# Patient Record
Sex: Female | Born: 1970 | Race: White | Hispanic: No | Smoking: Former smoker
Health system: Southern US, Community
[De-identification: ages and names within clinical notes are randomized; demographics above are authoritative.]

## PROBLEM LIST (undated history)

## (undated) DIAGNOSIS — M109 Gout, unspecified: Secondary | ICD-10-CM

## (undated) DIAGNOSIS — I639 Cerebral infarction, unspecified: Secondary | ICD-10-CM

## (undated) DIAGNOSIS — M1990 Unspecified osteoarthritis, unspecified site: Secondary | ICD-10-CM

## (undated) DIAGNOSIS — K2970 Gastritis, unspecified, without bleeding: Secondary | ICD-10-CM

## (undated) DIAGNOSIS — K589 Irritable bowel syndrome without diarrhea: Secondary | ICD-10-CM

## (undated) DIAGNOSIS — F419 Anxiety disorder, unspecified: Secondary | ICD-10-CM

## (undated) DIAGNOSIS — F32A Depression, unspecified: Secondary | ICD-10-CM

## (undated) DIAGNOSIS — K219 Gastro-esophageal reflux disease without esophagitis: Secondary | ICD-10-CM

## (undated) HISTORY — PX: AUGMENTATION MAMMAPLASTY: SUR837

## (undated) HISTORY — PX: COSMETIC SURGERY: SHX468

## (undated) HISTORY — PX: EXTERNAL FIXATION LEG: SHX1549

---

## 2011-01-26 ENCOUNTER — Emergency Department (HOSPITAL_COMMUNITY): Payer: BC Managed Care – PPO

## 2011-01-26 ENCOUNTER — Inpatient Hospital Stay (HOSPITAL_COMMUNITY)
Admission: EM | Admit: 2011-01-26 | Discharge: 2011-01-30 | DRG: 175 | Discharge status: Against Medical Advice | Payer: BC Managed Care – PPO | Attending: Internal Medicine | Admitting: Internal Medicine

## 2011-01-26 DIAGNOSIS — F411 Generalized anxiety disorder: Secondary | ICD-10-CM | POA: Diagnosis present

## 2011-01-26 DIAGNOSIS — K5289 Other specified noninfective gastroenteritis and colitis: Secondary | ICD-10-CM | POA: Diagnosis present

## 2011-01-26 DIAGNOSIS — F17200 Nicotine dependence, unspecified, uncomplicated: Secondary | ICD-10-CM | POA: Diagnosis present

## 2011-01-26 DIAGNOSIS — K922 Gastrointestinal hemorrhage, unspecified: Principal | ICD-10-CM | POA: Diagnosis present

## 2011-01-26 DIAGNOSIS — D649 Anemia, unspecified: Secondary | ICD-10-CM | POA: Diagnosis present

## 2011-01-26 LAB — BASIC METABOLIC PANEL
Calcium: 9 mg/dL (ref 8.4–10.5)
Chloride: 105 mEq/L (ref 96–112)
Creatinine, Ser: 0.67 mg/dL (ref 0.4–1.2)
GFR calc Af Amer: >60 mL/min (ref >60)
Sodium: 138 mEq/L (ref 135–145)

## 2011-01-26 LAB — HEMOGLOBIN AND HEMATOCRIT, BLOOD: Hemoglobin: 11.1 g/dL — ABNORMAL LOW (ref 12.0–15.0)

## 2011-01-26 LAB — DIFFERENTIAL
Eosinophils Absolute: 0.4 10*3/uL (ref 0.0–0.7)
Lymphocytes Relative: 14 % (ref 12–46)
Lymphs Abs: 1.4 10*3/uL (ref 0.7–4.0)
Monocytes Relative: 4 % (ref 3–12)
Neutro Abs: 7.5 10*3/uL (ref 1.7–7.7)
Neutrophils Relative %: 78 % — ABNORMAL HIGH (ref 43–77)

## 2011-01-26 LAB — CBC
Hemoglobin: 13.5 g/dL (ref 12.0–15.0)
MCV: 89 fL (ref 78.0–100.0)
Platelets: 263 10*3/uL (ref 150–400)
RBC: 4.1 MIL/uL (ref 3.87–5.11)
WBC: 9.7 10*3/uL (ref 4.0–10.5)

## 2011-01-26 LAB — ABO/RH: ABO/RH(D): O POS

## 2011-01-26 LAB — OCCULT BLOOD, POC DEVICE: Fecal Occult Bld: POSITIVE

## 2011-01-26 LAB — POCT PREGNANCY, URINE: Preg Test, Ur: NEGATIVE

## 2011-01-26 LAB — TYPE AND SCREEN

## 2011-01-26 MED ORDER — IOHEXOL 300 MG/ML  SOLN
100.0000 mL | Freq: Once | INTRAMUSCULAR | Status: AC | PRN
  Administered 2011-01-26: 100 mL via INTRAVENOUS

## 2011-01-27 LAB — TSH: TSH: 1.629 u[IU]/mL (ref 0.350–4.500)

## 2011-01-27 LAB — RENAL FUNCTION PANEL
Albumin: 3 g/dL — ABNORMAL LOW (ref 3.5–5.2)
BUN: 7 mg/dL (ref 6–23)
CO2: 25 mEq/L (ref 19–32)
Calcium: 8 mg/dL — ABNORMAL LOW (ref 8.4–10.5)
Chloride: 109 mEq/L (ref 96–112)
Creatinine, Ser: 0.76 mg/dL (ref 0.4–1.2)
GFR calc Af Amer: >60 mL/min (ref >60)
GFR calc non Af Amer: >60 mL/min (ref >60)
Glucose, Bld: 103 mg/dL — ABNORMAL HIGH (ref 70–99)
Phosphorus: 3 mg/dL (ref 2.3–4.6)
Potassium: 3.8 mEq/L (ref 3.5–5.1)
Sodium: 138 mEq/L (ref 135–145)

## 2011-01-27 LAB — HEMOGLOBIN AND HEMATOCRIT, BLOOD: HCT: 28.3 % — ABNORMAL LOW (ref 36.0–46.0)

## 2011-01-28 DIAGNOSIS — K625 Hemorrhage of anus and rectum: Secondary | ICD-10-CM

## 2011-01-28 DIAGNOSIS — R1011 Right upper quadrant pain: Secondary | ICD-10-CM

## 2011-01-28 DIAGNOSIS — R197 Diarrhea, unspecified: Secondary | ICD-10-CM

## 2011-01-28 DIAGNOSIS — R933 Abnormal findings on diagnostic imaging of other parts of digestive tract: Secondary | ICD-10-CM

## 2011-01-28 LAB — CBC
Hemoglobin: 10.1 g/dL — ABNORMAL LOW (ref 12.0–15.0)
RBC: 3.15 MIL/uL — ABNORMAL LOW (ref 3.87–5.11)

## 2011-01-30 ENCOUNTER — Other Ambulatory Visit: Payer: Self-pay | Admitting: Gastroenterology

## 2011-01-30 LAB — FECAL LACTOFERRIN, QUANT: Fecal Lactoferrin: POSITIVE

## 2011-01-30 LAB — CLOSTRIDIUM DIFFICILE BY PCR: Toxigenic C. Difficile by PCR: NEGATIVE

## 2011-02-23 NOTE — H&P (Signed)
Gloria Aguilar, Gloria Aguilar                 ACCOUNT NO.:  [id number]  MEDICAL RECORD NO.:  [id number]           PATIENT TYPE:  E  LOCATION:  [facility]                         FACILITY:  [facility]  PHYSICIAN:  Altha Harm, MDDATE OF BIRTH:  1971/06/15  DATE OF ADMISSION:  01/26/2011 DATE OF DISCHARGE:                             HISTORY & PHYSICAL   CHIEF COMPLAINT:  Hematochezia.  HISTORY OF PRESENT ILLNESS:  Gloria Aguilar is a 40 year old white female with a 2-day history of bright red blood per rectum.  The patient reports that she has had approximately 30 episodes of blood from the rectum whenever drinking any liquids or taken in any food.  The patient denies any fever or chills.  No nausea or vomiting.  She does report associated abdominal cramps.  She states that it was most intense when she had a piece of bread.  The patient states that since she has continued to have the bleeding per rectum, she has essentially stopped any oral intake.  The patient has not had any bloody stools and she has been here in the emergency room this morning.  The patient is unable to quantify the amount of blood.  However, her hemoglobin is relatively stable here at 13.5.  She has had no dizziness.  No loss of consciousness.  No seizure.  No syncope.  No chest pain.  No dysuria. She did state that after having several episodes of the blood per rectum, she felt generally weak.  PAST MEDICAL HISTORY:  Not significant for any chronic illnesses.  FAMILY HISTORY:  Significant for diverticulitis in her brother.  SOCIAL HISTORY:  She lives with her husband.  There is tobacco use of one and a half packs per day x23 years.  She occasionally uses alcohol socially and there is no illicit drug use.  CURRENT MEDICATIONS:  The patient has no current prescribed medications, but did take one Percocet at home from a previous prescription that she had to alleviate her pain and she also takes Tylenol.  ALLERGIES:  No known  drug allergies.  PRIMARY CARE PHYSICIAN:  The patient does not have a primary care physician and is unassigned.  REVIEW OF SYSTEMS:  All other systems negative.  STUDIES:  In the emergency room, a hemogram shows a white blood cell count of 9.7, hemoglobin of 13.5, hematocrit of 36.5, platelet count of 263.  Sodium 138, potassium 3.7, chloride 105, bicarb 24, BUN 11, creatinine 0.67.  Urine HCG is negative and the fecal occult blood test was positive.  CT of the abdomen and pelvis shows wall thickening in the descending colon and sigmoid colon, suspicious for nonspecific colitis. Dependent linear subsegmental atelectasis in the lung bases.  PHYSICAL EXAMINATION:  GENERAL:  The patient is a well-nourished, well- developed in no acute distress. VITAL SIGNS:  Temperature is 98.2, heart rate 105, blood pressure 112/76, respiratory rate 20, O2 sats are 98% on room air. HEENT:  She is normocephalic, atraumatic.  Pupils are equally round and reactive to light and accommodation.  Extraocular movements are intact. Oropharynx is moist.  No exudate, erythema, or lesions are noted. NECK:  Trachea is midline.  No masses.  No thyromegaly.  No JVD.  No carotid bruit. LUNGS:  Clear to auscultation.  No wheezing or rhonchi.  No accessory muscle use.  She has equal excursions bilaterally. CARDIOVASCULAR:  She has got a normal S1 and S2.  No murmurs, rubs, or gallops are noted.  PMI is nondisplaced.  No heaves or thrills on palpation. ABDOMEN:  Obese, soft, tenderness in the right and left lower quadrants. No masses.  No hepatosplenomegaly. NEUROLOGICAL:  She has got no focal neurological deficits.  Cranial nerves II through XII are grossly intact.  DTRs are 2+ bilaterally in the upper and lower extremities.  Strength is 4+/5 in bilateral upper and lower extremities. MUSCULOSKELETAL:  She has got no warmth, swelling or erythema around the joints. SKIN:  The patient has no rashes evident on her  skin. LYMPH NODE SURVEY:  She has got no cervical, axillary, or inguinal lymphadenopathy noted. PSYCHIATRIC:  She is alert and oriented x3, good insight and cognition, good recent to remote recall.  ASSESSMENT AND PLAN:  This a patient who presents with what appears to be acute colitis.  The differential includes inflammatory versus ischemia, less likely infectious.  I spoke with Dr. Christella Hartigan from [facility] who will see the patient in consultation.  In the meantime, we will get stools for cultures to look for enterohemorrhagic pathogens and also stool for C. diff.  The patient does not have any other constitutional signs of infection, thus I doubt that this would be infection.  I would expect the patient to be a lot more ill appearing if this were enterohemorrhagic.  The patient will be started on Flagyl and ciprofloxacin and I will defer any further management of this to Dr. Christella Hartigan at GI.  We will start the patient on clear liquids in the event that they want to do an endoscopy on her.  If they decide not to do endoscopy, I suspect that we can advance her diet as tolerated to a low- residue diet.  In terms of DVT prophylaxis, she will be on SCDs.  In terms of GI prophylaxis, at this time I see no indication for GI prophylaxis on this patient who has no upper GI symptoms and there is no benefit to Protonix in the lower GI.  In terms of her tobacco use disorder, I will offer a nicotine patch.  We will check hemoglobins and hematocrits every 8 hours while hydrating the patient.     Altha Harm, MD     MAM/MEDQ  D:  01/26/2011  T:  01/26/2011  Job:  [id number]  Electronically Signed by Marthann Schiller MD on 02/23/2011 08:45:38 PM

## 2011-02-24 NOTE — Discharge Summary (Signed)
Gloria Aguilar, Gloria Aguilar                 ACCOUNT NO.:  [id number]  MEDICAL RECORD NO.:  [id number]          PATIENT TYPE:  LOCATION:                                 FACILITY:  PHYSICIAN:  Marinda Elk, M.D.DATE OF BIRTH:  May 10, 1971  DATE OF ADMISSION: DATE OF DISCHARGE:                              DISCHARGE SUMMARY   PRIMARY CARE PHYSICIAN:  Unassigned.  The patient left AMA.  DIAGNOSIS:  Acute colitis.  MEDICATIONS:  None, the patient left AMA.  BRIEF ADMITTING HISTORY AND PHYSICAL:  This is a 40 year old female with past medical history of bright red blood per rectum.  The patient reported that she had been having approximately 38% of blood from the rectum whenever she drinks any liquids or takes any food.  The patient denies any fever or chills, nausea, or vomiting.  She reports associated abdominal cramps.  She states that it is most intense when she had a piece of bread.  The patient states that since she has continued to have bleeding per her rectum, she recently stopped all oral intake, so she came to the ED and she relates no dizziness, no loss of consciousness, no seizure, no syncope, chest pain, or dysuria.  Her hemoglobin is relatively stable at 13.3.  Please relate to dictation of January 26, 2011, for further details.  PHYSICAL EXAMINATION:  VITAL SIGNS:  Temperature 98, heart rate 105, blood pressure 112/76, respirations 20.  She was satting at 98% on room air. GENERAL:  She is normocephalic, atraumatic, in no acute distress. HEENT:  Trachea is midline.  No masses, no JVD. LUNGS:  Good air movement.  Clear to auscultation. ABDOMEN:  Positive bowel sounds, nontender, nondistended.  Abdomen is obese, soft, tender in the right and left lower quadrant.  No masses. NEUROLOGIC:  Nonfocal. MUSCULOSKELETAL:  Warm.  No swelling or erythema. SKIN:  No rashes present.  Lymphadenopathy nonpalpable.  LABORATORY DATA ON ADMISSION:  FOBT positive.  White count  9.7, hemoglobin of 13, platelet count of 263 with an ANC of 7.5.  Her sodium was 138, potassium 3.7, chloride 105, bicarb 24, glucose of 196, BUN of 11, creatinine 0.6, calcium 9.0.  She was typed and screened.  Her pregnancy test was negative.  BRIEF HOSPITAL COURSE:  She was admitted to the floor.  Her hemoglobin was monitor, it remained stable.  She was started on Cipro and Flagyl. She spiked no fever.  She did not have a white count.  CT scan showed wall thickening in the descending colon and sigmoid colon, suspicion for nonspecific colitis.  GI was consulted.  She had a flex sig.  There was no irritation, the mucosa was normal, so it was discussed with the patient.  We tried to avoid narcotics, and the patient did not like this, so she left AMA.  VITAL SIGNS ON THE DAY OF DISCHARGE:  Temperature 98, pulse 92, respiration 18.  She was satting 97% on room air and blood pressure 109/72.     Marinda Elk, M.D.     AF/MEDQ  D:  02/12/2011  T:  02/13/2011  Job:  [id number]  Electronically Signed by Lambert Keto M.D. on 02/24/2011 04:16:16 PM

## 2012-05-09 ENCOUNTER — Emergency Department: Payer: Self-pay | Admitting: Emergency Medicine

## 2014-08-30 ENCOUNTER — Emergency Department: Payer: Self-pay | Admitting: Emergency Medicine

## 2014-08-30 LAB — CBC WITH DIFFERENTIAL/PLATELET
BASOS PCT: 0.8 %
Basophil #: 0.1 10*3/uL (ref 0.0–0.1)
EOS ABS: 0.4 10*3/uL (ref 0.0–0.7)
Eosinophil %: 4.9 %
HCT: 37.6 % (ref 35.0–47.0)
HGB: 12.8 g/dL (ref 12.0–16.0)
LYMPHS ABS: 1.5 10*3/uL (ref 1.0–3.6)
Lymphocyte %: 19.8 %
MCH: 32.9 pg (ref 26.0–34.0)
MCHC: 34.1 g/dL (ref 32.0–36.0)
MCV: 96 fL (ref 80–100)
MONO ABS: 0.4 x10 3/mm (ref 0.2–0.9)
Monocyte %: 5.3 %
Neutrophil #: 5.3 10*3/uL (ref 1.4–6.5)
Neutrophil %: 69.2 %
Platelet: 252 10*3/uL (ref 150–440)
RBC: 3.9 10*6/uL (ref 3.80–5.20)
RDW: 13.1 % (ref 11.5–14.5)
WBC: 7.6 10*3/uL (ref 3.6–11.0)

## 2014-08-30 LAB — COMPREHENSIVE METABOLIC PANEL
ALBUMIN: 3.8 g/dL (ref 3.4–5.0)
ALT: 14 U/L
ANION GAP: 9 (ref 7–16)
Alkaline Phosphatase: 75 U/L
BUN: 12 mg/dL (ref 7–18)
Bilirubin,Total: 0.4 mg/dL (ref 0.2–1.0)
CHLORIDE: 105 mmol/L (ref 98–107)
CO2: 26 mmol/L (ref 21–32)
Calcium, Total: 8.5 mg/dL (ref 8.5–10.1)
Creatinine: 0.7 mg/dL (ref 0.60–1.30)
EGFR (African American): >60
Glucose: 97 mg/dL (ref 65–99)
Osmolality: 279 (ref 275–301)
Potassium: 3.8 mmol/L (ref 3.5–5.1)
SGOT(AST): 19 U/L (ref 15–37)
SODIUM: 140 mmol/L (ref 136–145)
Total Protein: 7.3 g/dL (ref 6.4–8.2)

## 2014-08-30 LAB — URINALYSIS, COMPLETE
Bilirubin,UR: NEGATIVE
Glucose,UR: NEGATIVE mg/dL (ref 0–75)
KETONE: NEGATIVE
Leukocyte Esterase: NEGATIVE
Nitrite: NEGATIVE
PH: 6 (ref 4.5–8.0)
Protein: NEGATIVE
RBC,UR: 1 /HPF (ref 0–5)
Specific Gravity: 1.01 (ref 1.003–1.030)
WBC UR: 2 /HPF (ref 0–5)

## 2019-11-16 DIAGNOSIS — R4701 Aphasia: Secondary | ICD-10-CM

## 2019-11-16 DIAGNOSIS — G8191 Hemiplegia, unspecified affecting right dominant side: Secondary | ICD-10-CM

## 2019-11-16 HISTORY — DX: Aphasia: R47.01

## 2019-11-16 HISTORY — DX: Hemiplegia, unspecified affecting right dominant side: G81.91

## 2019-12-07 DIAGNOSIS — J939 Pneumothorax, unspecified: Secondary | ICD-10-CM

## 2019-12-07 HISTORY — DX: Pneumothorax, unspecified: J93.9

## 2019-12-07 HISTORY — DX: Rider (driver) (passenger) of other motorcycle injured in unspecified traffic accident, initial encounter: V29.99XA

## 2019-12-08 DIAGNOSIS — S066XAA Traumatic subarachnoid hemorrhage with loss of consciousness status unknown, initial encounter: Secondary | ICD-10-CM

## 2019-12-08 DIAGNOSIS — R4701 Aphasia: Secondary | ICD-10-CM | POA: Insufficient documentation

## 2019-12-08 DIAGNOSIS — S82202A Unspecified fracture of shaft of left tibia, initial encounter for closed fracture: Secondary | ICD-10-CM

## 2019-12-08 DIAGNOSIS — I609 Nontraumatic subarachnoid hemorrhage, unspecified: Secondary | ICD-10-CM | POA: Insufficient documentation

## 2019-12-08 DIAGNOSIS — S0292XA Unspecified fracture of facial bones, initial encounter for closed fracture: Secondary | ICD-10-CM

## 2019-12-08 DIAGNOSIS — I7771 Dissection of carotid artery: Secondary | ICD-10-CM

## 2019-12-08 DIAGNOSIS — S7292XA Unspecified fracture of left femur, initial encounter for closed fracture: Secondary | ICD-10-CM

## 2019-12-08 DIAGNOSIS — S7291XA Unspecified fracture of right femur, initial encounter for closed fracture: Secondary | ICD-10-CM

## 2019-12-08 DIAGNOSIS — I729 Aneurysm of unspecified site: Secondary | ICD-10-CM | POA: Insufficient documentation

## 2019-12-08 HISTORY — PX: ORIF FEMUR FRACTURE: SHX2119

## 2019-12-08 HISTORY — DX: Unspecified fracture of left femur, initial encounter for closed fracture: S72.92XA

## 2019-12-08 HISTORY — DX: Unspecified fracture of right femur, initial encounter for closed fracture: S72.91XA

## 2019-12-08 HISTORY — DX: Unspecified fracture of facial bones, initial encounter for closed fracture: S02.92XA

## 2019-12-08 HISTORY — PX: EXTERNAL FIXATION LEG: SHX1549

## 2019-12-08 HISTORY — DX: Traumatic subarachnoid hemorrhage with loss of consciousness status unknown, initial encounter: S06.6XAA

## 2019-12-08 HISTORY — DX: Unspecified fracture of shaft of left tibia, initial encounter for closed fracture: S82.202A

## 2019-12-08 HISTORY — DX: Aneurysm of unspecified site: I72.9

## 2019-12-08 HISTORY — DX: Dissection of carotid artery: I77.71

## 2019-12-08 HISTORY — PX: ORIF DISTAL RADIUS FRACTURE: SUR927

## 2019-12-08 HISTORY — PX: DEBRIDEMENT LEG: SUR390

## 2019-12-08 HISTORY — PX: TIBIA FRACTURE SURGERY: SHX806

## 2019-12-08 HISTORY — PX: ORIF ULNAR FRACTURE: SHX5417

## 2019-12-11 DIAGNOSIS — S069XAA Unspecified intracranial injury with loss of consciousness status unknown, initial encounter: Secondary | ICD-10-CM | POA: Insufficient documentation

## 2019-12-11 DIAGNOSIS — S02609B Fracture of mandible, unspecified, initial encounter for open fracture: Secondary | ICD-10-CM

## 2019-12-11 HISTORY — DX: Fracture of mandible, unspecified, initial encounter for open fracture: S02.609B

## 2019-12-11 HISTORY — PX: ORIF TIBIA FRACTURE: SHX5416

## 2019-12-11 HISTORY — PX: ORIF DISTAL FEMUR FRACTURE: SUR926

## 2019-12-12 HISTORY — PX: ORIF MANDIBULAR FRACTURE: SHX2127

## 2019-12-12 HISTORY — PX: CLOSED REDUCTION CRANIOFACIAL SEPARATION: SUR254

## 2019-12-12 HISTORY — PX: TRACHEOSTOMY: SUR1362

## 2019-12-12 HISTORY — PX: LARYNGOSCOPY: SUR817

## 2019-12-12 HISTORY — PX: ESOPHAGOSCOPY W/ PERCUTANEOUS GASTROSTOMY TUBE PLACEMENT: SUR463

## 2019-12-29 HISTORY — PX: ORIF FEMUR FRACTURE: SHX2119

## 2020-02-01 HISTORY — PX: MULTIPLE TOOTH EXTRACTIONS: SHX2053

## 2020-02-04 HISTORY — PX: MANIPULATION KNEE JOINT: SUR841

## 2020-02-04 HISTORY — PX: EXTERNAL FIXATION REMOVAL: SHX5040

## 2020-03-27 ENCOUNTER — Other Ambulatory Visit: Payer: Self-pay

## 2020-03-27 ENCOUNTER — Encounter (HOSPITAL_COMMUNITY): Payer: Self-pay | Admitting: Emergency Medicine

## 2020-03-27 ENCOUNTER — Emergency Department (HOSPITAL_COMMUNITY): Payer: Medicaid Other

## 2020-03-27 ENCOUNTER — Emergency Department (HOSPITAL_COMMUNITY)
Admission: EM | Admit: 2020-03-27 | Discharge: 2020-03-27 | Disposition: A | Discharge status: Home / Self Care | Payer: Medicaid Other | Attending: Emergency Medicine | Admitting: Emergency Medicine

## 2020-03-27 DIAGNOSIS — S42034A Nondisplaced fracture of lateral end of right clavicle, initial encounter for closed fracture: Secondary | ICD-10-CM | POA: Insufficient documentation

## 2020-03-27 DIAGNOSIS — S81802A Unspecified open wound, left lower leg, initial encounter: Secondary | ICD-10-CM

## 2020-03-27 DIAGNOSIS — I639 Cerebral infarction, unspecified: Secondary | ICD-10-CM | POA: Insufficient documentation

## 2020-03-27 DIAGNOSIS — W06XXXA Fall from bed, initial encounter: Secondary | ICD-10-CM | POA: Insufficient documentation

## 2020-03-27 DIAGNOSIS — Z87820 Personal history of traumatic brain injury: Secondary | ICD-10-CM | POA: Insufficient documentation

## 2020-03-27 DIAGNOSIS — Y92003 Bedroom of unspecified non-institutional (private) residence as the place of occurrence of the external cause: Secondary | ICD-10-CM | POA: Diagnosis not present

## 2020-03-27 DIAGNOSIS — S4991XA Unspecified injury of right shoulder and upper arm, initial encounter: Secondary | ICD-10-CM | POA: Diagnosis present

## 2020-03-27 DIAGNOSIS — Y939 Activity, unspecified: Secondary | ICD-10-CM | POA: Insufficient documentation

## 2020-03-27 DIAGNOSIS — Y999 Unspecified external cause status: Secondary | ICD-10-CM | POA: Insufficient documentation
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
## 2020-04-01 LAB — AEROBIC/ANAEROBIC CULTURE W GRAM STAIN (SURGICAL/DEEP WOUND)

## 2020-04-02 ENCOUNTER — Telehealth: Payer: Self-pay | Admitting: *Deleted

## 2020-04-02 NOTE — Progress Notes (Signed)
ED Antimicrobial Stewardship Positive Culture Follow Up   Gloria Aguilar is an 49 y.o. female who presented to [facility] on 03/27/2020 with a chief complaint of  Chief Complaint  Patient presents with  . Leg Pain    Recent Results (from the past 720 hour(s))  Aerobic/Anaerobic Culture (surgical/deep wound)     Status: None   Collection Time: 03/27/20  4:51 PM   Specimen: Wound  Result Value Ref Range Status   Specimen Description WOUND LEFT LEG  Final   Special Requests NONE  Final   Gram Stain   Final    FEW WBC PRESENT, PREDOMINANTLY PMN RARE GRAM POSITIVE COCCI    Culture   Final    RARE STAPHYLOCOCCUS AUREUS NO ANAEROBES ISOLATED Performed at [facility], [address] [place], [place] [address]    Report Status 04/01/2020 FINAL  Final   Organism ID, Bacteria STAPHYLOCOCCUS AUREUS  Final      Susceptibility   Staphylococcus aureus - MIC*    CIPROFLOXACIN <=0.5 SENSITIVE Sensitive     ERYTHROMYCIN <=0.25 SENSITIVE Sensitive     GENTAMICIN <=0.5 SENSITIVE Sensitive     OXACILLIN <=0.25 SENSITIVE Sensitive     TETRACYCLINE <=1 SENSITIVE Sensitive     VANCOMYCIN 1 SENSITIVE Sensitive     TRIMETH/SULFA <=10 SENSITIVE Sensitive     CLINDAMYCIN <=0.25 SENSITIVE Sensitive     RIFAMPIN <=0.5 SENSITIVE Sensitive     Inducible Clindamycin NEGATIVE Sensitive     * RARE STAPHYLOCOCCUS AUREUS    [x]  Patient discharged originally without antimicrobial agent and treatment is now indicated  New antibiotic prescription: cephalexin 500mg  every 6 hours for 7 days   ED Provider: PA-C   [place] 04/02/2020, 9:24 AM Clinical Pharmacist Monday - Friday phone -  [phone] Saturday - Sunday phone - [phone]

## 2020-04-02 NOTE — Telephone Encounter (Signed)
Post ED Visit - Positive Culture Follow-up: Unsuccessful Patient Follow-up  Culture assessed and recommendations reviewed by:  []  , Pharm.D. []  Enzo Bi, Pharm.D., BCPS AQ-ID []  , Pharm.D., BCPS []  Celedonio Miyamoto, Pharm.D., BCPS []  [place], Garvin Fila.D., BCPS, AAHIVP []  , Pharm.D., BCPS, AAHIVP []  Georgina Pillion, PharmD []  , PharmD, BCPS  Positive wound culture  [x]  Patient discharged without antimicrobial prescription and treatment is now indicated []  Organism is resistant to prescribed ED discharge antimicrobial []  Patient with positive blood cultures  Plan:  Start Cephalexin 500mg  I q 6 hours x 7 days, [place], PA-C  Unable to contact patient after 3 attempts, letter will be sent to address on file  [address] 04/02/2020, 10:13 AM

## 2020-06-17 ENCOUNTER — Ambulatory Visit (HOSPITAL_COMMUNITY): Payer: Medicaid Other | Admitting: Occupational Therapy

## 2020-06-19 ENCOUNTER — Other Ambulatory Visit: Payer: Self-pay

## 2020-06-19 ENCOUNTER — Encounter (HOSPITAL_COMMUNITY): Payer: Self-pay | Admitting: Physical Therapy

## 2020-06-19 ENCOUNTER — Ambulatory Visit (HOSPITAL_COMMUNITY): Payer: Medicaid Other | Attending: Family Medicine | Admitting: Physical Therapy

## 2020-06-19 ENCOUNTER — Telehealth (HOSPITAL_COMMUNITY): Payer: Self-pay | Admitting: Physical Therapy

## 2020-06-19 DIAGNOSIS — R4701 Aphasia: Secondary | ICD-10-CM | POA: Insufficient documentation

## 2020-06-19 DIAGNOSIS — Z789 Other specified health status: Secondary | ICD-10-CM | POA: Insufficient documentation

## 2020-06-19 DIAGNOSIS — S069X9D Unspecified intracranial injury with loss of consciousness of unspecified duration, subsequent encounter: Secondary | ICD-10-CM | POA: Diagnosis not present

## 2020-06-19 DIAGNOSIS — R41841 Cognitive communication deficit: Secondary | ICD-10-CM | POA: Diagnosis present

## 2020-06-19 DIAGNOSIS — R6889 Other general symptoms and signs: Secondary | ICD-10-CM

## 2020-06-19 DIAGNOSIS — M6281 Muscle weakness (generalized): Secondary | ICD-10-CM | POA: Diagnosis present

## 2020-06-19 NOTE — Telephone Encounter (Signed)
Faxed [facility] -referral to sign and fax back for Speech eval and tx as CR requested. Pt is going to [facility] for ST and coming here for PT/OT.
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
## 2020-06-25 HISTORY — PX: MULTIPLE EXTRACTIONS WITH ALVEOLOPLASTY: SHX5342

## 2020-06-25 HISTORY — PX: FACE HARDWARE REMOVAL: SUR1123

## 2020-07-02 ENCOUNTER — Other Ambulatory Visit: Payer: Self-pay

## 2020-07-02 ENCOUNTER — Ambulatory Visit (HOSPITAL_COMMUNITY): Payer: Medicaid Other | Admitting: Occupational Therapy

## 2020-07-02 ENCOUNTER — Encounter (HOSPITAL_COMMUNITY): Payer: Self-pay | Admitting: Occupational Therapy

## 2020-07-02 DIAGNOSIS — S069X9D Unspecified intracranial injury with loss of consciousness of unspecified duration, subsequent encounter: Secondary | ICD-10-CM | POA: Diagnosis not present

## 2020-07-02 DIAGNOSIS — Z789 Other specified health status: Secondary | ICD-10-CM
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
## 2020-07-03 ENCOUNTER — Telehealth (HOSPITAL_COMMUNITY): Payer: Self-pay | Admitting: Occupational Therapy

## 2020-07-03 ENCOUNTER — Encounter (HOSPITAL_COMMUNITY): Payer: Self-pay | Admitting: Physical Therapy

## 2020-07-03 ENCOUNTER — Ambulatory Visit (HOSPITAL_COMMUNITY): Payer: Medicaid Other | Admitting: Physical Therapy

## 2020-07-03 DIAGNOSIS — R6889 Other general symptoms and signs: Secondary | ICD-10-CM

## 2020-07-03 DIAGNOSIS — S069X9D Unspecified intracranial injury with loss of consciousness of unspecified duration, subsequent encounter: Secondary | ICD-10-CM

## 2020-07-03 DIAGNOSIS — M6281 Muscle weakness (generalized): Secondary | ICD-10-CM

## 2020-07-03 DIAGNOSIS — Z789 Other specified health status: Secondary | ICD-10-CM

## 2020-07-03 NOTE — Telephone Encounter (Signed)
UHC Community no auth required for patient over 21 at this time $3.00 co-pay S/w Annice Pih [id number] Pt's policy if a fascial year 05-2020-01/12/2021 PT/OT/ST are combine -27 visits per year. At this time none are showing used however, there are 12 ST visits approved in the sytem for [facility] on 05/15/2020.Jeanene Erb [employer] l/m for Hulda Marin to call us back and let us know how many if any ST visits were used
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
## 2020-07-08 ENCOUNTER — Encounter (HOSPITAL_COMMUNITY): Payer: Self-pay | Admitting: Speech Pathology

## 2020-07-08 ENCOUNTER — Ambulatory Visit (HOSPITAL_COMMUNITY): Payer: Medicaid Other | Admitting: Speech Pathology

## 2020-07-08 ENCOUNTER — Other Ambulatory Visit: Payer: Self-pay

## 2020-07-08 DIAGNOSIS — R4701 Aphasia: Secondary | ICD-10-CM

## 2020-07-08 DIAGNOSIS — R41841 Cognitive communication deficit: Secondary | ICD-10-CM

## 2020-07-08 DIAGNOSIS — S069X9D Unspecified intracranial injury with loss of consciousness of unspecified duration, subsequent encounter: Secondary | ICD-10-CM | POA: Diagnosis not present
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
## 2020-07-11 ENCOUNTER — Encounter (HOSPITAL_COMMUNITY): Payer: Medicaid Other

## 2020-07-11 ENCOUNTER — Telehealth (HOSPITAL_COMMUNITY): Payer: Self-pay

## 2020-07-11 NOTE — Telephone Encounter (Signed)
Pt's hubby is waiting on COVID Text results -cx today's and they will be in contact with [place]

## 2020-07-14 ENCOUNTER — Ambulatory Visit (HOSPITAL_COMMUNITY): Payer: Medicaid Other | Admitting: Speech Pathology

## 2020-07-14 ENCOUNTER — Telehealth (HOSPITAL_COMMUNITY): Payer: Self-pay | Admitting: Speech Pathology

## 2020-07-14 NOTE — Telephone Encounter (Signed)
Called to check on patient due to missed apptment and remind them of their apptment tomorrow 9/1. Mailbox full and could not l/m

## 2020-07-15 ENCOUNTER — Telehealth (HOSPITAL_COMMUNITY): Payer: Self-pay

## 2020-07-15 ENCOUNTER — Encounter (HOSPITAL_COMMUNITY): Payer: Medicaid Other | Admitting: Physical Therapy

## 2020-07-15 ENCOUNTER — Telehealth (HOSPITAL_COMMUNITY): Payer: Self-pay | Admitting: Speech Pathology

## 2020-07-15 ENCOUNTER — Encounter (HOSPITAL_COMMUNITY): Payer: Medicaid Other

## 2020-07-15 NOTE — Telephone Encounter (Signed)
Pt's husband called to cx today - his covid test came back positivie. He will call us back to keep [place] updated.

## 2020-07-15 NOTE — Telephone Encounter (Signed)
Called pt's husband Homero Fellers) he agreed to cx apptments -quarantine patient for 14 days. They will return on 07/29/20 which should be passed the 14 day quarantine. (Test results on 07/12/20.

## 2020-07-17 ENCOUNTER — Ambulatory Visit (HOSPITAL_COMMUNITY): Payer: Medicaid Other | Admitting: Speech Pathology

## 2020-07-17 ENCOUNTER — Encounter (HOSPITAL_COMMUNITY): Payer: Medicaid Other | Admitting: Physical Therapy

## 2020-07-22 ENCOUNTER — Encounter (HOSPITAL_COMMUNITY): Payer: Medicaid Other

## 2020-07-22 ENCOUNTER — Encounter (HOSPITAL_COMMUNITY): Payer: Medicaid Other | Admitting: Physical Therapy

## 2020-07-24 ENCOUNTER — Encounter (HOSPITAL_COMMUNITY): Payer: Medicaid Other | Admitting: Physical Therapy

## 2020-07-24 ENCOUNTER — Ambulatory Visit (HOSPITAL_COMMUNITY): Payer: Medicaid Other | Admitting: Speech Pathology

## 2020-07-29 ENCOUNTER — Telehealth (HOSPITAL_COMMUNITY): Payer: Self-pay | Admitting: Speech Pathology

## 2020-07-29 ENCOUNTER — Ambulatory Visit (HOSPITAL_COMMUNITY): Payer: Medicaid Other

## 2020-07-29 ENCOUNTER — Ambulatory Visit (HOSPITAL_COMMUNITY): Payer: Medicaid Other | Admitting: Speech Pathology

## 2020-07-29 NOTE — Telephone Encounter (Signed)
pt cancelled appt for toady because she is in a lot of pain

## 2020-07-30 ENCOUNTER — Ambulatory Visit (HOSPITAL_COMMUNITY): Payer: Medicaid Other | Admitting: Speech Pathology

## 2020-07-30 ENCOUNTER — Telehealth (HOSPITAL_COMMUNITY): Payer: Self-pay | Admitting: Speech Pathology

## 2020-07-30 NOTE — Telephone Encounter (Signed)
Telephone Call  SLP called Pt/caregiver due to "no show" for today's appointment. Gloria Aguilar indicated that she was in too much pain to come to therapy (her knee) and she will be seeing her sports medicine doctor later this afternoon. I asked him to call us if they needed to cancel any other appointments.  Thank you,  Havery Moros, CCC-SLP [phone]

## 2020-07-31 ENCOUNTER — Other Ambulatory Visit: Payer: Self-pay

## 2020-07-31 ENCOUNTER — Ambulatory Visit (HOSPITAL_COMMUNITY): Payer: Medicaid Other | Admitting: Specialist

## 2020-07-31 ENCOUNTER — Telehealth (HOSPITAL_COMMUNITY): Payer: Self-pay

## 2020-07-31 ENCOUNTER — Encounter (HOSPITAL_COMMUNITY): Payer: Self-pay

## 2020-07-31 ENCOUNTER — Ambulatory Visit (HOSPITAL_COMMUNITY): Payer: Medicaid Other | Attending: Family Medicine

## 2020-07-31 DIAGNOSIS — M6281 Muscle weakness (generalized): Secondary | ICD-10-CM | POA: Diagnosis not present

## 2020-07-31 DIAGNOSIS — Z789 Other specified health status: Secondary | ICD-10-CM

## 2020-07-31 DIAGNOSIS — R41841 Cognitive communication deficit: Secondary | ICD-10-CM | POA: Diagnosis present

## 2020-07-31 DIAGNOSIS — S069X9D Unspecified intracranial injury with loss of consciousness of unspecified duration, subsequent encounter: Secondary | ICD-10-CM

## 2020-07-31 DIAGNOSIS — R4701 Aphasia: Secondary | ICD-10-CM | POA: Insufficient documentation

## 2020-07-31 DIAGNOSIS — R6889 Other general symptoms and signs: Secondary | ICD-10-CM
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
## 2020-07-31 NOTE — Telephone Encounter (Signed)
Called and spoke to [employer] concerning apts later today, asking if she can come in earlier for opening.  Homero Fellers indicated  she has been released for weight bearing on left and continues to have some pain but will make try to make it to apt today.    Becky Sax, LPTA/CLT; [place]e [place]ack [phone]

## 2020-08-01 ENCOUNTER — Encounter (HOSPITAL_COMMUNITY): Payer: Self-pay | Admitting: Specialist
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
## 2020-08-05 ENCOUNTER — Encounter (HOSPITAL_COMMUNITY): Payer: Medicaid Other | Admitting: Physical Therapy

## 2020-08-05 ENCOUNTER — Encounter (HOSPITAL_COMMUNITY): Payer: Medicaid Other

## 2020-08-05 ENCOUNTER — Telehealth (HOSPITAL_COMMUNITY): Payer: Self-pay | Admitting: Physical Therapy

## 2020-08-05 ENCOUNTER — Ambulatory Visit (HOSPITAL_COMMUNITY): Payer: Medicaid Other | Admitting: Speech Pathology

## 2020-08-05 NOTE — Telephone Encounter (Signed)
pt cancelled appts for today because she is in a great deal of pain

## 2020-08-07 ENCOUNTER — Ambulatory Visit (HOSPITAL_COMMUNITY): Payer: Medicaid Other | Admitting: Speech Pathology

## 2020-08-07 ENCOUNTER — Encounter (HOSPITAL_COMMUNITY): Payer: Self-pay | Admitting: Speech Pathology

## 2020-08-07 ENCOUNTER — Other Ambulatory Visit: Payer: Self-pay

## 2020-08-07 ENCOUNTER — Encounter (HOSPITAL_COMMUNITY): Payer: Medicaid Other | Admitting: Specialist

## 2020-08-07 ENCOUNTER — Encounter (HOSPITAL_COMMUNITY): Payer: Medicaid Other | Admitting: Physical Therapy

## 2020-08-07 DIAGNOSIS — R41841 Cognitive communication deficit: Secondary | ICD-10-CM

## 2020-08-07 DIAGNOSIS — R4701 Aphasia: Secondary | ICD-10-CM

## 2020-08-07 DIAGNOSIS — M6281 Muscle weakness (generalized): Secondary | ICD-10-CM | POA: Diagnosis not present
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
## 2020-08-12 ENCOUNTER — Ambulatory Visit (HOSPITAL_COMMUNITY): Payer: Medicaid Other | Admitting: Speech Pathology

## 2020-08-12 ENCOUNTER — Encounter (HOSPITAL_COMMUNITY): Payer: Self-pay | Admitting: Physical Therapy

## 2020-08-12 ENCOUNTER — Encounter (HOSPITAL_COMMUNITY): Payer: Self-pay | Admitting: Speech Pathology

## 2020-08-12 ENCOUNTER — Other Ambulatory Visit: Payer: Self-pay

## 2020-08-12 ENCOUNTER — Ambulatory Visit (HOSPITAL_COMMUNITY): Payer: Medicaid Other | Admitting: Physical Therapy

## 2020-08-12 ENCOUNTER — Ambulatory Visit (HOSPITAL_COMMUNITY): Payer: Medicaid Other | Admitting: Specialist

## 2020-08-12 DIAGNOSIS — R4701 Aphasia: Secondary | ICD-10-CM

## 2020-08-12 DIAGNOSIS — R6889 Other general symptoms and signs: Secondary | ICD-10-CM

## 2020-08-12 DIAGNOSIS — M6281 Muscle weakness (generalized): Secondary | ICD-10-CM

## 2020-08-12 DIAGNOSIS — R41841 Cognitive communication deficit: Secondary | ICD-10-CM
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
## 2020-08-13 ENCOUNTER — Encounter (HOSPITAL_COMMUNITY): Payer: Self-pay | Admitting: Specialist
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
## 2020-08-14 ENCOUNTER — Ambulatory Visit (HOSPITAL_COMMUNITY): Payer: Medicaid Other | Admitting: Specialist

## 2020-08-14 ENCOUNTER — Ambulatory Visit (HOSPITAL_COMMUNITY): Payer: Medicaid Other

## 2020-08-14 ENCOUNTER — Encounter (HOSPITAL_COMMUNITY): Payer: Self-pay

## 2020-08-14 ENCOUNTER — Ambulatory Visit (HOSPITAL_COMMUNITY): Payer: Medicaid Other | Admitting: Speech Pathology

## 2020-08-14 ENCOUNTER — Other Ambulatory Visit: Payer: Self-pay

## 2020-08-14 DIAGNOSIS — S069X9D Unspecified intracranial injury with loss of consciousness of unspecified duration, subsequent encounter: Secondary | ICD-10-CM

## 2020-08-14 DIAGNOSIS — M6281 Muscle weakness (generalized): Secondary | ICD-10-CM | POA: Diagnosis not present

## 2020-08-14 DIAGNOSIS — R6889 Other general symptoms and signs: Secondary | ICD-10-CM
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
## 2020-08-14 NOTE — Patient Instructions (Signed)
Dressing: Putting on T-Shirt    With shirt facing down, guide affected arm from bottom opening through sleeve. Pull shirt up to shoulder and put other arm through sleeve. Hold back of neck opening to pull up and over head. Pull shirt down over trunk.  Copyright  VHI. All rights reserved.

## 2020-08-15 ENCOUNTER — Encounter (HOSPITAL_COMMUNITY): Payer: Self-pay | Admitting: Specialist
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
## 2020-08-18 ENCOUNTER — Ambulatory Visit (HOSPITAL_COMMUNITY): Payer: Medicaid Other | Admitting: Speech Pathology

## 2020-08-19 ENCOUNTER — Other Ambulatory Visit: Payer: Self-pay

## 2020-08-19 ENCOUNTER — Encounter (HOSPITAL_COMMUNITY): Payer: Self-pay | Admitting: Physical Therapy

## 2020-08-19 ENCOUNTER — Encounter (HOSPITAL_COMMUNITY): Payer: Self-pay | Admitting: Speech Pathology

## 2020-08-19 ENCOUNTER — Ambulatory Visit (HOSPITAL_COMMUNITY): Payer: Medicaid Other | Attending: Family Medicine | Admitting: Speech Pathology

## 2020-08-19 ENCOUNTER — Ambulatory Visit (HOSPITAL_COMMUNITY): Payer: Medicaid Other | Admitting: Physical Therapy

## 2020-08-19 DIAGNOSIS — M6281 Muscle weakness (generalized): Secondary | ICD-10-CM

## 2020-08-19 DIAGNOSIS — R4701 Aphasia: Secondary | ICD-10-CM

## 2020-08-19 DIAGNOSIS — R41841 Cognitive communication deficit: Secondary | ICD-10-CM

## 2020-08-19 DIAGNOSIS — R6889 Other general symptoms and signs: Secondary | ICD-10-CM | POA: Insufficient documentation

## 2020-08-19 DIAGNOSIS — S069X9D Unspecified intracranial injury with loss of consciousness of unspecified duration, subsequent encounter: Secondary | ICD-10-CM | POA: Diagnosis present
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
## 2020-08-21 ENCOUNTER — Ambulatory Visit (HOSPITAL_COMMUNITY): Payer: Medicaid Other | Admitting: Speech Pathology

## 2020-08-21 ENCOUNTER — Encounter (HOSPITAL_COMMUNITY): Payer: Medicaid Other | Admitting: Physical Therapy

## 2020-08-22 ENCOUNTER — Ambulatory Visit (HOSPITAL_COMMUNITY): Payer: Medicaid Other | Admitting: Physical Therapy

## 2020-08-22 ENCOUNTER — Telehealth (HOSPITAL_COMMUNITY): Payer: Self-pay | Admitting: Physical Therapy

## 2020-08-22 NOTE — Telephone Encounter (Signed)
pt cancelled appt for today because she is in pain 

## 2020-08-26 ENCOUNTER — Ambulatory Visit (HOSPITAL_COMMUNITY): Payer: Medicaid Other | Admitting: Physical Therapy

## 2020-08-26 ENCOUNTER — Other Ambulatory Visit: Payer: Self-pay

## 2020-08-26 ENCOUNTER — Encounter (HOSPITAL_COMMUNITY): Payer: Self-pay

## 2020-08-26 ENCOUNTER — Encounter (HOSPITAL_COMMUNITY): Payer: Medicaid Other | Admitting: Occupational Therapy

## 2020-08-26 ENCOUNTER — Ambulatory Visit (HOSPITAL_COMMUNITY): Payer: Medicaid Other | Admitting: Speech Pathology

## 2020-08-26 ENCOUNTER — Telehealth (HOSPITAL_COMMUNITY): Payer: Self-pay | Admitting: Speech Pathology

## 2020-08-26 ENCOUNTER — Encounter (HOSPITAL_COMMUNITY): Payer: Self-pay | Admitting: Physical Therapy

## 2020-08-26 DIAGNOSIS — R41841 Cognitive communication deficit: Secondary | ICD-10-CM | POA: Diagnosis not present

## 2020-08-26 DIAGNOSIS — S069X9D Unspecified intracranial injury with loss of consciousness of unspecified duration, subsequent encounter: Secondary | ICD-10-CM

## 2020-08-26 DIAGNOSIS — R6889 Other general symptoms and signs: Secondary | ICD-10-CM

## 2020-08-26 DIAGNOSIS — M6281 Muscle weakness (generalized): Secondary | ICD-10-CM
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
## 2020-08-26 NOTE — Telephone Encounter (Signed)
pt cancelled appt for today becuase she was in pain

## 2020-08-28 ENCOUNTER — Ambulatory Visit (HOSPITAL_COMMUNITY): Payer: Medicaid Other | Admitting: Speech Pathology

## 2020-08-28 ENCOUNTER — Ambulatory Visit (HOSPITAL_COMMUNITY): Payer: Medicaid Other | Admitting: Physical Therapy

## 2020-08-28 ENCOUNTER — Encounter (HOSPITAL_COMMUNITY): Payer: Medicaid Other | Admitting: Occupational Therapy

## 2020-09-02 ENCOUNTER — Encounter (HOSPITAL_COMMUNITY): Payer: Self-pay

## 2020-09-02 ENCOUNTER — Encounter (HOSPITAL_COMMUNITY): Payer: Medicaid Other | Admitting: Occupational Therapy

## 2020-09-02 ENCOUNTER — Ambulatory Visit (HOSPITAL_COMMUNITY): Payer: Medicaid Other | Admitting: Speech Pathology

## 2020-09-02 ENCOUNTER — Other Ambulatory Visit: Payer: Self-pay

## 2020-09-02 ENCOUNTER — Ambulatory Visit (HOSPITAL_COMMUNITY): Payer: Medicaid Other | Admitting: Physical Therapy

## 2020-09-02 ENCOUNTER — Encounter (HOSPITAL_COMMUNITY): Payer: Self-pay | Admitting: Speech Pathology

## 2020-09-02 DIAGNOSIS — R4701 Aphasia: Secondary | ICD-10-CM

## 2020-09-02 DIAGNOSIS — R41841 Cognitive communication deficit: Secondary | ICD-10-CM
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
## 2020-09-04 ENCOUNTER — Encounter (HOSPITAL_COMMUNITY): Payer: Medicaid Other

## 2020-09-04 ENCOUNTER — Telehealth (HOSPITAL_COMMUNITY): Payer: Self-pay | Admitting: Physical Therapy

## 2020-09-04 ENCOUNTER — Ambulatory Visit (HOSPITAL_COMMUNITY): Payer: Medicaid Other | Admitting: Physical Therapy

## 2020-09-04 ENCOUNTER — Ambulatory Visit (HOSPITAL_COMMUNITY): Payer: Medicaid Other | Admitting: Speech Pathology

## 2020-09-04 NOTE — Telephone Encounter (Signed)
Patient no show, called patient and they did not answer. Unable to leave message as voicemail was full.   11:51 AM, 09/04/20 Wyman Songster PT, DPT Physical Therapist at [facility]

## 2020-09-09 ENCOUNTER — Telehealth (HOSPITAL_COMMUNITY): Payer: Self-pay | Admitting: Physical Therapy

## 2020-09-09 ENCOUNTER — Encounter (HOSPITAL_COMMUNITY): Payer: Medicaid Other

## 2020-09-09 ENCOUNTER — Ambulatory Visit (HOSPITAL_COMMUNITY): Payer: Medicaid Other | Admitting: Physical Therapy

## 2020-09-09 ENCOUNTER — Ambulatory Visit (HOSPITAL_COMMUNITY): Payer: Medicaid Other | Admitting: Speech Pathology

## 2020-09-09 NOTE — Telephone Encounter (Signed)
pt's husband called to cx today's appt due to they will be at their attorney's office for an appt.

## 2020-09-11 ENCOUNTER — Ambulatory Visit (HOSPITAL_COMMUNITY): Payer: Medicaid Other | Admitting: Speech Pathology

## 2020-09-11 ENCOUNTER — Encounter (HOSPITAL_COMMUNITY): Payer: Medicaid Other

## 2020-09-11 ENCOUNTER — Ambulatory Visit (HOSPITAL_COMMUNITY): Payer: Medicaid Other | Admitting: Physical Therapy

## 2020-12-25 HISTORY — PX: KNEE ARTHROSCOPY W/ ACL RECONSTRUCTION: SHX1858

## 2020-12-25 HISTORY — PX: ANKLE HARDWARE REMOVAL: SHX1149

## 2020-12-29 DIAGNOSIS — Z8673 Personal history of transient ischemic attack (TIA), and cerebral infarction without residual deficits: Secondary | ICD-10-CM | POA: Insufficient documentation

## 2021-02-05 ENCOUNTER — Telehealth (HOSPITAL_COMMUNITY): Payer: Self-pay | Admitting: Speech Pathology

## 2021-02-05 ENCOUNTER — Ambulatory Visit (HOSPITAL_COMMUNITY): Payer: Medicaid Other | Admitting: Speech Pathology

## 2021-02-05 NOTE — Telephone Encounter (Signed)
pt cancelled appt for today because the pt is not feeling well

## 2021-02-11 ENCOUNTER — Ambulatory Visit (HOSPITAL_COMMUNITY): Payer: Medicaid Other | Admitting: Occupational Therapy

## 2021-02-11 ENCOUNTER — Telehealth (HOSPITAL_COMMUNITY): Payer: Self-pay | Admitting: Physical Therapy

## 2021-02-11 ENCOUNTER — Ambulatory Visit (HOSPITAL_COMMUNITY): Payer: Medicaid Other | Admitting: Physical Therapy

## 2021-02-11 NOTE — Telephone Encounter (Signed)
pt cancelled appts for today because she was in too much pain

## 2021-02-17 ENCOUNTER — Ambulatory Visit (HOSPITAL_COMMUNITY): Payer: Medicaid Other

## 2021-02-17 ENCOUNTER — Ambulatory Visit (HOSPITAL_COMMUNITY): Payer: Medicaid Other | Admitting: Physical Therapy

## 2021-02-17 ENCOUNTER — Ambulatory Visit (HOSPITAL_COMMUNITY): Payer: Medicaid Other | Attending: Speech Pathology | Admitting: Speech Pathology

## 2021-02-19 ENCOUNTER — Ambulatory Visit (HOSPITAL_COMMUNITY): Payer: Medicaid Other

## 2021-02-19 ENCOUNTER — Ambulatory Visit (HOSPITAL_COMMUNITY): Payer: Medicaid Other | Admitting: Physical Therapy

## 2021-02-24 ENCOUNTER — Ambulatory Visit (HOSPITAL_COMMUNITY): Payer: Medicaid Other | Admitting: Speech Pathology

## 2021-02-24 ENCOUNTER — Ambulatory Visit (HOSPITAL_COMMUNITY): Payer: Medicaid Other | Admitting: Physical Therapy

## 2021-02-24 ENCOUNTER — Ambulatory Visit (HOSPITAL_COMMUNITY): Payer: Medicaid Other

## 2021-02-26 ENCOUNTER — Ambulatory Visit (HOSPITAL_COMMUNITY): Payer: Medicaid Other | Admitting: Physical Therapy

## 2021-02-26 ENCOUNTER — Ambulatory Visit (HOSPITAL_COMMUNITY): Payer: Medicaid Other

## 2021-03-03 ENCOUNTER — Ambulatory Visit (HOSPITAL_COMMUNITY): Payer: Medicaid Other

## 2021-03-03 ENCOUNTER — Ambulatory Visit (HOSPITAL_COMMUNITY): Payer: Medicaid Other | Admitting: Physical Therapy

## 2021-03-03 ENCOUNTER — Ambulatory Visit (HOSPITAL_COMMUNITY): Payer: Medicaid Other | Admitting: Speech Pathology

## 2021-03-05 ENCOUNTER — Ambulatory Visit (HOSPITAL_COMMUNITY): Payer: Medicaid Other | Admitting: Physical Therapy

## 2021-03-05 ENCOUNTER — Ambulatory Visit (HOSPITAL_COMMUNITY): Payer: Medicaid Other

## 2021-03-10 ENCOUNTER — Ambulatory Visit (HOSPITAL_COMMUNITY): Payer: Medicaid Other

## 2021-03-10 ENCOUNTER — Ambulatory Visit (HOSPITAL_COMMUNITY): Payer: Medicaid Other | Admitting: Physical Therapy

## 2021-03-10 ENCOUNTER — Ambulatory Visit (HOSPITAL_COMMUNITY): Payer: Medicaid Other | Admitting: Speech Pathology

## 2021-03-12 ENCOUNTER — Ambulatory Visit (HOSPITAL_COMMUNITY): Payer: Medicaid Other

## 2021-03-12 ENCOUNTER — Ambulatory Visit (HOSPITAL_COMMUNITY): Payer: Medicaid Other | Admitting: Physical Therapy

## 2021-03-17 ENCOUNTER — Ambulatory Visit (HOSPITAL_COMMUNITY): Payer: Medicaid Other | Admitting: Speech Pathology

## 2021-03-17 ENCOUNTER — Ambulatory Visit (HOSPITAL_COMMUNITY): Payer: Medicaid Other | Admitting: Physical Therapy

## 2021-03-24 ENCOUNTER — Ambulatory Visit (HOSPITAL_COMMUNITY): Payer: Medicaid Other | Admitting: Speech Pathology

## 2021-03-24 ENCOUNTER — Ambulatory Visit (HOSPITAL_COMMUNITY): Payer: Medicaid Other

## 2021-03-31 ENCOUNTER — Ambulatory Visit (HOSPITAL_COMMUNITY): Payer: Medicaid Other | Attending: Physical Medicine and Rehabilitation | Admitting: Occupational Therapy

## 2021-03-31 ENCOUNTER — Other Ambulatory Visit: Payer: Self-pay

## 2021-03-31 ENCOUNTER — Encounter (HOSPITAL_COMMUNITY): Payer: Self-pay | Admitting: Occupational Therapy

## 2021-03-31 DIAGNOSIS — R6889 Other general symptoms and signs: Secondary | ICD-10-CM | POA: Diagnosis present

## 2021-03-31 DIAGNOSIS — M6281 Muscle weakness (generalized): Secondary | ICD-10-CM

## 2021-03-31 DIAGNOSIS — Z789 Other specified health status: Secondary | ICD-10-CM | POA: Diagnosis not present
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
## 2021-04-01 NOTE — Addendum Note (Signed)
Addended by: Ezra Sites A on: 04/01/2021 10:19 AM   Modules accepted: Orders

## 2021-04-21 ENCOUNTER — Other Ambulatory Visit: Payer: Self-pay

## 2021-04-21 ENCOUNTER — Ambulatory Visit (HOSPITAL_COMMUNITY): Payer: Medicaid Other | Admitting: Physical Therapy

## 2021-04-21 ENCOUNTER — Ambulatory Visit (HOSPITAL_COMMUNITY): Payer: Medicaid Other | Attending: Physical Medicine and Rehabilitation | Admitting: Speech Pathology

## 2021-04-21 ENCOUNTER — Encounter (HOSPITAL_COMMUNITY): Payer: Self-pay | Admitting: Speech Pathology

## 2021-04-21 ENCOUNTER — Encounter (HOSPITAL_COMMUNITY): Payer: Self-pay | Admitting: Physical Therapy

## 2021-04-21 DIAGNOSIS — R262 Difficulty in walking, not elsewhere classified: Secondary | ICD-10-CM | POA: Insufficient documentation

## 2021-04-21 DIAGNOSIS — R4701 Aphasia: Secondary | ICD-10-CM | POA: Diagnosis not present

## 2021-04-21 DIAGNOSIS — R2689 Other abnormalities of gait and mobility: Secondary | ICD-10-CM | POA: Insufficient documentation

## 2021-04-21 DIAGNOSIS — M6281 Muscle weakness (generalized): Secondary | ICD-10-CM

## 2021-04-21 NOTE — Patient Instructions (Signed)
Access Code: [email or web address] URL: [email or web address] Date: 04/21/2021 Prepared by: Georges Lynch  Exercises Seated Long Arc Quad - 3 x daily - 7 x weekly - 2 sets - 10 reps Seated March - 3 x daily - 7 x weekly - 2 sets - 10 reps Seated Heel Toe Raises - 3 x daily - 7 x weekly - 2 sets - 10 reps Seated Calf Stretch with Strap - 3 x daily - 7 x weekly - 1 sets - 3 reps - 30 second hold
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
## 2021-04-23 ENCOUNTER — Ambulatory Visit (HOSPITAL_COMMUNITY): Payer: Medicaid Other | Admitting: Physical Therapy

## 2021-04-30 ENCOUNTER — Ambulatory Visit (HOSPITAL_COMMUNITY): Payer: Medicaid Other

## 2021-05-05 ENCOUNTER — Ambulatory Visit (HOSPITAL_COMMUNITY): Payer: Medicaid Other | Admitting: Physical Therapy

## 2021-05-07 ENCOUNTER — Encounter (HOSPITAL_COMMUNITY): Payer: Self-pay | Admitting: Physical Therapy

## 2021-05-07 ENCOUNTER — Other Ambulatory Visit: Payer: Self-pay

## 2021-05-07 ENCOUNTER — Ambulatory Visit (HOSPITAL_COMMUNITY): Payer: Medicaid Other | Admitting: Physical Therapy

## 2021-05-07 DIAGNOSIS — M6281 Muscle weakness (generalized): Secondary | ICD-10-CM

## 2021-05-07 DIAGNOSIS — R2689 Other abnormalities of gait and mobility: Secondary | ICD-10-CM

## 2021-05-07 DIAGNOSIS — R4701 Aphasia: Secondary | ICD-10-CM | POA: Diagnosis not present

## 2021-05-07 DIAGNOSIS — R262 Difficulty in walking, not elsewhere classified: Secondary | ICD-10-CM
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
## 2021-05-12 ENCOUNTER — Ambulatory Visit (HOSPITAL_COMMUNITY): Payer: Medicaid Other | Admitting: Physical Therapy

## 2021-05-12 ENCOUNTER — Ambulatory Visit (HOSPITAL_COMMUNITY): Payer: Medicaid Other | Admitting: Speech Pathology

## 2021-05-14 ENCOUNTER — Ambulatory Visit (HOSPITAL_COMMUNITY): Payer: Medicaid Other

## 2021-05-14 ENCOUNTER — Other Ambulatory Visit: Payer: Self-pay

## 2021-05-14 ENCOUNTER — Encounter (HOSPITAL_COMMUNITY): Payer: Self-pay

## 2021-05-14 DIAGNOSIS — R4701 Aphasia: Secondary | ICD-10-CM | POA: Diagnosis not present

## 2021-05-14 DIAGNOSIS — R2689 Other abnormalities of gait and mobility: Secondary | ICD-10-CM

## 2021-05-14 DIAGNOSIS — M6281 Muscle weakness (generalized): Secondary | ICD-10-CM

## 2021-05-14 DIAGNOSIS — R262 Difficulty in walking, not elsewhere classified: Secondary | ICD-10-CM
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
## 2021-05-19 ENCOUNTER — Ambulatory Visit (HOSPITAL_COMMUNITY): Payer: Medicaid Other | Admitting: Speech Pathology

## 2021-05-19 ENCOUNTER — Ambulatory Visit (HOSPITAL_COMMUNITY): Payer: Medicaid Other | Admitting: Physical Therapy

## 2021-05-21 ENCOUNTER — Encounter (HOSPITAL_COMMUNITY): Payer: Self-pay

## 2021-05-21 ENCOUNTER — Other Ambulatory Visit: Payer: Self-pay

## 2021-05-21 ENCOUNTER — Ambulatory Visit (HOSPITAL_COMMUNITY): Payer: Medicaid Other | Attending: Physical Medicine and Rehabilitation

## 2021-05-21 DIAGNOSIS — R2689 Other abnormalities of gait and mobility: Secondary | ICD-10-CM

## 2021-05-21 DIAGNOSIS — R262 Difficulty in walking, not elsewhere classified: Secondary | ICD-10-CM | POA: Diagnosis present

## 2021-05-21 DIAGNOSIS — R4701 Aphasia: Secondary | ICD-10-CM | POA: Diagnosis present

## 2021-05-21 DIAGNOSIS — M6281 Muscle weakness (generalized): Secondary | ICD-10-CM | POA: Diagnosis not present

## 2021-05-21 DIAGNOSIS — R41841 Cognitive communication deficit: Secondary | ICD-10-CM | POA: Insufficient documentation
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
## 2021-05-26 ENCOUNTER — Other Ambulatory Visit: Payer: Self-pay

## 2021-05-26 ENCOUNTER — Ambulatory Visit (HOSPITAL_COMMUNITY): Payer: Medicaid Other | Admitting: Speech Pathology

## 2021-05-26 ENCOUNTER — Encounter (HOSPITAL_COMMUNITY): Payer: Self-pay | Admitting: Physical Therapy

## 2021-05-26 ENCOUNTER — Encounter (HOSPITAL_COMMUNITY): Payer: Self-pay | Admitting: Speech Pathology

## 2021-05-26 ENCOUNTER — Ambulatory Visit (HOSPITAL_COMMUNITY): Payer: Medicaid Other | Admitting: Physical Therapy

## 2021-05-26 DIAGNOSIS — R262 Difficulty in walking, not elsewhere classified: Secondary | ICD-10-CM

## 2021-05-26 DIAGNOSIS — M6281 Muscle weakness (generalized): Secondary | ICD-10-CM | POA: Diagnosis not present

## 2021-05-26 DIAGNOSIS — R41841 Cognitive communication deficit: Secondary | ICD-10-CM

## 2021-05-26 DIAGNOSIS — R2689 Other abnormalities of gait and mobility: Secondary | ICD-10-CM

## 2021-05-26 DIAGNOSIS — R4701 Aphasia: Secondary | ICD-10-CM
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
## 2021-05-28 ENCOUNTER — Ambulatory Visit (HOSPITAL_COMMUNITY): Payer: Medicaid Other

## 2021-06-02 ENCOUNTER — Encounter (HOSPITAL_COMMUNITY): Payer: Self-pay | Admitting: Speech Pathology

## 2021-06-02 ENCOUNTER — Ambulatory Visit (HOSPITAL_COMMUNITY): Payer: Medicaid Other | Admitting: Physical Therapy

## 2021-06-02 ENCOUNTER — Ambulatory Visit (HOSPITAL_COMMUNITY): Payer: Medicaid Other | Admitting: Speech Pathology

## 2021-06-02 ENCOUNTER — Other Ambulatory Visit: Payer: Self-pay

## 2021-06-02 ENCOUNTER — Encounter (HOSPITAL_COMMUNITY): Payer: Self-pay | Admitting: Physical Therapy

## 2021-06-02 DIAGNOSIS — M6281 Muscle weakness (generalized): Secondary | ICD-10-CM | POA: Diagnosis not present

## 2021-06-02 DIAGNOSIS — R2689 Other abnormalities of gait and mobility: Secondary | ICD-10-CM

## 2021-06-02 DIAGNOSIS — R262 Difficulty in walking, not elsewhere classified: Secondary | ICD-10-CM

## 2021-06-02 DIAGNOSIS — R41841 Cognitive communication deficit: Secondary | ICD-10-CM

## 2021-06-02 DIAGNOSIS — R4701 Aphasia: Secondary | ICD-10-CM
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
## 2021-06-04 ENCOUNTER — Ambulatory Visit (HOSPITAL_COMMUNITY): Payer: Medicaid Other

## 2021-06-09 ENCOUNTER — Telehealth (HOSPITAL_COMMUNITY): Payer: Self-pay | Admitting: Physical Therapy

## 2021-06-09 NOTE — Telephone Encounter (Signed)
Called pt as she is on wait list - able to schedule one PT and one ST apt.  8:31 AM, 06/09/21 Tereasa Coop, DPT Physical Therapy with [facility]  [phone] office

## 2021-06-10 ENCOUNTER — Other Ambulatory Visit: Payer: Self-pay

## 2021-06-10 ENCOUNTER — Ambulatory Visit (HOSPITAL_COMMUNITY): Payer: Medicaid Other

## 2021-06-10 DIAGNOSIS — R2689 Other abnormalities of gait and mobility: Secondary | ICD-10-CM

## 2021-06-10 DIAGNOSIS — M6281 Muscle weakness (generalized): Secondary | ICD-10-CM | POA: Diagnosis not present

## 2021-06-10 DIAGNOSIS — R262 Difficulty in walking, not elsewhere classified: Secondary | ICD-10-CM
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
## 2021-06-12 IMAGING — DX DG TIBIA/FIBULA 2V*L*
3 series · 3 of 3 positions shown · non-contrast
Comparison: None.

CLINICAL DATA: Lower leg ORIF with drainage.

EXAM:
LEFT TIBIA AND FIBULA - 2 VIEW

[tibia ap]
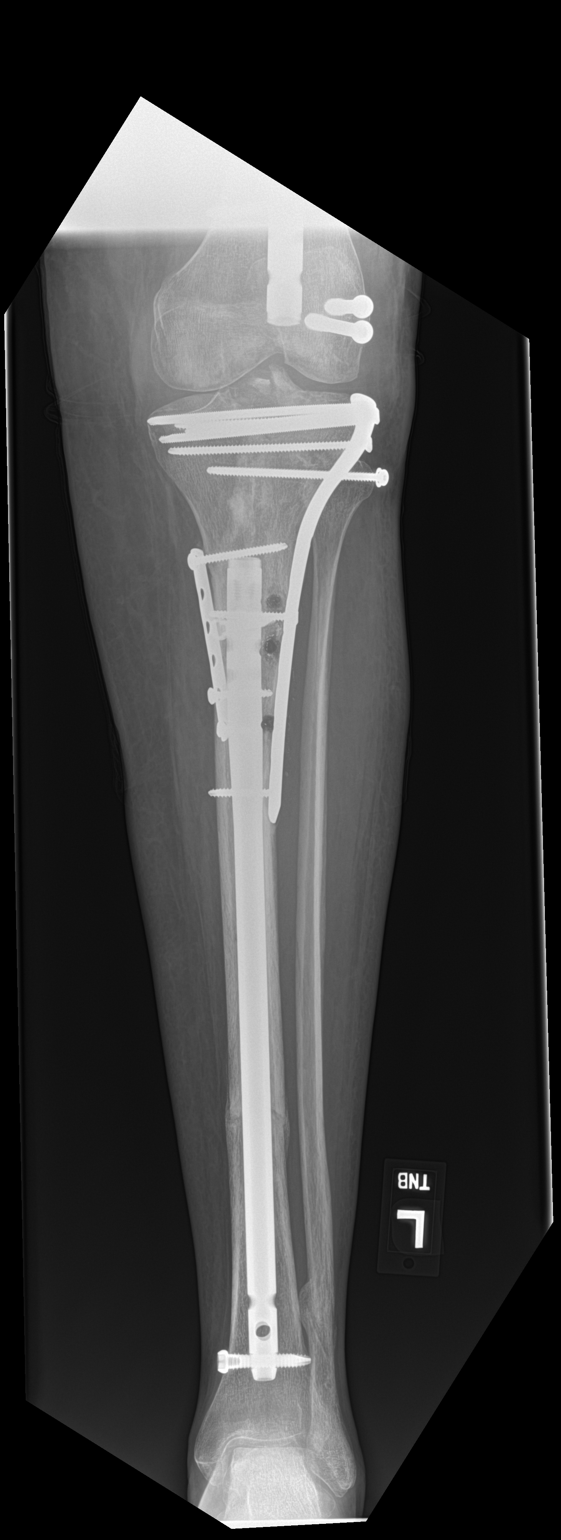

[tibia lat (1 of 2)]
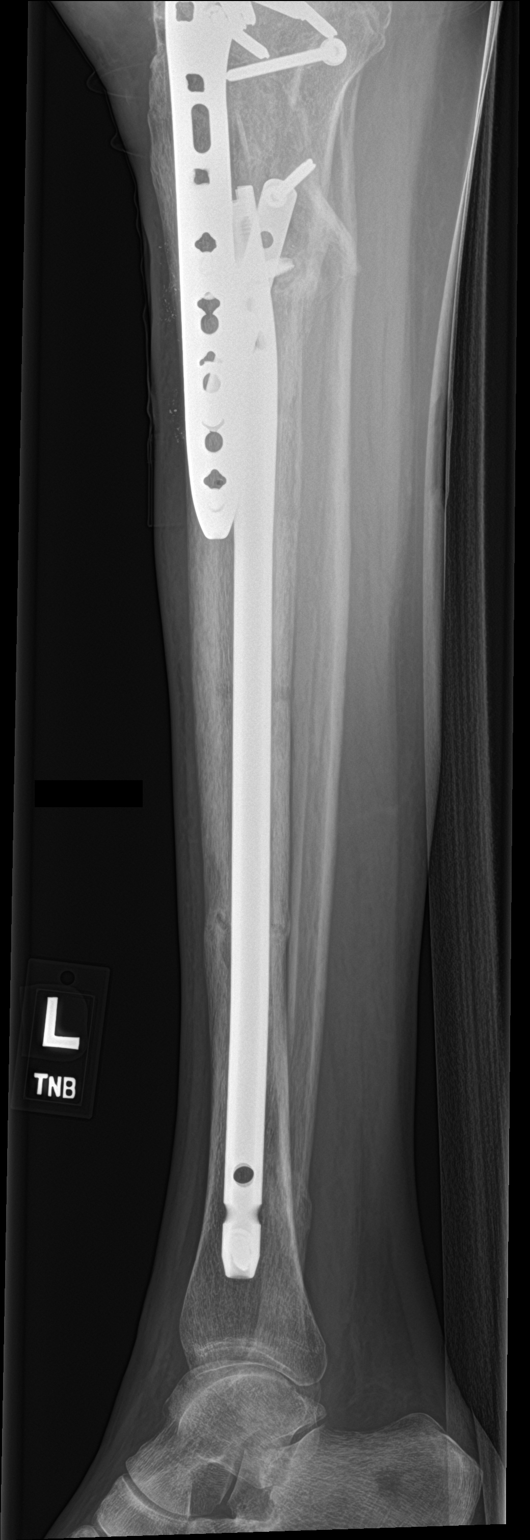

[tibia lat (2 of 2)]
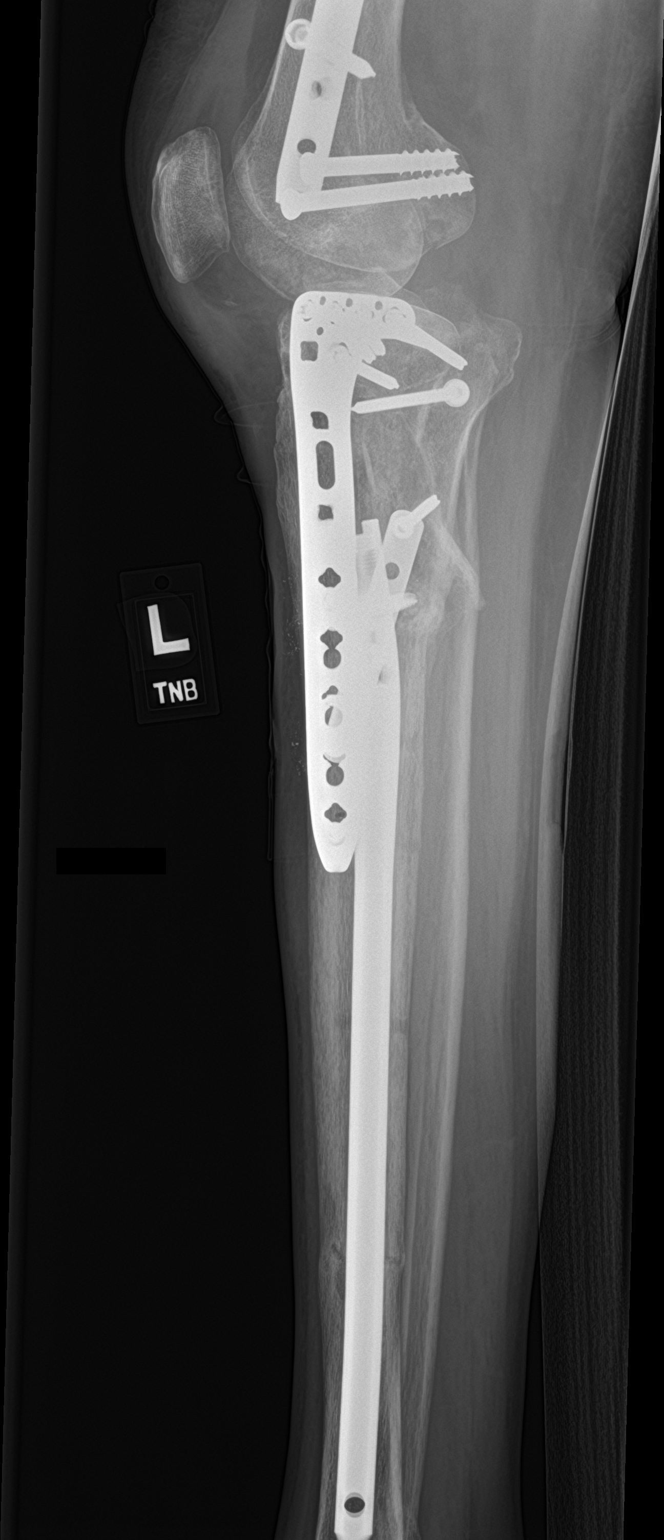

[3 of 3 positions shown; findings below may reference images not displayed]

FINDINGS: Extensive orthopedic hardware within the tibia including
intramedullary rod with proximal and distal interlocking screws as
well as medial and lateral sideplate and screw fixation constructs.
Partially visualized distal left femoral hardware. Hardware appears
intact without perihardware lucency or acute fracture. There is a
healed fracture of the distal tibial diaphysis with bridging callus
formation. Multiple ghost screw tracks within the tibia. Healed
fracture of the distal fibular metadiaphysis. No evidence of
cortical destruction. No focal soft tissue swelling or soft tissue
gas.
IMPRESSION: 1. No acute osseous abnormality. No radiographic evidence of
osteomyelitis.
2. Extensive orthopedic hardware within the tibia without evidence
of hardware complication.

## 2021-06-12 IMAGING — DX DG SHOULDER 2+V*R*
2 series · 2 of 2 positions shown · non-contrast
Comparison: None.

CLINICAL DATA: Right shoulder pain after fall

EXAM:
RIGHT SHOULDER - 2+ VIEW

[shoulder grashey]
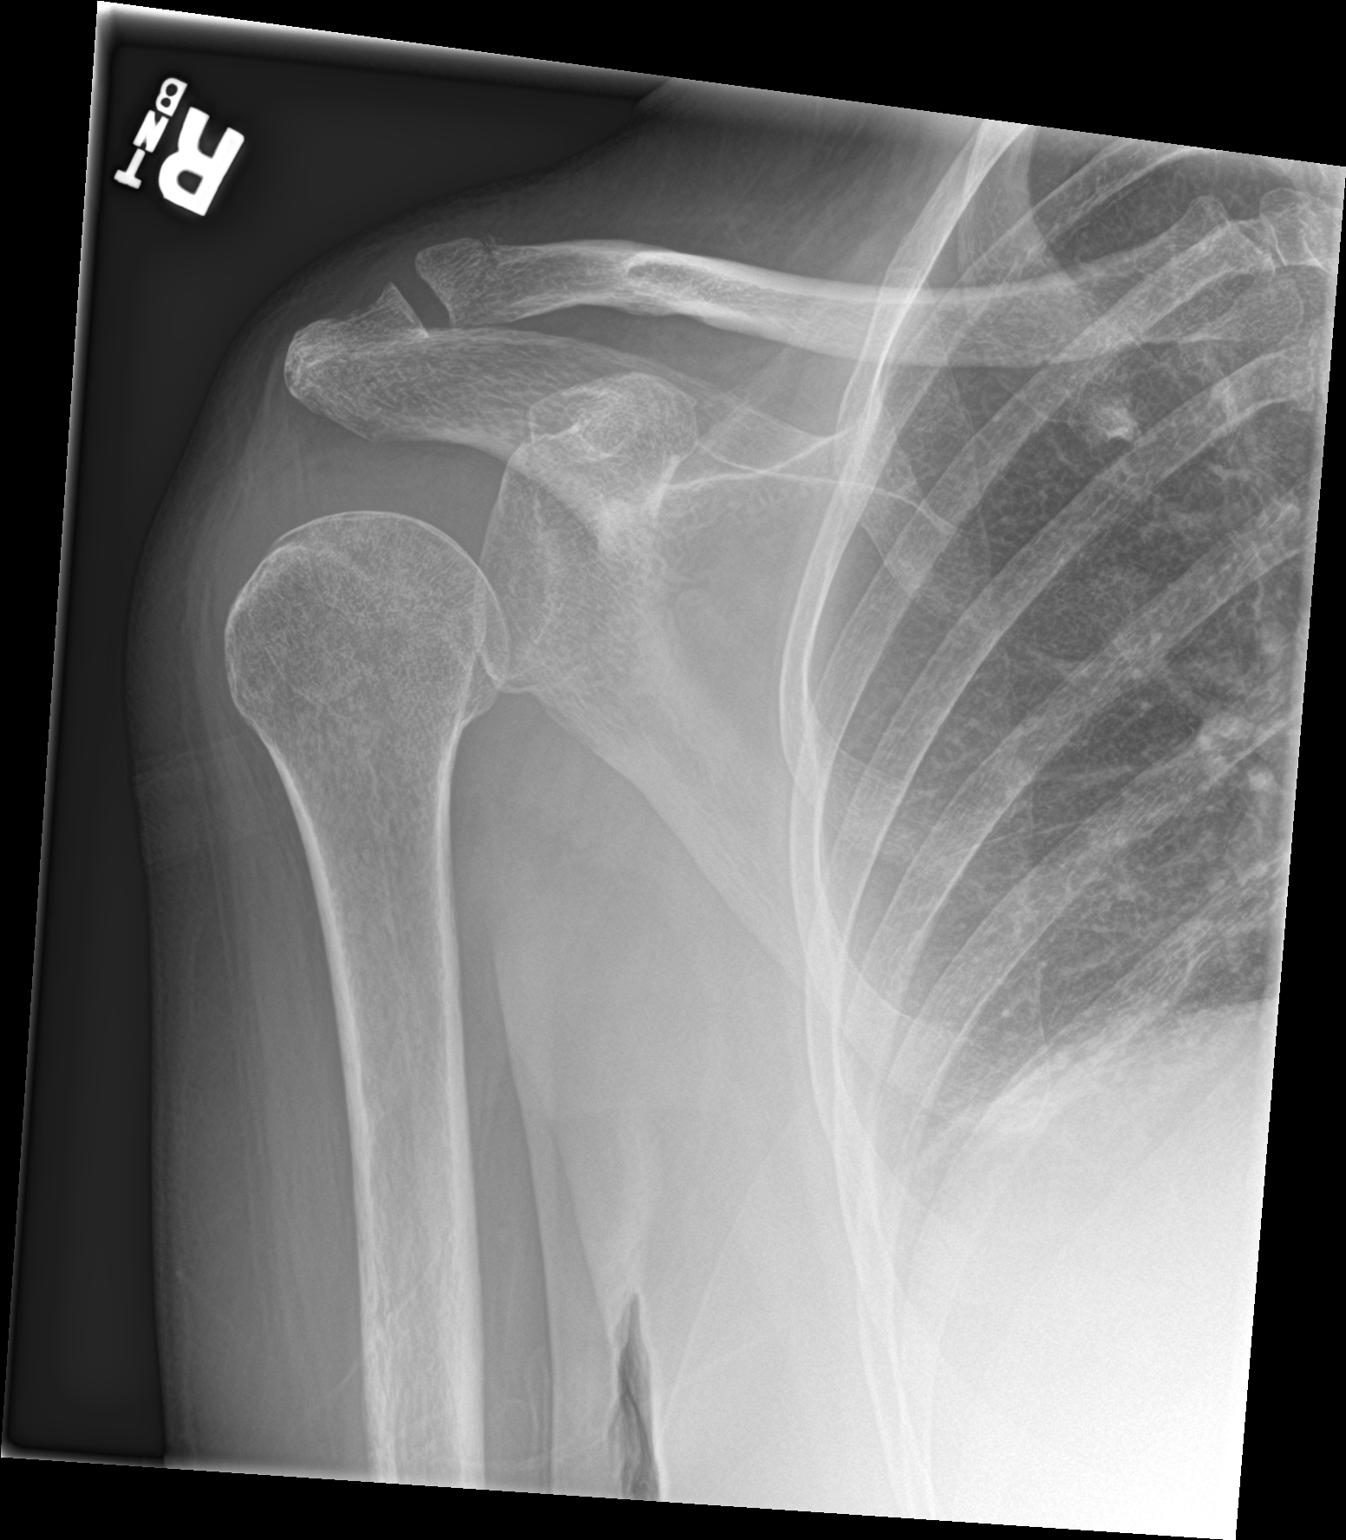

[shoulder y view]
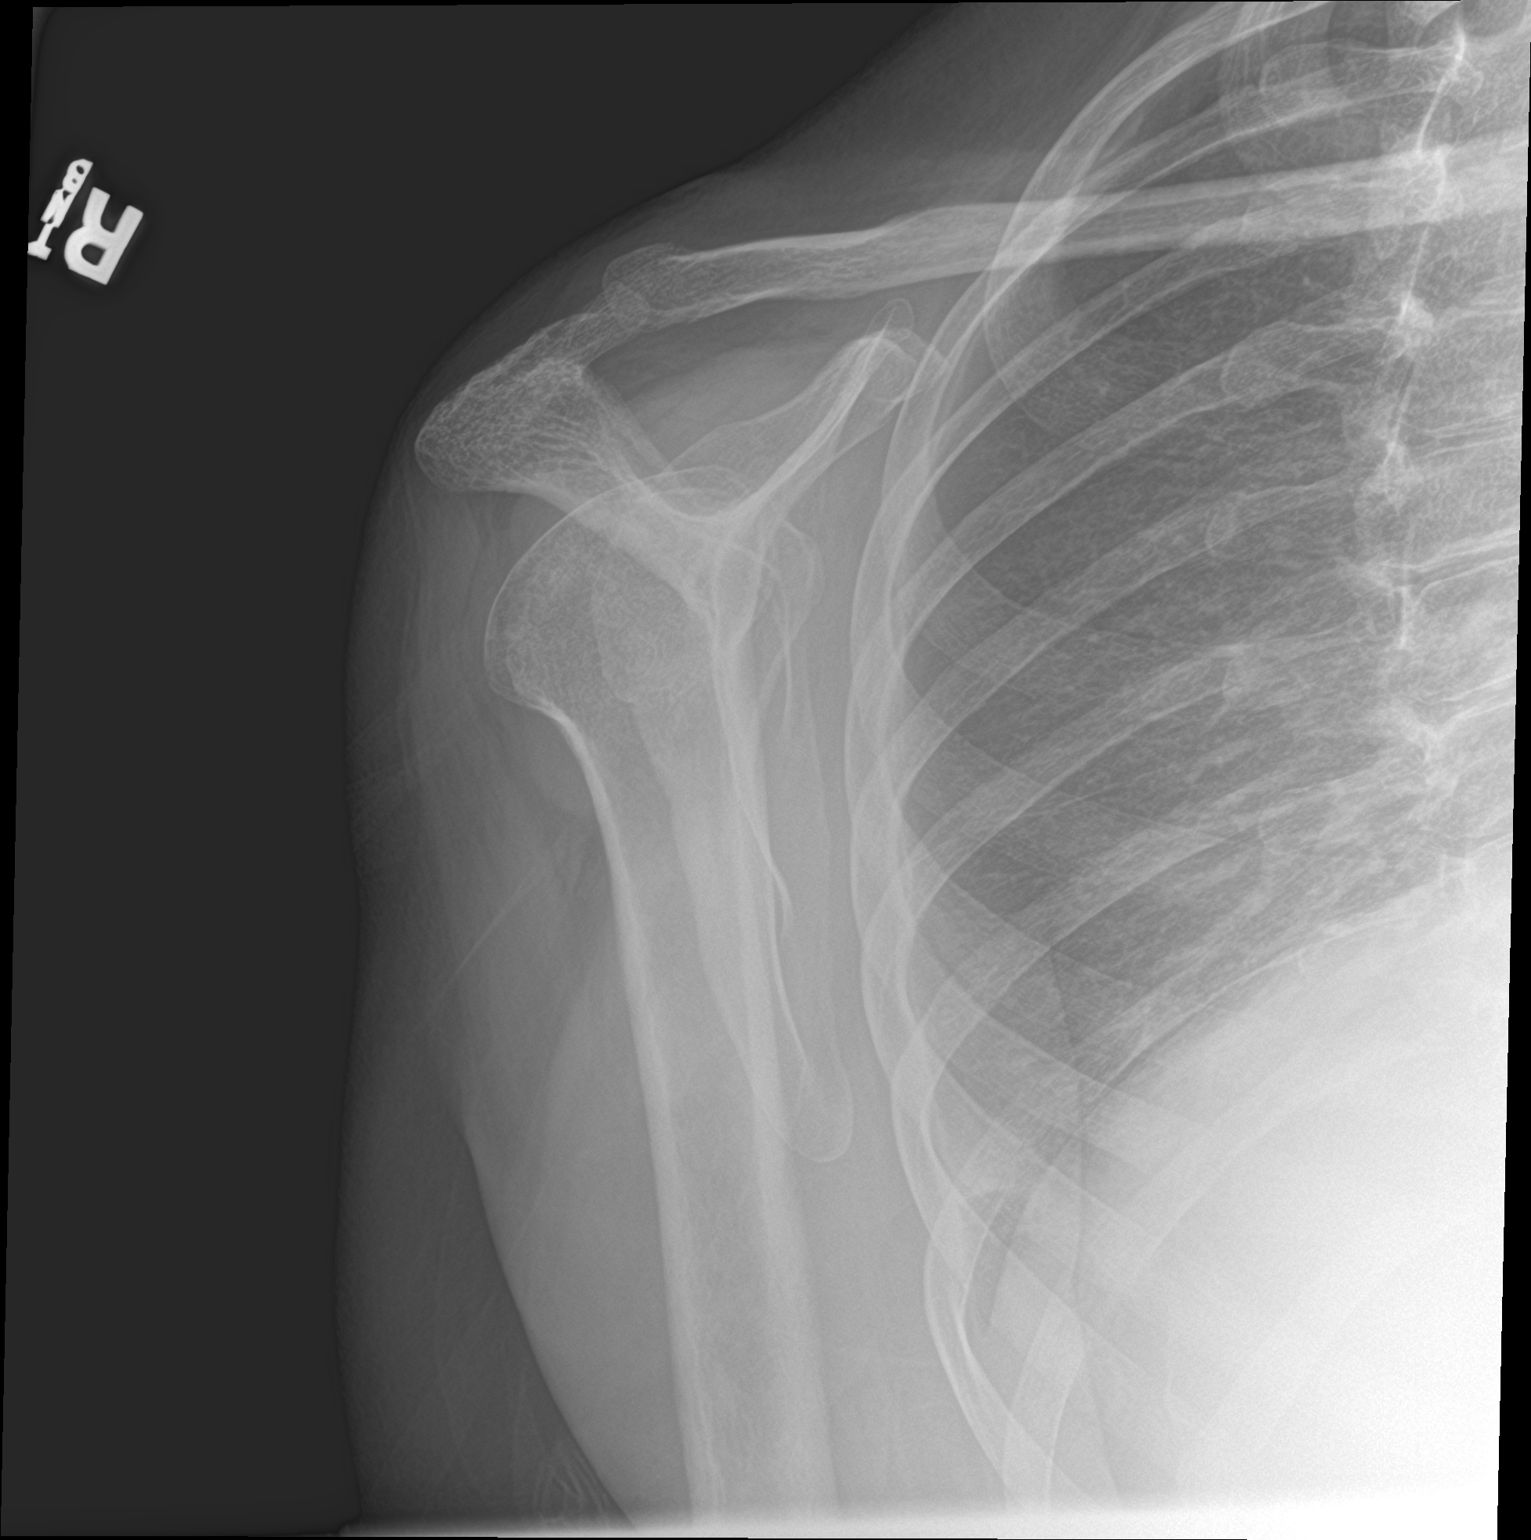

[2 of 2 positions shown; findings below may reference images not displayed]

FINDINGS: There is an acute nondisplaced fracture of the distal right clavicle
just proximal to the acromioclavicular joint. AC joint is intact
without widening. The humeral head is mildly inferiorly subluxed
relative to the glenoid without dislocation. No additional fracture.
Soft tissues are within normal limits.
IMPRESSION: 1. Acute nondisplaced fracture of the distal right clavicle just
proximal to the acromioclavicular joint.
2. Mild inferior subluxation of the right humeral head relative to
the glenoid without dislocation. Findings may be positional or
secondary to an underlying glenohumeral joint effusion.

## 2021-06-15 ENCOUNTER — Other Ambulatory Visit: Payer: Self-pay

## 2021-06-15 ENCOUNTER — Ambulatory Visit (HOSPITAL_COMMUNITY): Payer: Medicaid Other | Attending: Physical Medicine and Rehabilitation | Admitting: Physical Therapy

## 2021-06-15 DIAGNOSIS — M6281 Muscle weakness (generalized): Secondary | ICD-10-CM | POA: Diagnosis present

## 2021-06-15 DIAGNOSIS — L89312 Pressure ulcer of right buttock, stage 2: Secondary | ICD-10-CM | POA: Diagnosis present

## 2021-06-15 DIAGNOSIS — R262 Difficulty in walking, not elsewhere classified: Secondary | ICD-10-CM | POA: Insufficient documentation

## 2021-06-15 DIAGNOSIS — R2689 Other abnormalities of gait and mobility: Secondary | ICD-10-CM | POA: Diagnosis present

## 2021-06-15 DIAGNOSIS — L89322 Pressure ulcer of left buttock, stage 2: Secondary | ICD-10-CM | POA: Insufficient documentation

## 2021-06-15 NOTE — Therapy (Signed)
[facility] [address] [place], [place], [address] Phone: [phone]   Fax:  [phone]  Physical Therapy Treatment  Patient Details  Name: Gloria Aguilar MRN: [id number] Date of Birth: August 17, 1971 Referring Provider (PT): [place], [place]   Encounter Date: 06/15/2021   PT End of Session - 06/15/21 1721     Visit Number 8    Number of Visits 14    Date for PT Re-Evaluation 06/30/21    Authorization Type UHC Medicaid. No auth required.  Limit of 27 visits for all therapy.    Progress Note Due on Visit 10    PT Start Time 1620    PT Stop Time 1700    PT Time Calculation (min) 40 min    Equipment Utilized During Treatment Gait belt    Activity Tolerance Patient tolerated treatment well;Patient limited by fatigue    Behavior During Therapy [facility] for tasks assessed/performed             No past medical history on file.  Past Surgical History:  Procedure Laterality Date   COSMETIC SURGERY     EXTERNAL FIXATION LEG      There were no vitals filed for this visit.   Subjective Assessment - 06/15/21 1722     Subjective Pt states she is doing well today.    Currently in Pain? No/denies                               [facility] Adult PT Treatment/Exercise - 06/15/21 0001       Bed Mobility   Bed Mobility Rolling Right;Rolling Left;Left Sidelying to Sit    Rolling Right Minimal Assistance - Patient > 75%      Transfers   Sit to Stand 3: Mod assist    Sit to Stand Details Tactile cues for initiation    Stand Pivot Transfers 2: Max assist      Knee/Hip Exercises: Supine   Heel Slides Left;10 reps    Hip Adduction Isometric 10 reps    Hip Adduction Isometric Limitations squueze ball    Bridges 10 reps;2 sets    Bridges Limitations glut sets with verbal and tactile cueing    Straight Leg Raises Both;10 reps    Other Supine Knee/Hip Exercises hip abduction slide with towel 10X each                       PT Short Term Goals - 06/02/21 1531       PT SHORT TERM GOAL #1   Title Patient will be Min A with initial HEP and self-management strategies to improve functional outcomes    Baseline "We try to"    Time 3    Period Weeks    Status On-going    Target Date 05/12/21               PT Long Term Goals - 06/02/21 1531       PT LONG TERM GOAL #1   Title PT to be I in advance HEP to improve functioning at home to decrease burden off care giver.    Time 6    Period Weeks    Status On-going      PT LONG TERM GOAL #2   Title PT to be able to transfer with min assist .    Baseline Max A    Time 6    Period  Weeks    Status On-going      PT LONG TERM GOAL #3   Title PT standing balance to improve to allow pt to stand for 5 minutes to be able to complete self grooming tasks at bathroom sink.    Baseline 30-45 seconds with RW support    Time 6    Period Weeks    Status On-going      PT LONG TERM GOAL #4   Title PT to be able to walk with LRAD with stand by assist for 25 ft for household ambulation    Baseline non ambulatory    Time 6    Period Weeks    Status On-going                   Plan - 06/15/21 1718     Clinical Impression Statement Completed repeated transfers wheelchair to/from edge of mat.  Pt with tendency to reach/grab with Lt UE rather than use to push and assist over.  Pt with max verbal and tactile cues.  Max assist to transfer due to patient unable to move her LE's once she is up in standing requiing therapist to max transfer over.  Completed supine therex focusing on glute, abdominal and LE strength.  Pt very weak in core mm and minimal rise with bridge.  AAROM to complete most exercises with allowance of rest breaks due to fatigue..    Personal Factors and Comorbidities Comorbidity 2    Comorbidities TBI, multiple fx    Examination-Activity Limitations Bed Mobility;Bathing;Dressing;Locomotion  Level;Toileting;Stand;Squat;Transfers    Examination-Participation Restrictions Church;Cleaning;Community Activity;Driving;Interpersonal Relationship;Laundry;Medication Management;Meal Prep;Yard Work;Volunteer;Shop;School;Personal Finances;Occupation    Stability/Clinical Decision Making Evolving/Moderate complexity    Rehab Potential Fair    PT Frequency 2x / week    PT Duration 4 weeks    PT Treatment/Interventions ADLs/Self Care Home Management;Aquatic Therapy;Electrical Stimulation;Therapeutic activities;Therapeutic exercise;Balance training;Neuromuscular re-education;Functional mobility training;Gait training;Stair training;Patient/family education;Cognitive remediation;Wheelchair mobility training;Manual techniques;Passive range of motion    PT Next Visit Plan Focus on transfer training.  WBAT in brace. Progress hip and core strength as tolerated. Standing tolerance    PT Home Exercise Plan Eval: seated march, LAQ, ankle pump, calf stretch; 05/21/21: rolling, bridge, hip abd    Consulted and Agree with Plan of Care Patient;Family member/caregiver    Family Member Consulted Fiance Homero Fellers             Patient will benefit from skilled therapeutic intervention in order to improve the following deficits and impairments:  Decreased activity tolerance, Decreased balance, Decreased mobility, Decreased strength, Pain, Difficulty walking, Decreased endurance  Visit Diagnosis: Muscle weakness (generalized)  Other abnormalities of gait and mobility  Difficulty in walking, not elsewhere classified     Problem List There are no problems to display for this patient.  Lurena Nida, PTA/CLT [phone]  Lurena Nida 06/15/2021, 5:23 PM  [facility] [address] [place], [place], [address] Phone: [phone]   Fax:  [phone]  Name: Gloria Aguilar MRN: [id number] Date of Birth: 1971-02-27

## 2021-06-16 ENCOUNTER — Ambulatory Visit (HOSPITAL_COMMUNITY): Payer: Self-pay | Admitting: Speech Pathology

## 2021-06-17 ENCOUNTER — Ambulatory Visit (HOSPITAL_COMMUNITY): Payer: Medicaid Other

## 2021-06-23 ENCOUNTER — Telehealth (HOSPITAL_COMMUNITY): Payer: Self-pay | Admitting: Physical Therapy

## 2021-06-23 ENCOUNTER — Ambulatory Visit (HOSPITAL_COMMUNITY): Payer: Medicaid Other | Admitting: Physical Therapy

## 2021-06-23 NOTE — Telephone Encounter (Signed)
Patient's caregiver called to cx due to patient has ulcers on the buttocks- he is talking to MD to get home health to come and take care of this. Patient has been informed we offer that service also. He will talk to the MD and let us know what happens. Cx todays apptment.

## 2021-06-25 ENCOUNTER — Ambulatory Visit (HOSPITAL_COMMUNITY): Payer: Medicaid Other

## 2021-06-29 ENCOUNTER — Ambulatory Visit (HOSPITAL_COMMUNITY): Payer: Medicaid Other | Admitting: Physical Therapy

## 2021-07-01 ENCOUNTER — Ambulatory Visit (HOSPITAL_COMMUNITY): Payer: Medicaid Other | Admitting: Physical Therapy

## 2021-07-08 ENCOUNTER — Other Ambulatory Visit: Payer: Self-pay

## 2021-07-08 ENCOUNTER — Ambulatory Visit (HOSPITAL_COMMUNITY): Payer: Medicaid Other | Admitting: Physical Therapy

## 2021-07-08 ENCOUNTER — Encounter (HOSPITAL_COMMUNITY): Payer: Self-pay | Admitting: Physical Therapy

## 2021-07-08 DIAGNOSIS — M6281 Muscle weakness (generalized): Secondary | ICD-10-CM | POA: Diagnosis not present

## 2021-07-08 DIAGNOSIS — R262 Difficulty in walking, not elsewhere classified: Secondary | ICD-10-CM

## 2021-07-08 DIAGNOSIS — R2689 Other abnormalities of gait and mobility: Secondary | ICD-10-CM

## 2021-07-08 DIAGNOSIS — L89312 Pressure ulcer of right buttock, stage 2: Secondary | ICD-10-CM

## 2021-07-08 DIAGNOSIS — L89322 Pressure ulcer of left buttock, stage 2: Secondary | ICD-10-CM

## 2021-07-08 NOTE — Therapy (Signed)
[facility] [address] [place], [place], [address] Phone: [phone]   Fax:  [phone]  Wound Care Evaluation  Patient Details  Name: Gloria Aguilar MRN: [id number] Date of Birth: Dec 04, 1970 Referring Provider (PT): [place], [place]   Encounter Date: 07/08/2021   PT End of Session - 07/08/21 1219     Visit Number 1    Number of Visits 1    Authorization Type [facility] Medicaid. No auth required.  Limit of 27 visits for all therapy.    PT Start Time 1136    PT Stop Time 1210    PT Time Calculation (min) 34 min    Activity Tolerance Patient tolerated treatment well    Behavior During Therapy WFL for tasks assessed/performed             History reviewed. No pertinent past medical history.  Past Surgical History:  Procedure Laterality Date   COSMETIC SURGERY     EXTERNAL FIXATION LEG      There were no vitals filed for this visit.      Wound Therapy - 07/08/21 0001     Subjective Patient is a 50 y.o. female who presents to physical therapy with referral for stage 2 decubitus ulcers on buttocks as she is limited with mobility secondary to hx of TBI. Patient's spouse states patient is mostly bedbound and wounds developed on buttock several weeks ago. He has tried to heal them without success.    Patient and Family Stated Goals wounds to heal    Date of Onset 06/17/21    Prior Treatments different dressings/creams    Pain Score 7     Pain Type Acute pain    Pain Location Buttocks    Pain Orientation Right;Left    Evaluation and Treatment Procedures Explained to Patient/Family Yes    Evaluation and Treatment Procedures agreed to    Wound Properties Date First Assessed: 07/08/21 Time First Assessed: 1145 Wound Type: Other (Comment) , pressure ulcer  Location: Buttocks Location Orientation: Right;Left Wound Description (Comments): various small pressure ulcers - gluteal cleft Present on Admission: Yes   Wound Image Images  linked: 1    Dressing Type None    Dressing Changed Changed    Dressing Status None    Dressing Change Frequency PRN    Peri-wound Assessment Intact    Margins Attached edges (approximated)    Drainage Amount None    Treatment Cleansed;Other (Comment)   dressed wounds   Wound Therapy - Clinical Statement Patient is a 50 y.o. female who presents to physical therapy with referral for stage 2 decubitus ulcers on buttocks as she is limited with mobility secondary to hx of TBI. Patient with numerous small ulcers at bilateral gluteal cleft. Discussed with patient's spouse on getting home health PT for wound care and returning to OPPT if needed. Dressed wounds with medihoney as there was slough present in wound beds and applied Vaseline to periwound. Placed 4x4 over and made undergarments out of netting to hold dressing in place. Educated spouse on importance of changing positions frequently to off load wounds for healing and decreasing chance of new wounds forming, he verbalized understanding. Also educated on keeping wounds clean and changing dressings.    Wound Therapy - Functional Problem List bed mobility    Factors Delaying/Impairing Wound Healing Immobility    Wound Therapy - Frequency Other (comment)   one time visit   Wound Therapy - Current Recommendations Other (comment)   home health  Wound Therapy - Follow Up Recommendations Other (comment)   HHPT/RN for wound care   Wound Plan n/a    Dressing  medihoney, vaseline, 4x4, netting                   Objective measurements completed on examination: See above findings.             PT Education - 07/08/21 1218     Education Details Educated caregiver on proper wound dressing and importance of positioning with frequent positional changes    Person(s) Educated Patient;Spouse    Methods Explanation;Demonstration    Comprehension Verbalized understanding;Returned demonstration                   Plan - 07/08/21  1219     Clinical Impression Statement see above    Personal Factors and Comorbidities Comorbidity 3+    Comorbidities TBI, multiple fx, bedbound    Examination-Activity Limitations Bed Mobility;Bathing;Dressing;Locomotion Level;Toileting;Stand;Squat;Transfers    Examination-Participation Restrictions Church;Cleaning;Community Activity;Driving;Interpersonal Relationship;Laundry;Medication Management;Meal Prep;Yard Work;Volunteer;Shop;School;Personal Finances;Occupation    Stability/Clinical Decision Making Stable/Uncomplicated    Clinical Decision Making Low    Rehab Potential Good    PT Frequency One time visit    PT Treatment/Interventions ADLs/Self Care Home Management;Aquatic Therapy;Electrical Stimulation;Therapeutic activities;Therapeutic exercise;Balance training;Neuromuscular re-education;Functional mobility training;Gait training;Stair training;Patient/family education;Cognitive remediation;Wheelchair mobility training;Manual techniques;Passive range of motion    PT Next Visit Plan n/a    Consulted and Agree with Plan of Care Patient;Family member/caregiver    Family Member Consulted Fiance Homero Fellers             Patient will benefit from skilled therapeutic intervention in order to improve the following deficits and impairments:  Decreased activity tolerance, Decreased balance, Decreased mobility, Decreased strength, Pain, Difficulty walking, Decreased endurance, Decreased skin integrity  Visit Diagnosis: Pressure injury of right buttock, stage 2 (HCC)  Pressure injury of left buttock, stage 2 (HCC)  Other abnormalities of gait and mobility  Difficulty in walking, not elsewhere classified    Problem List There are no problems to display for this patient.   1:09 PM, 07/08/21 Wyman Songster PT, DPT Physical Therapist at [facility]   [facility] [address] [place], [place], [address] Phone:  [phone]   Fax:  [phone]  Name: Eriyah Fernando MRN: [id number] Date of Birth: [birth year]

## 2021-07-21 ENCOUNTER — Ambulatory Visit (HOSPITAL_COMMUNITY): Payer: Medicaid Other | Admitting: Speech Pathology

## 2021-10-22 ENCOUNTER — Telehealth: Payer: Self-pay | Admitting: Primary Care

## 2021-10-22 NOTE — Telephone Encounter (Signed)
Spoke with patient's significant other Juliann Pares, and have scheduled an In-home Palliative Consult for 10/26/21 @ 11 AM

## 2021-10-26 ENCOUNTER — Other Ambulatory Visit: Payer: Self-pay

## 2021-10-26 ENCOUNTER — Other Ambulatory Visit: Payer: Medicaid Other | Admitting: Primary Care

## 2021-10-26 VITALS — Ht 64.0 in | Wt 175.0 lb

## 2021-10-26 DIAGNOSIS — Z515 Encounter for palliative care: Secondary | ICD-10-CM

## 2021-10-26 DIAGNOSIS — S069X9S Unspecified intracranial injury with loss of consciousness of unspecified duration, sequela: Secondary | ICD-10-CM

## 2021-10-26 DIAGNOSIS — R4701 Aphasia: Secondary | ICD-10-CM

## 2021-10-26 DIAGNOSIS — I609 Nontraumatic subarachnoid hemorrhage, unspecified: Secondary | ICD-10-CM

## 2021-10-26 DIAGNOSIS — Z041 Encounter for examination and observation following transport accident: Secondary | ICD-10-CM | POA: Insufficient documentation
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
## 2021-10-27 ENCOUNTER — Telehealth: Payer: Self-pay | Admitting: Primary Care

## 2021-10-27 ENCOUNTER — Encounter: Payer: Self-pay | Admitting: Primary Care

## 2021-10-27 DIAGNOSIS — G8191 Hemiplegia, unspecified affecting right dominant side: Secondary | ICD-10-CM | POA: Insufficient documentation

## 2021-10-27 DIAGNOSIS — Z7409 Other reduced mobility: Secondary | ICD-10-CM | POA: Insufficient documentation

## 2021-10-27 DIAGNOSIS — Z9181 History of falling: Secondary | ICD-10-CM | POA: Insufficient documentation

## 2021-10-27 NOTE — Telephone Encounter (Signed)
T/c to [facility] Fellers, have ordered, half rails, gel mattress, larger bed side toilet. From [employer], be on the lookout for a call.   Advised I've asked  for dermatology referral from Dr . Martha Clan f/u with [facility] medical if they don't receive a call in several weeks to book an appt.   Told about 1800 medicare. He should call for disability medicare for which  patient will be eligible in a month.  Asked to call [facility] Medicaid to see if they process pCS as per prior to [facility] managed care, of if they needed a referral sent to another outlet. Voiced understanding of the above.

## 2021-11-04 ENCOUNTER — Telehealth: Payer: Self-pay | Admitting: Primary Care

## 2021-11-04 NOTE — Telephone Encounter (Signed)
T/c to family, they have PCS referral tomorrow. Hospital bed is coming today.

## 2021-11-23 ENCOUNTER — Other Ambulatory Visit: Payer: Self-pay

## 2021-11-23 ENCOUNTER — Other Ambulatory Visit: Payer: Medicaid Other | Admitting: Primary Care

## 2021-11-23 DIAGNOSIS — Z7409 Other reduced mobility: Secondary | ICD-10-CM

## 2021-11-23 DIAGNOSIS — S069X9S Unspecified intracranial injury with loss of consciousness of unspecified duration, sequela: Secondary | ICD-10-CM

## 2021-11-23 DIAGNOSIS — Z515 Encounter for palliative care: Secondary | ICD-10-CM

## 2021-11-23 DIAGNOSIS — R4701 Aphasia: Secondary | ICD-10-CM

## 2021-11-23 DIAGNOSIS — S098XXA Other specified injuries of head, initial encounter: Secondary | ICD-10-CM

## 2021-11-23 NOTE — Progress Notes (Signed)
[facility] Palliative Care Consult Note Telephone: [phone]  Fax: [phone]    Date of encounter: 11/23/21 1:58 PM PATIENT NAME: Gloria Aguilar [id number]   [phone] (home)  DOB: [birth year] MRN: [id number] PRIMARY CARE PROVIDER:    Danna Hefty, DO,  [address] [place] [address] [phone]  REFERRING PROVIDER:   [facility] [place],  [place] [address] [phone]  RESPONSIBLE PARTY:    Contact Information     Name Relation Home Work Mobile   mcclure,frank Significant other   [phone]        I met face to face with patient and family in  home. Palliative Care was asked to follow this patient by consultation request of  [facility] to address advance care planning and complex medical decision making. This is a follow up visit.                                   ASSESSMENT AND PLAN / RECOMMENDATIONS:   Advance Care Planning/Goals of Care: Goals include to maximize quality of life and symptom management. Patient/health care surrogate gave his/her permission to discuss.Our advance care planning conversation included a discussion about:    The value and importance of advance care planning  Exploration of personal, cultural or spiritual beliefs that might influence medical decisions  Exploration of goals of care in the event of a sudden injury or illness  Patient states several times during visit she wants to go to a nursing home to get stronger. Identification of a healthcare agent - Pilar Plate Review  of an  advance directive document . Has not gone over MOST , left another copy. CODE STATUS: FULL  Symptom Management/Plan:  Hospital bed - tried this and it was not they needed for care, eg bed mobility or position changes.  It is still in home and they may try to put it up in the front room for day time use.   BSC: sent same size after I reported  it was too small for patient hips.  I have reached out again to [employer] to see if they can send a larger one.Solmon Ice to also look at shops.  PCS: Intake person Came out from [facility], and and did intake, looking at facility at [place] for rehab.  Boyfriend insists PCS worker has stated she can go to rehab at [facility]. Goals are to increase mobility. He may also look for assistance in getting Medicaid funding for additional care.   Mobility: Mobile power chair may offer her ability to self propel and improve isolation. Discussed that this would take a while to coordinate and see if she could qualify. Boyfriend to call and find out if she is going to have Medicare as of 12/07/21. If not soon  I will try to get paid by medicaid.  Pain: Endorses pain in bil legs. Taking 30 mg  oxycodone daily,  Failed trial of fentanyl transdermal, has allergy to adhesives. I think a long acting eg xtampsa may be easier to administer.  Disability:  Should have medicare this month, boyfriend has not yet applied. He will call as Medicare will open up many care options.  Follow up Palliative Care Visit: Palliative care will continue to follow for complex medical decision making, advance care planning, and clarification of goals. Return 8 weeks  or prn.   This visit was coded based on medical decision making (MDM).  PPS: 40%  HOSPICE ELIGIBILITY/DIAGNOSIS: TBD  Chief Complaint: immobility  HISTORY OF PRESENT ILLNESS:  Gloria Aguilar is a 51 y.o. year old female  with TBI, immobility, fall risk, presents for ongoing PC needs and care needs in the community .   History obtained from review of EMR, discussion with primary team, and interview with family, facility staff/caregiver and/or Ms. Luse.  I reviewed available labs, medications, imaging, studies and related documents from the EMR.  Records reviewed and summarized above.   ROS   General: NAD EYES: denies vision changes ENMT: denies dysphagia Pulmonary: denies  cough, denies increased SOB, daily smoker Abdomen: endorses good appetite, denies constipation, endorses continence of bowel GU: denies dysuria, endorses continence of urine MSK:  denies  increased weakness,  no falls reported Skin: denies wounds Neurological: denies pain, denies insomnia Psych: Endorses positive mood Heme/lymph/immuno: denies bruises, abnormal bleeding  Physical Exam: Current and past weights: 175 reported, no new weight Constitutional: NAD General: frail appearing, WNWD EYES: anicteric sclera, lids intact, no discharge  ENMT: intact hearing, oral mucous membranes moist CV: S1S2, RRR, R 1 + LE edema Pulmonary: LCTA, no increased work of breathing, no cough, room air Abdomen: intake 100%, normo-active BS + 4 quadrants, soft and non tender, no ascites GU: deferred MSK: no sarcopenia, R hemiplegia,  non  ambulatory Skin: warm and dry, no rashes or wounds on visible skin Neuro:  + generalized weakness,  + cognitive impairment (expressive aphasia)  Psych: non-anxious affect, A and O x 2, attempts to make needs known with limited vocabulary. Can answer yes/ no questions. Hem/lymph/immuno: no widespread bruising   Thank you for the opportunity to participate in the care of Ms. Blough.  The palliative care team will continue to follow. Please call our office at [phone] if we can be of additional assistance.   Jason Coop, NP DNP, AGPCNP-BC  COVID-19 PATIENT SCREENING TOOL Asked and negative response unless otherwise noted:   Have you had symptoms of covid, tested positive or been in contact with someone with symptoms/positive test in the past 5-10 days?

## 2022-01-20 ENCOUNTER — Other Ambulatory Visit: Payer: Self-pay

## 2022-01-20 ENCOUNTER — Other Ambulatory Visit: Payer: Medicaid Other | Admitting: Primary Care

## 2022-01-20 VITALS — Ht 64.0 in | Wt 175.0 lb

## 2022-01-20 DIAGNOSIS — Z8673 Personal history of transient ischemic attack (TIA), and cerebral infarction without residual deficits: Secondary | ICD-10-CM

## 2022-01-20 DIAGNOSIS — Z9181 History of falling: Secondary | ICD-10-CM

## 2022-01-20 DIAGNOSIS — R32 Unspecified urinary incontinence: Secondary | ICD-10-CM

## 2022-01-20 DIAGNOSIS — S098XXA Other specified injuries of head, initial encounter: Secondary | ICD-10-CM

## 2022-01-20 DIAGNOSIS — Z515 Encounter for palliative care: Secondary | ICD-10-CM

## 2022-01-20 DIAGNOSIS — R4701 Aphasia: Secondary | ICD-10-CM

## 2022-01-20 DIAGNOSIS — G8191 Hemiplegia, unspecified affecting right dominant side: Secondary | ICD-10-CM

## 2022-01-20 DIAGNOSIS — S069X9S Unspecified intracranial injury with loss of consciousness of unspecified duration, sequela: Secondary | ICD-10-CM
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
## 2022-03-04 ENCOUNTER — Non-Acute Institutional Stay: Payer: Medicaid Other | Admitting: Primary Care

## 2022-03-04 DIAGNOSIS — Z7409 Other reduced mobility: Secondary | ICD-10-CM

## 2022-03-04 DIAGNOSIS — R4701 Aphasia: Secondary | ICD-10-CM

## 2022-03-04 DIAGNOSIS — I69951 Hemiplegia and hemiparesis following unspecified cerebrovascular disease affecting right dominant side: Secondary | ICD-10-CM
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
## 2022-05-21 ENCOUNTER — Non-Acute Institutional Stay: Payer: Medicaid Other | Admitting: Primary Care

## 2022-05-21 DIAGNOSIS — S069X9S Unspecified intracranial injury with loss of consciousness of unspecified duration, sequela: Secondary | ICD-10-CM

## 2022-05-21 DIAGNOSIS — Z9181 History of falling: Secondary | ICD-10-CM

## 2022-05-21 DIAGNOSIS — Z515 Encounter for palliative care: Secondary | ICD-10-CM

## 2022-05-21 DIAGNOSIS — R4701 Aphasia: Secondary | ICD-10-CM

## 2022-05-21 NOTE — Progress Notes (Signed)
Designer, jewellery Palliative Care Consult Note Telephone: [phone]  Fax: [phone]    Date of encounter: 05/21/22 2:09 PM PATIENT NAME: Gloria Aguilar [place] [address]   [phone] (home)  DOB: 1971-06-12 MRN: [id number] PRIMARY CARE PROVIDER:    Danna Hefty, DO,  [address] [place] [address] [phone]  REFERRING PROVIDER:   Danna Hefty, DO [address] [place],  [place] [address] [phone]  RESPONSIBLE PARTY:    Contact Information     Name Relation Home Work Mobile   Gloria Aguilar,Gloria Aguilar Significant other   [phone]        I met face to face with patient and family in [facility]  facility. Palliative Care was asked to follow this patient by consultation request of  Gloria Hefty, DO to address advance care planning and complex medical decision making. This is a follow up visit.                                   ASSESSMENT AND PLAN / RECOMMENDATIONS:   Advance Care Planning/Goals of Care: Goals include to maximize quality of life and symptom management. Patient/health care surrogate gave his/her permission to discuss.Our advance care planning conversation included a discussion about:    Exploration of personal, cultural or spiritual beliefs that might influence medical decisions  Identification of a healthcare agent - Gloria Aguilar CODE STATUS: FULL  Symptom Management/Plan:  I met with patient in her nursing home. SO/POA was also available. Patient states she is doing well but she and also  endorsed pain ongoing. Endorses  that some of her pain medicine has been reduced. I recommend acetaminophen CR for arthritis brand 650 milligrams every eight hours with 650 milligrams every eight hours PRN. Treating pain around the clock should help somewhat. She also has oxycodone 5 milligrams with 325  mg acetaminophen for pain every eight hours. She is working on mobility here at the facility.   SO  reports visiting often and advocating for her due to her aphasia. She's eating well and is still smoking, has tobacco use disorder. Goals are to keep her here until she can be more mobile and return home once that occurs. I spoke with Gloria Aguilar POA for extensive interview regarding needs at the facility. He feels he can make these known and has spent some time in advocacy.   Follow up Palliative Care Visit: Palliative care will continue to follow for complex medical decision making, advance care planning, and clarification of goals. Return 6 weeks or prn.  I spent 25 minutes providing this consultation. More than 50% of the time in this consultation was spent in counseling and care coordination.  PPS: 40%  HOSPICE ELIGIBILITY/DIAGNOSIS: TBD  Chief Complaint: debility, immobility, dysarthria  HISTORY OF PRESENT ILLNESS:  Gloria Aguilar is a 51 y.o. year old female  with TBI, dysphasia, dysarthria, s/p CVA, tobacco use disorder . Patient seen today to review palliative care needs to include medical decision making and advance care planning as appropriate.   History obtained from review of EMR, discussion with primary team, and interview with family, facility staff/caregiver and/or Gloria Aguilar.  I reviewed available labs, medications, imaging, studies and related documents from the EMR.  Records reviewed and summarized above.   ROS / SO General: NAD EYES: denies vision changes ENMT: denies dysphagia Cardiovascular: denies chest pain, denies DOE Pulmonary: denies  cough, denies increased SOB Abdomen: endorses good appetite, denies constipation, endorses incontinence of bowel GU: denies dysuria, endorses incontinence of urine MSK:  denies  increased weakness,  no falls reported (near fall reported) Skin: denies rashes or wounds Neurological: endorses  pain, denies insomnia, endorses aphasia from TBI Psych: Endorses positive mood, expressive aphasia s/p TBI  Physical Exam: Current and past  weights:218 lbs Constitutional: NAD EYES: anicteric sclera, lids intact, no discharge  ENMT: intact hearing, oral mucous membranes moist, dentition intact CV:  no LE edema Pulmonary:no increased work of breathing, no cough, room air Abdomen: intake 100%,  no ascites MSK: no sarcopenia, moves all extremities, non  ambulatory, working with therapy for increased mobility Skin: warm and dry, no rashes or wounds on visible skin Neuro:  no  new generalized weakness,  + cognitive impairment, non-anxious affect  Outpatient Encounter Medications as of 05/21/2022  Medication Sig   Acetaminophen 500 MG capsule Take 2 tablets by mouth 3 (three) times daily as needed.   aspirin 81 MG EC tablet Take 1 tablet by mouth 3 (three) times daily.   buPROPion (WELLBUTRIN SR) 150 MG 12 hr tablet Take 300 mg by mouth daily.   cyclobenzaprine (FLEXERIL) 5 MG tablet Take 5 mg by mouth 3 (three) times daily as needed.   diclofenac Sodium (VOLTAREN) 1 % GEL Apply 1 Application topically in the morning and at bedtime.   diphenhydrAMINE (BENADRYL) 25 MG tablet Take 25 mg by mouth 2 (two) times daily as needed.   DULoxetine (CYMBALTA) 60 MG capsule Take 60 mg by mouth 2 (two) times daily.   gabapentin (NEURONTIN) 300 MG capsule Take 600 mg by mouth 3 (three) times daily.   guaiFENesin (MUCINEX) 600 MG 12 hr tablet Take by mouth 2 (two) times daily.   hydrOXYzine (ATARAX) 50 MG tablet Take 1 tablet by mouth 3 (three) times daily.   ketoconazole (NIZORAL) 2 % shampoo Apply 1 application. topically 2 (two) times a week.   oxyCODONE-acetaminophen (PERCOCET/ROXICET) 5-325 MG tablet Take 1 tablet by mouth every 8 (eight) hours.   senna (SENOKOT) 8.6 MG TABS tablet Take 1 tablet by mouth daily as needed for mild constipation.   traZODone (DESYREL) 100 MG tablet Take 150 mg by mouth at bedtime.   triamcinolone cream (KENALOG) 0.1 % Apply 1 application. topically 2 (two) times daily.   [DISCONTINUED] oxyCODONE (OXY IR/ROXICODONE)  5 MG immediate release tablet Take 5 mg by mouth every 8 (eight) hours.   baclofen (LIORESAL) 10 MG tablet Take 10 mg by mouth 3 (three) times daily.   Biotin 10000 MCG TABS Take by mouth.   clonazePAM (KLONOPIN) 0.5 MG tablet Take 0.5 mg by mouth daily as needed.   Multiple Vitamins-Minerals (MULTIVITAMIN WITH MINERALS) tablet Take 1 tablet by mouth daily.   No facility-administered encounter medications on file as of 05/21/2022.     Thank you for the opportunity to participate in the care of Ms. Garza.  The palliative care team will continue to follow. Please call our office at [phone] if we can be of additional assistance.   Jason Coop, NP DNP, AGPCNP-BC  COVID-19 PATIENT SCREENING TOOL Asked and negative response unless otherwise noted:   Have you had symptoms of covid, tested positive or been in contact with someone with symptoms/positive test in the past 5-10 days?

## 2022-06-23 ENCOUNTER — Non-Acute Institutional Stay: Payer: Medicaid Other | Admitting: Primary Care

## 2022-06-23 DIAGNOSIS — Z515 Encounter for palliative care: Secondary | ICD-10-CM

## 2022-06-23 DIAGNOSIS — R4701 Aphasia: Secondary | ICD-10-CM

## 2022-06-23 DIAGNOSIS — Z7409 Other reduced mobility: Secondary | ICD-10-CM

## 2022-06-23 DIAGNOSIS — S069X9S Unspecified intracranial injury with loss of consciousness of unspecified duration, sequela: Secondary | ICD-10-CM

## 2022-06-23 NOTE — Progress Notes (Signed)
[facility] Consult Note Telephone: [phone]  Fax: [phone]    Date of encounter: 06/23/22 11:16 AM PATIENT NAME: Gloria Aguilar [id number]   [phone] (home)  DOB: 1971/06/22 MRN: [id number] PRIMARY CARE PROVIDER:    Danna Hefty, DO,  [address] [place] [address] [phone]  REFERRING PROVIDER:    Caprice Aguilar, [place],  [place] [address] [phone]   RESPONSIBLE PARTY:    Contact Information     Name Relation Home Work Mobile   mcclure,frank Significant other   [phone]       I met face to face with patient in [facility] facility. Palliative Care was asked to follow this patient by consultation request of  Gloria Renshaw, MD  to address advance care planning and complex medical decision making. This is a follow up visit.                                   ASSESSMENT AND PLAN / RECOMMENDATIONS:   Advance Care Planning/Goals of Care: Goals include to maximize quality of life and symptom management. Patient/health care surrogate gave his/her permission to discuss.Our advance care planning conversation included a discussion about:     Identification of a healthcare agent - Gloria Aguilar, sister may be in process CODE STATUS: FULL Discussed disposition  plans with SW Gloria Aguilar.  Symptom Management/Plan:  Patient up in w/Gloria, able to self propel in building. Her speech is still limited to repetition although she seems to have a few new words. ST, OT and PT working with her to rehab from TBI 2.5 years ago. She appears WNWD, and appears comfortable and not distressed.  I have reviewed her medications and would suggest something besides mirtazapine for insomnia. This is because mirtazapine is very weight + and patient has gained a good deal of weight. This will impact her ability for self transfers, and another preparation that is weight neutral should be  offered.   Follow up Palliative Care Visit: Palliative care will continue to follow for complex medical decision making, advance care planning, and clarification of goals. Return 6-8 weeks or prn.  I spent 25 minutes providing this consultation. More than 50% of the time in this consultation was spent in counseling and care coordination.  PPS: 40%  HOSPICE ELIGIBILITY/DIAGNOSIS: TBD  Chief Complaint: immobility  HISTORY OF PRESENT ILLNESS:  Gloria Aguilar is a 51 y.o. year old female  with TBI, aphasia, immobility . Patient seen today to review palliative care needs to include medical decision making and advance care planning as appropriate.   History obtained from review of EMR, discussion with primary team, and interview with family, facility staff/caregiver and/or Gloria Aguilar.  I reviewed available labs, medications, imaging, studies and related documents from the EMR.  Records reviewed and summarized above.   ROS/staff   General: NA ENMT: denies dysphagia Pulmonary: denies cough, denies increased SOB Abdomen: endorses good appetite, denies constipation, endorses incontinence of bowel GU: denies dysuria, endorses incontinence of urine MSK:  denies  increased weakness,  no falls reported Skin: denies rashes or wounds Neurological: denies pain, denies insomnia Psych: Endorses positive mood  Physical Exam: Current and past weights: 213 lbs Constitutional: NAD General: frail appearing, obese  EYES: anicteric sclera, lids intact, no discharge  ENMT: intact hearing, oral mucous membranes moist, dentition intact CV: no LE  edema Pulmonary: no increased work of breathing, no cough, room air Abdomen: intake 100%, soft and non tender, no ascites MSK: no sarcopenia, moves all extremities, non ambulatory, mobilizes in w/Gloria. Skin: warm and dry, no rashes or wounds on visible skin Neuro:  + generalized weakness,  + cognitive impairment, non-anxious affect, R hemiparesis   Thank you for the  opportunity to participate in the care of Gloria Aguilar.  The palliative care team will continue to follow. Please call our office at [phone] if we can be of additional assistance.   Jason Coop, NP DNP, AGPCNP-BC  COVID-19 PATIENT SCREENING TOOL Asked and negative response unless otherwise noted:   Have you had symptoms of covid, tested positive or been in contact with someone with symptoms/positive test in the past 5-10 days?

## 2022-08-26 ENCOUNTER — Non-Acute Institutional Stay: Payer: Medicaid Other | Admitting: Primary Care

## 2022-08-26 DIAGNOSIS — R4701 Aphasia: Secondary | ICD-10-CM

## 2022-08-26 DIAGNOSIS — Z515 Encounter for palliative care: Secondary | ICD-10-CM

## 2022-08-26 DIAGNOSIS — S069X9S Unspecified intracranial injury with loss of consciousness of unspecified duration, sequela: Secondary | ICD-10-CM

## 2022-08-26 NOTE — Progress Notes (Signed)
[facility] Consult Note Telephone: [phone]  Fax: [phone]    Date of encounter: 08/26/22 10:43 AM PATIENT NAME: Gloria Aguilar [id number]   [phone] (home)  DOB: [birth year] MRN: [id number] PRIMARY CARE PROVIDER:    Danna Hefty, DO,  [address] [place] [address] [phone]  REFERRING PROVIDER:   Caprice Renshaw, MD [address] [place],  [place] [address] [phone]   RESPONSIBLE PARTY:    Contact Information     Name Relation Home Work Mobile   Gloria Aguilar,Gloria Aguilar Significant other   [phone]       I met face to face with patient in [facility] facility. Palliative Care was asked to follow this patient by consultation request of  Caprice Renshaw, MD  to address advance care planning and complex medical decision making. This is a follow up visit.                                   ASSESSMENT AND PLAN / RECOMMENDATIONS:   Advance Care Planning/Goals of Care: Goals include to maximize quality of life and symptom management.  Identification of a healthcare agent  SO  CODE STATUS: FULL CODE  Symptom Management/Plan:  I met with patient in her nursing home. She appears a little down today. Able to answer a few Yes/no questions reliably. She cannot Speak in more than her usual repetitive patterns. Staff endorses she is recovering from pneumonia which she had several weeks ago. She has a small lingering cough, but is much improved. She appears down and depressed today, but frustrated that she cannot tell me why.  She states she has "got to go". This is a common statement of hers. When I ask, does she want to go home? She shook her head no.  Encourage ongoing psychiatric following for continued work on depression.She is currently on trazodone and cymbalta. She takes clonopin as well . She also may be able to tolerate a wean of gabapentin, which would be good to trial while she's at SNF.   For  removal of hardware in LE at [facility] in November, due to pain.  Follow up Palliative Care Visit: Palliative care will continue to follow for complex medical decision making, advance care planning, and clarification of goals. Return 6-8 weeks or prn.  I spent 25 minutes providing this consultation. More than 50% of the time in this consultation was spent in counseling and care coordination.  PPS: 50%  HOSPICE ELIGIBILITY/DIAGNOSIS: no  Chief Complaint: TBI, impaired mobility  HISTORY OF PRESENT ILLNESS:  Gloria Aguilar is a 51 y.o. year old female  with TBI, R deficits s/p CVA, dysphasia, impaired mobility .   History obtained from review of EMR, discussion with primary team, and interview with family, facility staff/caregiver and/or Ms. Finan.  I reviewed available labs, medications, imaging, studies and related documents from the EMR.  Records reviewed and summarized above.   ROS/staff   General: NAD ENMT: denies dysphagia Pulmonary: endorses  cough, denies increased SOB Abdomen: endorses good appetite, denies constipation, endorses incontinence of bowel GU: denies dysuria, endorses incontinence of urine MSK:  denies increased weakness,  no falls reported Skin: denies rashes or wounds Neurological: denies pain, denies insomnia Psych: Endorses sad mood Heme/lymph/immuno: denies bruises, abnormal bleeding  Physical Exam: Current and past weights: 217 lbs, (42 lb gain in 2.5 years) Constitutional: NAD General: frail  appearing, obese  EYES: anicteric sclera, lids intact, no discharge  ENMT: intact hearing, oral mucous membranes moist, dentition intact CV:  no LE edema Pulmonary:  no increased work of breathing, no cough, room air Abdomen: intake 100%,  soft and non tender, no ascites GU: deferred MSK: no sarcopenia, moves all extremities,  non ambulatory, uses power chair Skin: warm and dry, no rashes or wounds on visible skin Neuro:  no new generalized weakness,  + cognitive  impairment Psych: anxious affect, A and O x 2 Hem/lymph/immuno: no widespread bruising   Thank you for the opportunity to participate in the care of Ms. Landsberg.  The palliative care team will continue to follow. Please call our office at [phone] if we can be of additional assistance.   Jason Coop, NP   COVID-19 PATIENT SCREENING TOOL Asked and negative response unless otherwise noted:   Have you had symptoms of covid, tested positive or been in contact with someone with symptoms/positive test in the past 5-10 days?

## 2022-08-31 ENCOUNTER — Ambulatory Visit: Payer: Medicaid Other | Admitting: Podiatry

## 2022-09-09 ENCOUNTER — Ambulatory Visit (INDEPENDENT_AMBULATORY_CARE_PROVIDER_SITE_OTHER): Payer: Medicaid Other | Admitting: Podiatry

## 2022-09-09 DIAGNOSIS — L600 Ingrowing nail: Secondary | ICD-10-CM

## 2022-09-09 MED ORDER — DOXYCYCLINE HYCLATE 100 MG PO TABS: 100.0000 mg | ORAL_TABLET | Freq: Two times a day (BID) | ORAL | 0 refills | Status: DC

## 2022-09-09 NOTE — Patient Instructions (Signed)

## 2022-09-09 NOTE — Progress Notes (Signed)
Subjective:   Patient ID: Gloria Aguilar, female   DOB: 51 y.o.   MRN: [id number]   HPI Chief Complaint  Patient presents with   Ingrown Toenail    Right hallux ingrown drainage and swelling,    51 year old female presents for above concerns.  She gets tenderness on both the right and left hallux along the corners of the nail.  She has ingrown toenail left big toe both corners on the right lateral nail border which is causing pain.  She had some bleeding previously on the right lateral nail border.  Currently no pus that she reports.  She states that they trim the toenails but she continues to get pain along the ingrown.  She wants to have them removed.   Review of Systems  All other systems reviewed and are negative.  No past medical history on file.  Past Surgical History:  Procedure Laterality Date   COSMETIC SURGERY     EXTERNAL FIXATION LEG       Current Outpatient Medications:    doxycycline (VIBRA-TABS) 100 MG tablet, Take 1 tablet (100 mg total) by mouth 2 (two) times daily., Disp: 14 tablet, Rfl: 0   Acetaminophen 500 MG capsule, Take 2 tablets by mouth 3 (three) times daily as needed., Disp: , Rfl:    aspirin 81 MG EC tablet, Take 1 tablet by mouth 3 (three) times daily., Disp: , Rfl:    buPROPion (WELLBUTRIN SR) 150 MG 12 hr tablet, Take 300 mg by mouth daily., Disp: , Rfl:    clonazePAM (KLONOPIN) 0.5 MG tablet, Take 0.5 mg by mouth daily as needed., Disp: , Rfl:    diclofenac Sodium (VOLTAREN) 1 % GEL, Apply 1 Application topically in the morning and at bedtime., Disp: , Rfl:    diphenhydrAMINE (BENADRYL) 25 MG tablet, Take 25 mg by mouth 2 (two) times daily as needed., Disp: , Rfl:    DULoxetine (CYMBALTA) 60 MG capsule, Take 60 mg by mouth 2 (two) times daily., Disp: , Rfl:    gabapentin (NEURONTIN) 300 MG capsule, Take 600 mg by mouth 3 (three) times daily., Disp: , Rfl:    ketoconazole (NIZORAL) 2 % shampoo, Apply 1 application. topically 2 (two) times a week., Disp: ,  Rfl:    mirtazapine (REMERON) 30 MG tablet, Take 30 mg by mouth at bedtime., Disp: , Rfl:    Multiple Vitamins-Minerals (MULTIVITAMIN WITH MINERALS) tablet, Take 1 tablet by mouth daily., Disp: , Rfl:    naproxen sodium (ALEVE) 220 MG tablet, Take 220 mg by mouth daily as needed., Disp: , Rfl:    oxyCODONE-acetaminophen (PERCOCET/ROXICET) 5-325 MG tablet, Take 1 tablet by mouth every 8 (eight) hours., Disp: , Rfl:    polyethylene glycol (MIRALAX / GLYCOLAX) 17 g packet, Take 17 g by mouth daily as needed., Disp: , Rfl:    senna (SENOKOT) 8.6 MG TABS tablet, Take 1 tablet by mouth daily as needed for mild constipation., Disp: , Rfl:    triamcinolone cream (KENALOG) 0.1 %, Apply 1 application. topically 2 (two) times daily., Disp: , Rfl:   Allergies  Allergen Reactions   Cephalexin Hives   Morphine    Wound Dressing Adhesive           Objective:  Physical Exam  General: AAO x3, NAD  Dermatological: Incurvation present to both medial lateral aspect the left hallux toenail mostly to the right hallux lateral nail border.  There are some dried blood present on the right lateral nail border.  There is  localized edema to the nail borders.  No purulence or ascending cellulitis.  No open lesions.  Vascular: Dorsalis Pedis artery and Posterior Tibial artery pedal pulses are palpable bilateral with immedate capillary fill time. There is no pain with calf compression, swelling, warmth, erythema.   Neruologic: Grossly intact via light touch bilateral.  Musculoskeletal: Tenderness of ingrown toenail but no other areas of discomfort.     Assessment:   51 year old female with bilateral hallux ingrown toenails     Plan:  -Treatment options discussed including all alternatives, risks, and complications -Etiology of symptoms were discussed -At this time, patient wants to proceed with partial nail removal to the left medial, lateral as well as the right lateral nail border.  Risks and  complications were discussed with the patient for which they understand and  verbally consent to the procedure. Under sterile conditions a total of 3 mL of a mixture of 2% lidocaine plain and 0.5% Marcaine plain was infiltrated in a hallux block fashion. Once anesthetized, the skin was prepped in sterile fashion. A tourniquet was then applied. Next the above nail borders of the hallux nail border was sharply excised making sure to remove the entire offending nail border. Once the nail was  Removed, the area was debrided and the underlying skin was intact. The area was irrigated and hemostasis was obtained.  A dry sterile dressing was applied. After application of the dressing the tourniquet was removed and there is found to be an immediate capillary refill time to the digit. The patient tolerated the procedure well any complications. Post procedure instructions were discussed the patient for which he verbally understood.Discussed signs/symptoms of worsening infection and directed to call the office immediately should any occur or go directly to the emergency room. In the meantime, encouraged to call the office with any questions, concerns, changes symptoms.  Trula Slade DPM

## 2022-09-27 ENCOUNTER — Ambulatory Visit (INDEPENDENT_AMBULATORY_CARE_PROVIDER_SITE_OTHER): Payer: Medicaid Other | Admitting: Podiatry

## 2022-09-27 DIAGNOSIS — L600 Ingrowing nail: Secondary | ICD-10-CM | POA: Diagnosis not present

## 2022-09-27 DIAGNOSIS — R609 Edema, unspecified: Secondary | ICD-10-CM

## 2022-09-27 NOTE — Progress Notes (Unsigned)
Subjective: No chief complaint on file.   Denies any systemic complaints such as fevers, chills, nausea, vomiting. No acute changes since last appointment, and no other complaints at this time.   Objective: AAO x3, NAD DP/PT pulses palpable bilaterally, CRT less than 3 seconds Protective sensation intact with Simms Weinstein monofilament, vibratory sensation intact, Achilles tendon reflex intact No areas of pinpoint bony tenderness or pain with vibratory sensation. MMT 5/5, ROM WNL. No edema, erythema, increase in warmth to bilateral lower extremities.  No open lesions or pre-ulcerative lesions.  No pain with calf compression, swelling, warmth, erythema  Assessment:  Plan: -All treatment options discussed with the patient including all alternatives, risks, complications.   -Patient encouraged to call the office with any questions, concerns, change in symptoms.

## 2022-09-30 ENCOUNTER — Ambulatory Visit (HOSPITAL_COMMUNITY)
Admission: RE | Admit: 2022-09-30 | Discharge: 2022-09-30 | Disposition: A | Discharge status: Home / Self Care | Payer: Medicaid Other | Source: Ambulatory Visit | Attending: Podiatry | Admitting: Podiatry

## 2022-09-30 ENCOUNTER — Encounter (HOSPITAL_COMMUNITY): Payer: Self-pay

## 2022-09-30 DIAGNOSIS — R609 Edema, unspecified: Secondary | ICD-10-CM

## 2022-10-03 ENCOUNTER — Inpatient Hospital Stay
Admission: EM | Admit: 2022-10-03 | Discharge: 2022-10-04 | DRG: 871 | Disposition: A | Discharge status: Home / Self Care | Payer: Medicaid Other | Attending: Internal Medicine | Admitting: Internal Medicine

## 2022-10-03 ENCOUNTER — Emergency Department: Payer: Medicaid Other

## 2022-10-03 ENCOUNTER — Inpatient Hospital Stay: Payer: Medicaid Other

## 2022-10-03 ENCOUNTER — Other Ambulatory Visit: Payer: Self-pay

## 2022-10-03 ENCOUNTER — Encounter: Payer: Self-pay | Admitting: Internal Medicine

## 2022-10-03 DIAGNOSIS — I69351 Hemiplegia and hemiparesis following cerebral infarction affecting right dominant side: Secondary | ICD-10-CM | POA: Diagnosis not present

## 2022-10-03 DIAGNOSIS — J9811 Atelectasis: Secondary | ICD-10-CM | POA: Diagnosis present

## 2022-10-03 DIAGNOSIS — G9341 Metabolic encephalopathy: Secondary | ICD-10-CM | POA: Diagnosis present

## 2022-10-03 DIAGNOSIS — F17210 Nicotine dependence, cigarettes, uncomplicated: Secondary | ICD-10-CM | POA: Diagnosis present

## 2022-10-03 DIAGNOSIS — J9601 Acute respiratory failure with hypoxia: Secondary | ICD-10-CM | POA: Diagnosis present

## 2022-10-03 DIAGNOSIS — F32A Depression, unspecified: Secondary | ICD-10-CM | POA: Diagnosis present

## 2022-10-03 DIAGNOSIS — A4189 Other specified sepsis: Principal | ICD-10-CM | POA: Diagnosis present

## 2022-10-03 DIAGNOSIS — F419 Anxiety disorder, unspecified: Secondary | ICD-10-CM | POA: Diagnosis present

## 2022-10-03 DIAGNOSIS — J22 Unspecified acute lower respiratory infection: Secondary | ICD-10-CM | POA: Diagnosis not present

## 2022-10-03 DIAGNOSIS — Y92009 Unspecified place in unspecified non-institutional (private) residence as the place of occurrence of the external cause: Secondary | ICD-10-CM

## 2022-10-03 DIAGNOSIS — Z8673 Personal history of transient ischemic attack (TIA), and cerebral infarction without residual deficits: Secondary | ICD-10-CM

## 2022-10-03 DIAGNOSIS — U071 COVID-19: Secondary | ICD-10-CM | POA: Diagnosis present

## 2022-10-03 DIAGNOSIS — A419 Sepsis, unspecified organism: Secondary | ICD-10-CM | POA: Diagnosis not present

## 2022-10-03 DIAGNOSIS — Z6831 Body mass index (BMI) 31.0-31.9, adult: Secondary | ICD-10-CM | POA: Diagnosis not present

## 2022-10-03 DIAGNOSIS — N390 Urinary tract infection, site not specified: Secondary | ICD-10-CM | POA: Diagnosis present

## 2022-10-03 DIAGNOSIS — J9600 Acute respiratory failure, unspecified whether with hypoxia or hypercapnia: Secondary | ICD-10-CM | POA: Diagnosis not present

## 2022-10-03 DIAGNOSIS — Z7982 Long term (current) use of aspirin: Secondary | ICD-10-CM

## 2022-10-03 DIAGNOSIS — J069 Acute upper respiratory infection, unspecified: Secondary | ICD-10-CM | POA: Diagnosis present

## 2022-10-03 DIAGNOSIS — W19XXXA Unspecified fall, initial encounter: Secondary | ICD-10-CM | POA: Diagnosis not present

## 2022-10-03 DIAGNOSIS — E876 Hypokalemia: Secondary | ICD-10-CM | POA: Diagnosis present

## 2022-10-03 DIAGNOSIS — R6520 Severe sepsis without septic shock: Secondary | ICD-10-CM | POA: Diagnosis present

## 2022-10-03 DIAGNOSIS — I69320 Aphasia following cerebral infarction: Secondary | ICD-10-CM | POA: Diagnosis not present

## 2022-10-03 DIAGNOSIS — F418 Other specified anxiety disorders: Secondary | ICD-10-CM | POA: Diagnosis not present

## 2022-10-03 DIAGNOSIS — R471 Dysarthria and anarthria: Secondary | ICD-10-CM | POA: Diagnosis present

## 2022-10-03 DIAGNOSIS — Z79899 Other long term (current) drug therapy: Secondary | ICD-10-CM | POA: Diagnosis not present

## 2022-10-03 DIAGNOSIS — J189 Pneumonia, unspecified organism: Secondary | ICD-10-CM | POA: Diagnosis not present

## 2022-10-03 DIAGNOSIS — Z79891 Long term (current) use of opiate analgesic: Secondary | ICD-10-CM

## 2022-10-03 DIAGNOSIS — E669 Obesity, unspecified: Secondary | ICD-10-CM | POA: Diagnosis present

## 2022-10-03 DIAGNOSIS — J129 Viral pneumonia, unspecified: Secondary | ICD-10-CM | POA: Diagnosis not present

## 2022-10-03 DIAGNOSIS — Z720 Tobacco use: Secondary | ICD-10-CM | POA: Diagnosis not present

## 2022-10-03 DIAGNOSIS — Z87820 Personal history of traumatic brain injury: Secondary | ICD-10-CM | POA: Diagnosis not present

## 2022-10-03 HISTORY — DX: Cerebral infarction, unspecified: I63.9

## 2022-10-03 HISTORY — DX: Depression, unspecified: F32.A

## 2022-10-03 HISTORY — DX: Anxiety disorder, unspecified: F41.9

## 2022-10-03 LAB — LACTIC ACID, PLASMA
Lactic Acid, Venous: 2.5 mmol/L (ref 0.5–1.9)
Lactic Acid, Venous: 1.1 mmol/L (ref 0.5–1.9)

## 2022-10-03 LAB — CBC WITH DIFFERENTIAL/PLATELET
Abs Immature Granulocytes: 0.05 10*3/uL (ref 0.00–0.07)
Basophils Absolute: 0 10*3/uL (ref 0.0–0.1)
Basophils Relative: 0 %
Eosinophils Absolute: 0 10*3/uL (ref 0.0–0.5)
Eosinophils Relative: 0 %
HCT: 33.8 % — ABNORMAL LOW (ref 36.0–46.0)
Hemoglobin: 12.2 g/dL (ref 12.0–15.0)
Immature Granulocytes: 1 %
Lymphocytes Relative: 9 %
Lymphs Abs: 0.8 10*3/uL (ref 0.7–4.0)
MCH: 31.4 pg (ref 26.0–34.0)
MCHC: 36.1 g/dL — ABNORMAL HIGH (ref 30.0–36.0)
MCV: 86.9 fL (ref 80.0–100.0)
Monocytes Absolute: 0.8 10*3/uL (ref 0.1–1.0)
Monocytes Relative: 8 %
Neutro Abs: 7.6 10*3/uL (ref 1.7–7.7)
Neutrophils Relative %: 82 %
Platelets: 194 10*3/uL (ref 150–400)
RBC: 3.89 MIL/uL (ref 3.87–5.11)
RDW: 13 % (ref 11.5–15.5)
WBC: 9.3 10*3/uL (ref 4.0–10.5)
nRBC: 0 % (ref 0.0–0.2)

## 2022-10-03 LAB — RESP PANEL BY RT-PCR (FLU A&B, COVID) ARPGX2
Influenza A by PCR: NEGATIVE
Influenza B by PCR: NEGATIVE
SARS Coronavirus 2 by RT PCR: POSITIVE — AB

## 2022-10-03 LAB — COMPREHENSIVE METABOLIC PANEL
ALT: 23 U/L (ref 0–44)
AST: 25 U/L (ref 15–41)
Albumin: 3.6 g/dL (ref 3.5–5.0)
Alkaline Phosphatase: 129 U/L — ABNORMAL HIGH (ref 38–126)
Anion gap: 11 (ref 5–15)
BUN: 16 mg/dL (ref 6–20)
CO2: 23 mmol/L (ref 22–32)
Calcium: 8.5 mg/dL — ABNORMAL LOW (ref 8.9–10.3)
Chloride: 104 mmol/L (ref 98–111)
Creatinine, Ser: 0.7 mg/dL (ref 0.44–1.00)
GFR, Estimated: >60 mL/min (ref >60)
Glucose, Bld: 109 mg/dL — ABNORMAL HIGH (ref 70–99)
Potassium: 3.2 mmol/L — ABNORMAL LOW (ref 3.5–5.1)
Sodium: 138 mmol/L (ref 135–145)
Total Bilirubin: 0.6 mg/dL (ref 0.3–1.2)
Total Protein: 7.6 g/dL (ref 6.5–8.1)

## 2022-10-03 LAB — TROPONIN I (HIGH SENSITIVITY): Troponin I (High Sensitivity): 3 ng/L (ref <18)

## 2022-10-03 LAB — URINALYSIS, COMPLETE (UACMP) WITH MICROSCOPIC
Bacteria, UA: NONE SEEN
Bilirubin Urine: NEGATIVE
Glucose, UA: NEGATIVE mg/dL
Ketones, ur: NEGATIVE mg/dL
Nitrite: NEGATIVE
Protein, ur: 30 mg/dL — AB
Specific Gravity, Urine: 1.027 (ref 1.005–1.030)
pH: 5 (ref 5.0–8.0)

## 2022-10-03 LAB — MAGNESIUM: Magnesium: 1.8 mg/dL (ref 1.7–2.4)

## 2022-10-03 LAB — PROTIME-INR
INR: 1.1 (ref 0.8–1.2)
Prothrombin Time: 14.1 seconds (ref 11.4–15.2)

## 2022-10-03 LAB — C-REACTIVE PROTEIN: CRP: 10.6 mg/dL — ABNORMAL HIGH (ref <1.0)

## 2022-10-03 LAB — APTT: aPTT: 25 seconds (ref 24–36)

## 2022-10-03 LAB — PROCALCITONIN: Procalcitonin: <0.1 ng/mL

## 2022-10-03 LAB — LIPASE, BLOOD: Lipase: 26 U/L (ref 11–51)

## 2022-10-03 MED ORDER — LACTATED RINGERS IV SOLN: INTRAVENOUS | Status: AC

## 2022-10-03 MED ORDER — GABAPENTIN 300 MG PO CAPS
600.0000 mg | ORAL_CAPSULE | Freq: Three times a day (TID) | ORAL | Status: DC
  Administered 2022-10-03 – 2022-10-04 (×3): 600 mg via ORAL
  Filled 2022-10-03 (×3): qty 2

## 2022-10-03 MED ORDER — FENTANYL CITRATE PF 50 MCG/ML IJ SOSY
50.0000 ug | PREFILLED_SYRINGE | Freq: Once | INTRAMUSCULAR | Status: AC
  Administered 2022-10-03: 50 ug via INTRAVENOUS
  Filled 2022-10-03: qty 1

## 2022-10-03 MED ORDER — ADULT MULTIVITAMIN W/MINERALS CH
1.0000 | ORAL_TABLET | Freq: Every day | ORAL | Status: DC
  Administered 2022-10-04: 1 via ORAL
  Filled 2022-10-03: qty 1

## 2022-10-03 MED ORDER — DULOXETINE HCL 30 MG PO CPEP
60.0000 mg | ORAL_CAPSULE | Freq: Two times a day (BID) | ORAL | Status: DC
  Administered 2022-10-03 – 2022-10-04 (×2): 60 mg via ORAL
  Filled 2022-10-03 (×2): qty 2

## 2022-10-03 MED ORDER — ORAL CARE MOUTH RINSE: 15.0000 mL | OROMUCOSAL | Status: DC | PRN

## 2022-10-03 MED ORDER — OXYCODONE-ACETAMINOPHEN 5-325 MG PO TABS
1.0000 | ORAL_TABLET | ORAL | Status: DC | PRN
  Administered 2022-10-04 (×2): 1 via ORAL
  Filled 2022-10-03 (×3): qty 1

## 2022-10-03 MED ORDER — SODIUM CHLORIDE 0.9 % IV BOLUS (SEPSIS)
1000.0000 mL | Freq: Once | INTRAVENOUS | Status: AC
  Administered 2022-10-03: 1000 mL via INTRAVENOUS

## 2022-10-03 MED ORDER — SODIUM CHLORIDE 0.9 % IV SOLN
2.0000 g | INTRAVENOUS | Status: DC
  Administered 2022-10-03: 2 g via INTRAVENOUS
  Filled 2022-10-03 (×2): qty 20

## 2022-10-03 MED ORDER — METRONIDAZOLE 500 MG/100ML IV SOLN
500.0000 mg | Freq: Once | INTRAVENOUS | Status: AC
  Administered 2022-10-03: 500 mg via INTRAVENOUS
  Filled 2022-10-03: qty 100

## 2022-10-03 MED ORDER — NIRMATRELVIR/RITONAVIR (PAXLOVID)TABLET
3.0000 | ORAL_TABLET | Freq: Two times a day (BID) | ORAL | Status: DC
  Administered 2022-10-03 – 2022-10-04 (×3): 3 via ORAL
  Filled 2022-10-03: qty 30

## 2022-10-03 MED ORDER — ACETAMINOPHEN 500 MG PO TABS
1000.0000 mg | ORAL_TABLET | Freq: Once | ORAL | Status: AC
  Administered 2022-10-03: 1000 mg via ORAL
  Filled 2022-10-03: qty 2

## 2022-10-03 MED ORDER — MIRTAZAPINE 15 MG PO TABS
30.0000 mg | ORAL_TABLET | Freq: Every day | ORAL | Status: DC
  Administered 2022-10-03: 30 mg via ORAL
  Filled 2022-10-03: qty 2

## 2022-10-03 MED ORDER — DEXAMETHASONE SODIUM PHOSPHATE 10 MG/ML IJ SOLN
10.0000 mg | Freq: Once | INTRAMUSCULAR | Status: AC
  Administered 2022-10-03: 10 mg via INTRAVENOUS
  Filled 2022-10-03: qty 1

## 2022-10-03 MED ORDER — SODIUM CHLORIDE 0.9 % IV SOLN
2.0000 g | Freq: Once | INTRAVENOUS | Status: AC
  Administered 2022-10-03: 2 g via INTRAVENOUS
  Filled 2022-10-03: qty 10

## 2022-10-03 MED ORDER — POTASSIUM CHLORIDE CRYS ER 20 MEQ PO TBCR
40.0000 meq | EXTENDED_RELEASE_TABLET | Freq: Once | ORAL | Status: AC
  Administered 2022-10-03: 40 meq via ORAL
  Filled 2022-10-03: qty 2

## 2022-10-03 MED ORDER — CLONAZEPAM 1 MG PO TABS
1.5000 mg | ORAL_TABLET | Freq: Two times a day (BID) | ORAL | Status: DC | PRN
  Administered 2022-10-03 – 2022-10-04 (×2): 1.5 mg via ORAL
  Filled 2022-10-03: qty 1
  Filled 2022-10-03: qty 3

## 2022-10-03 MED ORDER — VANCOMYCIN HCL 2000 MG/400ML IV SOLN
2000.0000 mg | Freq: Once | INTRAVENOUS | Status: AC
  Administered 2022-10-03: 2000 mg via INTRAVENOUS
  Filled 2022-10-03: qty 400

## 2022-10-03 MED ORDER — IPRATROPIUM BROMIDE HFA 17 MCG/ACT IN AERS
2.0000 | INHALATION_SPRAY | Freq: Four times a day (QID) | RESPIRATORY_TRACT | Status: DC
  Administered 2022-10-03 – 2022-10-04 (×5): 2 via RESPIRATORY_TRACT
  Filled 2022-10-03: qty 12.9

## 2022-10-03 MED ORDER — TRAZODONE HCL 50 MG PO TABS
150.0000 mg | ORAL_TABLET | Freq: Every day | ORAL | Status: DC
  Administered 2022-10-03: 150 mg via ORAL
  Filled 2022-10-03: qty 1

## 2022-10-03 MED ORDER — NICOTINE 21 MG/24HR TD PT24
21.0000 mg | MEDICATED_PATCH | Freq: Every day | TRANSDERMAL | Status: DC
  Administered 2022-10-03 – 2022-10-04 (×2): 21 mg via TRANSDERMAL
  Filled 2022-10-03 (×2): qty 1

## 2022-10-03 MED ORDER — ALLOPURINOL 100 MG PO TABS
100.0000 mg | ORAL_TABLET | Freq: Every day | ORAL | Status: DC
  Administered 2022-10-03 – 2022-10-04 (×2): 100 mg via ORAL
  Filled 2022-10-03 (×2): qty 1

## 2022-10-03 MED ORDER — BUSPIRONE HCL 10 MG PO TABS
5.0000 mg | ORAL_TABLET | Freq: Three times a day (TID) | ORAL | Status: DC
  Administered 2022-10-03 – 2022-10-04 (×4): 5 mg via ORAL
  Filled 2022-10-03 (×4): qty 1

## 2022-10-03 MED ORDER — ONDANSETRON HCL 4 MG/2ML IJ SOLN: 4.0000 mg | Freq: Three times a day (TID) | INTRAMUSCULAR | Status: DC | PRN

## 2022-10-03 MED ORDER — IOHEXOL 300 MG/ML  SOLN
100.0000 mL | Freq: Once | INTRAMUSCULAR | Status: AC | PRN
  Administered 2022-10-03: 100 mL via INTRAVENOUS

## 2022-10-03 MED ORDER — OXYCODONE-ACETAMINOPHEN 5-325 MG PO TABS
1.0000 | ORAL_TABLET | Freq: Three times a day (TID) | ORAL | Status: DC
  Administered 2022-10-03 – 2022-10-04 (×4): 1 via ORAL
  Filled 2022-10-03 (×3): qty 1

## 2022-10-03 MED ORDER — ASPIRIN 81 MG PO TBEC
81.0000 mg | DELAYED_RELEASE_TABLET | Freq: Every day | ORAL | Status: DC
  Administered 2022-10-04: 81 mg via ORAL
  Filled 2022-10-03: qty 1

## 2022-10-03 MED ORDER — ALBUTEROL SULFATE HFA 108 (90 BASE) MCG/ACT IN AERS
2.0000 | INHALATION_SPRAY | RESPIRATORY_TRACT | Status: DC | PRN
  Administered 2022-10-04: 2 via RESPIRATORY_TRACT
  Filled 2022-10-03: qty 6.7

## 2022-10-03 MED ORDER — METHYLPREDNISOLONE SODIUM SUCC 40 MG IJ SOLR
40.0000 mg | Freq: Two times a day (BID) | INTRAMUSCULAR | Status: DC
  Administered 2022-10-03 – 2022-10-04 (×2): 40 mg via INTRAVENOUS
  Filled 2022-10-03 (×3): qty 1

## 2022-10-03 MED ORDER — DM-GUAIFENESIN ER 30-600 MG PO TB12: 1.0000 | ORAL_TABLET | Freq: Two times a day (BID) | ORAL | Status: DC | PRN

## 2022-10-03 MED ORDER — VANCOMYCIN HCL IN DEXTROSE 1-5 GM/200ML-% IV SOLN: 1000.0000 mg | Freq: Once | INTRAVENOUS | Status: DC

## 2022-10-03 MED ORDER — ACETAMINOPHEN 325 MG PO TABS
650.0000 mg | ORAL_TABLET | Freq: Four times a day (QID) | ORAL | Status: DC | PRN
  Administered 2022-10-03: 650 mg via ORAL
  Filled 2022-10-03: qty 2

## 2022-10-03 MED ORDER — POLYETHYLENE GLYCOL 3350 17 G PO PACK: 17.0000 g | PACK | Freq: Every day | ORAL | Status: DC | PRN

## 2022-10-03 NOTE — ED Notes (Signed)
Pt extremely anxious and c/o pain.  PRN meds given.

## 2022-10-03 NOTE — H&P (Addendum)
History and Physical    Gloria Aguilar WUJ:[id number] DOB: 19-Jul-1971 DOA: 10/03/2022  Referring MD/NP/PA:   PCP: Joana ReamerMullis, Kiersten P, DO   Patient coming from:  The patient is coming from home.  Chief Complaint: SOB and confusion  HPI: Gloria Aguilar is a 51 y.o. 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  Patient is confused, cannot provide accurate medical history.  I called her power of attorney (Mr. Gloria Aguilar) by phone.  Per her power of attorney, patient has cough, shortness breath, fever, chills in the past several days.  Patient did not complain of chest pain at home.  Patient was found to have oxygen desaturation to 86% on room air, which improved to 100% on 3 L oxygen.  Patient has generalized weakness and fell out of the bed yesterday.  Not sure if patient has any injury.  No active nausea vomiting, diarrhea noted.  Not sure if patient has symptoms of UTI.  At her normal baseline, patient is alert, orientated x3, but today patient is altered, not oriented x 3.  Patient has chronic aphasia and right-sided weakness. Her daughter reports that pt has possible DVT and wants to do LE venous doppler.  Data reviewed independently and ED Course: pt was found to have positive COVID PCR, WBC 9.3, lactic acid 2.5, 1.1, INR 1.1, troponin level 3, lipase 26, liver function normal, urinalysis (cloudy appearance, trace amount of leukocyte, negative bacteria, WBC 11-20, with squamous cell contamination 11/20), potassium 3.2, GFR> 60.  Temperature one 1.1, blood pressure 121/74, heart rate 116, RR 34.  Chest x-ray showed low lung volume without infiltration.  CT abdomen/pelvis negative for acute intra-abdominal issues. LE venous doppler is negative for DVT. Patient is admitted to PCU as inpatient.  CT-abd/pelvis: 1. No acute intra-abdominal finding. 2.  Cholelithiasis and suspected hepatic steatosis. 3. Atelectasis at the lung bases.  CT-head: Evolving large left MCA territory infarct. No evidence of hemorrhage or acute intracranial injury.  CT-C spin: 1. No acute fracture or traumatic listhesis of the cervical spine. 2. Straightening with smooth reversal of the cervical lordosis, which may be secondary to positioning or muscle spasm.  LE venous doppler: No evidence of deep venous thrombosis in either lower extremity. Of note, incomplete evaluation was performed in the right lower Extremity.   EKG: I have personally reviewed.  Sinus rhythm, QTc 467, possible left atrial enlargement, nonspecific T wave change.   Review of Systems: Could not reviewed due to altered mental status.   Allergy:  Allergies  Allergen Reactions   Cephalexin Hives   Morphine    Wound Dressing Adhesive     No past medical history on file.  Past Surgical History:  Procedure Laterality Date   COSMETIC SURGERY     EXTERNAL FIXATION LEG      Social History:  reports that she has been smoking cigarettes. She has a 30.00 pack-year smoking history. She has never used smokeless tobacco. She reports that she does not currently use alcohol. She reports that she does not use drugs.  Family History:  Could not reviewed due to altered mental status.  Prior to Admission medications   Medication Sig Start Date End Date Taking? Authorizing Provider  Acetaminophen 500 MG capsule Take 2 tablets by mouth 3 (three) times daily as needed.    [provider]  aspirin 81 MG EC tablet Take 1 tablet by mouth  3 (three) times daily.    [provider]  buPROPion (WELLBUTRIN SR) 150 MG 12 hr tablet Take 300 mg by mouth daily.    [provider]  clonazePAM (KLONOPIN) 0.5 MG tablet Take 0.5 mg by mouth daily as needed. 10/22/21   [provider]  diclofenac Sodium (VOLTAREN) 1 % GEL Apply 1 Application topically in the morning and at  bedtime. 05/06/22   [provider]  diphenhydrAMINE (BENADRYL) 25 MG tablet Take 25 mg by mouth 2 (two) times daily as needed.    [provider]  doxycycline (VIBRA-TABS) 100 MG tablet Take 1 tablet (100 mg total) by mouth 2 (two) times daily. 09/09/22   Vivi Barrack, DPM  DULoxetine (CYMBALTA) 60 MG capsule Take 60 mg by mouth 2 (two) times daily. 08/18/21   [provider]  gabapentin (NEURONTIN) 300 MG capsule Take 600 mg by mouth 3 (three) times daily. 10/12/21   [provider]  ketoconazole (NIZORAL) 2 % shampoo Apply 1 application. topically 2 (two) times a week.    [provider]  mirtazapine (REMERON) 30 MG tablet Take 30 mg by mouth at bedtime. 06/15/22   [provider]  Multiple Vitamins-Minerals (MULTIVITAMIN WITH MINERALS) tablet Take 1 tablet by mouth daily.    [provider]  naproxen sodium (ALEVE) 220 MG tablet Take 220 mg by mouth daily as needed.    [provider]  oxyCODONE-acetaminophen (PERCOCET/ROXICET) 5-325 MG tablet Take 1 tablet by mouth every 8 (eight) hours. 04/11/22   [provider]  polyethylene glycol (MIRALAX / GLYCOLAX) 17 g packet Take 17 g by mouth daily as needed.    [provider]  senna (SENOKOT) 8.6 MG TABS tablet Take 1 tablet by mouth daily as needed for mild constipation.    [provider]  triamcinolone cream (KENALOG) 0.1 % Apply 1 application. topically 2 (two) times daily.    [provider]    Physical Exam: Vitals:   10/03/22 0900 10/03/22 0945 10/03/22 1000 10/03/22 1157  BP:   103/83 131/77  Pulse: 95 87 93 79  Resp: 20 (!) 28 20 20   Temp:    99 F (37.2 C)  TempSrc:    Oral  SpO2: 97% 97% 98% 96%  Weight:      Height:       General: Not in acute distress HEENT:       Eyes: PERRL, EOMI, no scleral icterus.       ENT: No discharge from the ears and nose\       Neck: No JVD, no bruit, no mass felt. Heme: No neck lymph  node enlargement. Cardiac: S1/S2, RRR, No murmurs, No gallops or rubs. Respiratory: Has coarse breathing sounds bilaterally GI: Soft, nondistended, nontender, no organomegaly, BS present. GU: No hematuria Ext: No pitting leg edema bilaterally. 1+DP/PT pulse bilaterally. Musculoskeletal: No joint deformities, No joint redness or warmth Skin: No rashes.  Neuro: Confused, arousable, not following command, not oriented x3, with aphasia and right-sided weakness. Psych: Patient is not psychotic, no suicidal or hemocidal ideation.  Labs on Admission: I have personally reviewed following labs and imaging studies  CBC: Recent Labs  Lab 10/03/22 0407  WBC 9.3  NEUTROABS 7.6  HGB 12.2  HCT 33.8*  MCV 86.9  PLT 194   Basic Metabolic Panel: Recent Labs  Lab 10/03/22 0401 10/03/22 0407  NA  --  138  K  --  3.2*  CL  --  104  CO2  --  23  GLUCOSE  --  109*  BUN  --  16  CREATININE  --  0.70  CALCIUM  --  8.5*  MG 1.8  --    GFR: Estimated Creatinine Clearance: 90.2 mL/min (by C-G formula based on SCr of 0.7 mg/dL). Liver Function Tests: Recent Labs  Lab 10/03/22 0407  AST 25  ALT 23  ALKPHOS 129*  BILITOT 0.6  PROT 7.6  ALBUMIN 3.6   Recent Labs  Lab 10/03/22 0407  LIPASE 26   No results for input(s): "AMMONIA" in the last 168 hours. Coagulation Profile: Recent Labs  Lab 10/03/22 0407  INR 1.1   Cardiac Enzymes: No results for input(s): "CKTOTAL", "CKMB", "CKMBINDEX", "TROPONINI" in the last 168 hours. BNP (last 3 results) No results for input(s): "PROBNP" in the last 8760 hours. HbA1C: No results for input(s): "HGBA1C" in the last 72 hours. CBG: No results for input(s): "GLUCAP" in the last 168 hours. Lipid Profile: No results for input(s): "CHOL", "HDL", "LDLCALC", "TRIG", "CHOLHDL", "LDLDIRECT" in the last 72 hours. Thyroid Function Tests: No results for input(s): "TSH", "T4TOTAL", "FREET4", "T3FREE", "THYROIDAB" in the last 72 hours. Anemia Panel: No  results for input(s): "VITAMINB12", "FOLATE", "FERRITIN", "TIBC", "IRON", "RETICCTPCT" in the last 72 hours. Urine analysis:    Component Value Date/Time   COLORURINE AMBER (A) 10/03/2022 0407   APPEARANCEUR CLOUDY (A) 10/03/2022 0407   APPEARANCEUR Cloudy 08/30/2014 1510   LABSPEC 1.027 10/03/2022 0407   LABSPEC 1.010 08/30/2014 1510   PHURINE 5.0 10/03/2022 0407   GLUCOSEU NEGATIVE 10/03/2022 0407   GLUCOSEU Negative 08/30/2014 1510   HGBUR MODERATE (A) 10/03/2022 0407   BILIRUBINUR NEGATIVE 10/03/2022 0407   BILIRUBINUR Negative 08/30/2014 1510   KETONESUR NEGATIVE 10/03/2022 0407   PROTEINUR 30 (A) 10/03/2022 0407   NITRITE NEGATIVE 10/03/2022 0407   LEUKOCYTESUR TRACE (A) 10/03/2022 0407   LEUKOCYTESUR Negative 08/30/2014 1510   Sepsis Labs: @LABRCNTIP (procalcitonin:4,lacticidven:4) ) Recent Results (from the past 240 hour(s))  Resp Panel by RT-PCR (Flu A&B, Covid) Anterior Nasal Swab     Status: Abnormal   Collection Time: 10/03/22  4:01 AM   Specimen: Anterior Nasal Swab  Result Value Ref Range Status   SARS Coronavirus 2 by RT PCR POSITIVE (A) NEGATIVE Final    Comment: (NOTE) SARS-CoV-2 target nucleic acids are DETECTED.  The SARS-CoV-2 RNA is generally detectable in upper respiratory specimens during the acute phase of infection. Positive results are indicative of the presence of the identified virus, but do not rule out bacterial infection or co-infection with other pathogens not detected by the test. Clinical correlation with patient history and other diagnostic information is necessary to determine patient infection status. The expected result is Negative.  Fact Sheet for Patients: 10/05/22  Fact Sheet for Healthcare Providers: BloggerCourse.com  This test is not yet approved or cleared by the [employer] FDA and  has been authorized for detection and/or diagnosis of SARS-CoV-2 by FDA under an  Emergency Use Authorization (EUA).  This EUA will remain in effect (meaning this test can be used) for the duration of  the COVID-19 declaration under Section 564(b)(1) of the A ct, 21 U.S.C. section 360bbb-3(b)(1), unless the authorization is terminated or revoked sooner.     Influenza A by PCR NEGATIVE NEGATIVE Final   Influenza B by PCR NEGATIVE NEGATIVE Final    Comment: (NOTE) The Xpert Xpress SARS-CoV-2/FLU/RSV plus assay is intended as an aid in the diagnosis of influenza from Nasopharyngeal swab specimens and should not be used as  a sole basis for treatment. Nasal washings and aspirates are unacceptable for Xpert Xpress SARS-CoV-2/FLU/RSV testing.  Fact Sheet for Patients: BloggerCourse.com  Fact Sheet for Healthcare Providers: SeriousBroker.it  This test is not yet approved or cleared by the [place] FDA and has been authorized for detection and/or diagnosis of SARS-CoV-2 by FDA under an Emergency Use Authorization (EUA). This EUA will remain in effect (meaning this test can be used) for the duration of the COVID-19 declaration under Section 564(b)(1) of the Act, 21 U.S.C. section 360bbb-3(b)(1), unless the authorization is terminated or revoked.  Performed at [facility], [address] [place], [place] [address]      Radiological Exams on Admission: CT CERVICAL SPINE WO CONTRAST  Result Date: 10/03/2022 CLINICAL DATA:  Neck trauma, fall EXAM: CT CERVICAL SPINE WITHOUT CONTRAST TECHNIQUE: Multidetector CT imaging of the cervical spine was performed without intravenous contrast. Multiplanar CT image reconstructions were also generated. RADIATION DOSE REDUCTION: This exam was performed according to the departmental dose-optimization program which includes automated exposure control, adjustment of the mA and/or kV according to patient size and/or use of iterative reconstruction technique. COMPARISON:   03/27/2020 FINDINGS: Alignment: Facet joints are aligned without dislocation or traumatic listhesis. Dens and lateral masses are aligned. Straightening with smooth reversal of the cervical lordosis. Skull base and vertebrae: No acute fracture. No primary bone lesion or focal pathologic process. Soft tissues and spinal canal: No prevertebral fluid or swelling. No visible canal hematoma. Disc levels:  Degenerative disc disease of C5-6 is similar to prior. Upper chest: Hypoventilatory changes within the included lung fields. Other: None. IMPRESSION: 1. No acute fracture or traumatic listhesis of the cervical spine. 2. Straightening with smooth reversal of the cervical lordosis, which may be secondary to positioning or muscle spasm. Electronically Signed   By: Duanne Guess D.O.   On: 10/03/2022 11:39   CT HEAD WO CONTRAST ( )  Result Date: 10/03/2022 CLINICAL DATA:  Trauma EXAM: CT HEAD WITHOUT CONTRAST TECHNIQUE: Contiguous axial images were obtained from the base of the skull through the vertex without intravenous contrast. RADIATION DOSE REDUCTION: This exam was performed according to the departmental dose-optimization program which includes automated exposure control, adjustment of the mA and/or kV according to patient size and/or use of iterative reconstruction technique. COMPARISON:  CT head 03/27/20 FINDINGS: Brain: Evolving large left MCA territory infarct involving large portions of the left frontal, parietal, and anterior temporal lobes. No evidence of hemorrhage. No hydrocephalus. No extra-axial fluid collection. There is mild ex vacuo dilatation of the left lateral ventricular system. Vascular: No hyperdense vessel or unexpected calcification. Skull: Prior maxillary and mandibular fixation changes. Sinuses/Orbits: Mild mucosal thickening bilateral maxillary sinuses. There is a chronic fracture of the lateral wall of the left maxillary sinus. Impacted cerumen in bilateral external auditory canals.  Other: None. IMPRESSION: Evolving large left MCA territory infarct. No evidence of hemorrhage or acute intracranial injury. Electronically Signed   By: Lorenza Cambridge M.D.   On: 10/03/2022 11:38   CT ABDOMEN PELVIS W CONTRAST  Result Date: 10/03/2022 CLINICAL DATA:  Abdominal pain, acute, nonlocalized EXAM: CT ABDOMEN AND PELVIS WITH CONTRAST TECHNIQUE: Multidetector CT imaging of the abdomen and pelvis was performed using the standard protocol following bolus administration of intravenous contrast. RADIATION DOSE REDUCTION: This exam was performed according to the departmental dose-optimization program which includes automated exposure control, adjustment of the mA and/or kV according to patient size and/or use of iterative reconstruction technique. CONTRAST:  OMNIPAQUE IOHEXOL 300 MG/ML  SOLN COMPARISON:  08/30/2014 FINDINGS: Lower chest: Dependent atelectasis. Symmetric inflation of the covered breast implants. Hepatobiliary: There could be hepatic steatosis but limited by post contrast timing.Cholelithiasis. No evidence of biliary inflammation. Pancreas: Unremarkable. Spleen: Unremarkable. Adrenals/Urinary Tract: Negative adrenals. No hydronephrosis or stone. Unremarkable bladder. Stomach/Bowel:  No obstruction. No appendicitis. Vascular/Lymphatic: No acute vascular abnormality. No mass or adenopathy. Reproductive:No pathologic findings. Other: No ascites or pneumoperitoneum. Musculoskeletal: No acute abnormalities. Femoral neck fixation on both sides. IMPRESSION: 1. No acute intra-abdominal finding. 2. Cholelithiasis and suspected hepatic steatosis. 3. Atelectasis at the lung bases. Electronically Signed   By: Tiburcio Pea M.D.   On: 10/03/2022 06:29   DG Chest Port 1 View  Result Date: 10/03/2022 CLINICAL DATA:  Fall from bed.  Questionable sepsis EXAM: PORTABLE CHEST 1 VIEW COMPARISON:  07/17/2020 FINDINGS: Limited low volume chest with diffuse interstitial crowding. Linear atelectasis on  the right. Cardiopericardial enlargement. No visible effusion or pneumothorax. Extensive artifact from EKG leads. IMPRESSION: Very low volume chest with atelectasis.  No convincing pneumonia. Electronically Signed   By: Tiburcio Pea M.D.   On: 10/03/2022 04:42      Assessment/Plan Principal Problem:   Acute respiratory disease due to COVID-19 virus Active Problems:   Severe sepsis (HCC)   History of stroke   Depression with anxiety   Tobacco abuse   Hypokalemia   Fall at home, initial encounter   Acute metabolic encephalopathy   Obesity with body mass index (BMI) of 30.0 to 39.9   Assessment and Plan:  Acute respiratory disease due to COVID-19 virus and severe sepsis due to covid19 infection: Patient has 3 L of new oxygen requirement.  Patient meets criteria for sepsis with heart rate 116, RR 34, fever of one 1.1.  Lactic acid 2.5 --> 1.1  -will admitted to PCU as inpatient -Paxlovid -Bronchodilators -Mucinex -Nasal cannula oxygen -IVF: 1L of NS, then 75 cc of LR -Check procalcitonin level -Check CRP level  History of stroke -Aspirin  Depression with anxiety -Continue home medications  Tobacco abuse -Nicotine patch  Hypokalemia -Repleted potassium -Check magnesium level --> 1.8  Fall at home, initial encounter: CT of head and CT of the C-spine is negative for acute injury.  Patient cannot tell if she has any injury due to altered mental status.  When patient's mental status improves, will need further evaluation and examination to make sure patient does not have any injury or need any more images. -fall precaution  Acute metabolic encephalopathy:  -Frequent neurochecks  Obesity with body mass index (BMI) of 30.0 to 39.9: BMI= 31.62  and BW= 86.2 -Diet and exercise.   -Encourage to lose weight.  Positive urinalysis: Patient has positive urinalysis, but with squamous cell contamination.  Not sure if patient has UTI.  Since patient has sepsis and altered mental  status.  We will continue antibiotics.  Patient received 1 dose of aztreonam and vancomycin in ED. -start IV rocephin -f/u urine cultre      DVT ppx: SCD (not sure if pt has any injury, pt is altered)  Code Status: Full code per her POA  Family Communication:   Yes, patient's POA by phone  Disposition Plan:  Anticipate discharge back to previous environment  Consults called:  none  Admission status and Level of care: Progressive:   as inpt      Dispo: The patient is from: Home              Anticipated d/c is to:  to be  determined              Anticipated d/c date is: 2 days              Patient currently is not medically stable to d/c.    Severity of Illness:  The appropriate patient status for this patient is INPATIENT. Inpatient status is judged to be reasonable and necessary in order to provide the required intensity of service to ensure the patient's safety. The patient's presenting symptoms, physical exam findings, and initial radiographic and laboratory data in the context of their chronic comorbidities is felt to place them at high risk for further clinical deterioration. Furthermore, it is not anticipated that the patient will be medically stable for discharge from the hospital within 2 midnights of admission.   * I certify that at the point of admission it is my clinical judgment that the patient will require inpatient hospital care spanning beyond 2 midnights from the point of admission due to high intensity of service, high risk for further deterioration and high frequency of surveillance required.*       Date of Service 10/03/2022    Lorretta Harp [facility]   If 7PM-7AM, please contact night-coverage [email or web address] 10/03/2022, 12:18 PM

## 2022-10-03 NOTE — Progress Notes (Signed)
PHARMACY -  BRIEF ANTIBIOTIC NOTE   Pharmacy has received consult(s) for Aztreonam & Vancomycin from an ED provider.  The patient's profile has been reviewed for ht/wt/allergies/indication/available labs.    One time order(s) placed for Aztreonam 2 gm & Vancomycin 2 gm per pt wt of 98.6 kg from 08/26/22  Further antibiotics/pharmacy consults should be ordered by admitting physician if indicated.                       Thank you, Otelia Sergeant, PharmD, [facility] 10/03/2022 4:10 AM

## 2022-10-03 NOTE — ED Provider Notes (Signed)
[facility] Provider Note    Event Date/Time   First MD Initiated Contact with Patient 10/03/22 [phone]     (approximate)  History   Chief Complaint: Respiratory distress, abdominal pain  HPI  Gloria Aguilar is a 51 y.o. female with a past medical history of prior CVA with right-sided paralysis presents to the emergency department for respiratory distress and abdominal pain.  According to EMS report they were called out by the patient's daughter for reported abdominal pain and respiratory distress.  Daughter states the patient has been breathing like this "for a while" but per EMS could not quantify.  Patient noted to be quite tachypneic with audible rhonchi.  Patient states shortness of breath.  Denies any chest pain.  Patient does state that abdominal pain.  Negative for vomiting or diarrhea.  Negative for known fever.  Patient is awake alert able to answer questions appropriately.  Physical Exam   Triage Vital Signs: ED Triage Vitals  Enc Vitals Group     BP      Pulse      Resp      Temp      Temp src      SpO2      Weight      Height      Head Circumference      Peak Flow      Pain Score      Pain Loc      Pain Edu?      Excl. in [place]?     Most recent vital signs: There were no vitals filed for this visit.  General: Awake, no distress.  Audible rhonchi.  Moderate tachypnea. CV:  Good peripheral perfusion.  Regular rate and rhythm  Resp:  Moderate tachypnea with bilateral rhonchi. Abd:  No distention.  Soft, mild diffuse tenderness without focal tenderness identified. Other:  Right-sided paralysis.   ED Results / Procedures / Treatments   EKG  EKG viewed and interpreted by myself shows sinus tachycardia 113 bpm with a narrow QRS, normal axis, normal intervals, no concerning ST changes.  RADIOLOGY  I have reviewed and interpreted the chest x-ray, low inspiration but no obvious consolidation on my evaluation. Radiology has read the x-ray is  negative for pneumonia.   MEDICATIONS ORDERED IN ED: Medications  lactated ringers infusion (has no administration in time range)  sodium chloride 0.9 % bolus 1,000 mL (has no administration in time range)  aztreonam (AZACTAM) 2 g in sodium chloride 0.9 % 100 mL IVPB (has no administration in time range)  metroNIDAZOLE (FLAGYL) IVPB 500 mg (has no administration in time range)  vancomycin (VANCOCIN) IVPB 1000 mg/200 mL premix (has no administration in time range)     IMPRESSION / MDM / ASSESSMENT AND PLAN / ED COURSE  I reviewed the triage vital signs and the nursing notes.  Patient's presentation is most consistent with acute presentation with potential threat to life or bodily function.  Patient presents emergency department via EMS for respiratory distress and abdominal pain.  Patient is quite tachypneic with audible rhonchi concerning for possible pneumonia given the patient's history of CVA and right-sided paralysis concern for aspiration pneumonia as well.  We will also obtain a COVID/flu swab.  Given the patient's abdominal pain we will obtain labs including CBC chemistry lipase.  We will begin IV hydration while awaiting further results.  We will check labs, cultures and begin broad-spectrum antibiotics.  Patient's labs have resulted showing an elevated lactic acid  of 2.5.  Overall reassuring chemistry.  Overall normal CBC.  Urinalysis does not appear to show any significant infection.  Lipase is normal.  Troponin negative.  Patient's COVID test has resulted positive.  Patient is hypoxic currently 86% on room air, placed on 2 L nasal cannula.  We will admit to the hospitalist service for ongoing work-up and treatment.  We will also dose Decadron in addition to Tylenol and continue to closely monitor.  CRITICAL CARE Performed by: Minna Antis   Total critical care time: 30 minutes  Critical care time was exclusive of separately billable procedures and treating other  patients.  Critical care was necessary to treat or prevent imminent or life-threatening deterioration.  Critical care was time spent personally by me on the following activities: development of treatment plan with patient and/or surrogate as well as nursing, discussions with consultants, evaluation of patient's response to treatment, examination of patient, obtaining history from patient or surrogate, ordering and performing treatments and interventions, ordering and review of laboratory studies, ordering and review of radiographic studies, pulse oximetry and re-evaluation of patient's condition.   FINAL CLINICAL IMPRESSION(S) / ED DIAGNOSES   Sepsis Respiratory failure secondary to COVID-19  Note:  This document was prepared using Dragon voice recognition software and may include unintentional dictation errors.   Minna Antis, MD 10/03/22 [phone]

## 2022-10-03 NOTE — ED Notes (Signed)
Patient transported to CT 

## 2022-10-03 NOTE — ED Notes (Signed)
Pt had 1 medium BM. Pt cleaned, new brief on pt, new chucks pad under pt, new purewick on pt. Pt repositioned in bed for comfort. Remote provided to pt. WCTM.

## 2022-10-03 NOTE — ED Notes (Signed)
Pillows placed under right side of pt

## 2022-10-03 NOTE — ED Notes (Signed)
Pt to CT at this time.

## 2022-10-03 NOTE — ED Notes (Signed)
Pt transported to admission room 255

## 2022-10-03 NOTE — ED Triage Notes (Signed)
Arrived via EMS  with resp distress.  EMS reports that pt has been home x2 days from rehab following CVA.  Pt fell out of bed last night.3:58 AM c/o abd pain and dyspnea.  EMS gave DuoNeb x2 and pt is on 6lpm via [place] upon arrival.  IV in left wrist started by EMS.

## 2022-10-03 NOTE — Sepsis Progress Note (Signed)
Elink following code sepsis °

## 2022-10-03 NOTE — Progress Notes (Signed)
CODE SEPSIS - PHARMACY COMMUNICATION  **Broad Spectrum Antibiotics should be administered within 1 hour of Sepsis diagnosis**  Time Code Sepsis Called/Page Received: 0402  Antibiotics Ordered: Aztreonam, Vancomycin, Flagyl  Time of 1st antibiotic administration: 0453  Otelia Sergeant, PharmD, [facility] 10/03/2022 4:09 AM

## 2022-10-03 NOTE — ED Notes (Signed)
Unable to obtain 2nd IV access after numerous attempts by three Rns.   Order placed for IV team. Unable to obtain blood cultures as each attempt at access blows.   Spoke with Dr Lenard Lance who okayed attempts in feet.  One attempt in each foot unsuccessful.  IV ABX started despite not having blood cultures drawn.  Able to obtain first set of cultures from left hand after first ABX almost completed.   O2 placed at 2lpm via [place] forSaO2 of 88% on room air while pt sleeping.

## 2022-10-04 ENCOUNTER — Ambulatory Visit (HOSPITAL_COMMUNITY): Admission: RE | Admit: 2022-10-04 | Payer: Medicaid Other | Source: Ambulatory Visit

## 2022-10-04 DIAGNOSIS — U071 COVID-19: Secondary | ICD-10-CM

## 2022-10-04 DIAGNOSIS — J069 Acute upper respiratory infection, unspecified: Secondary | ICD-10-CM | POA: Diagnosis not present

## 2022-10-04 LAB — BASIC METABOLIC PANEL
Anion gap: 8 (ref 5–15)
BUN: 16 mg/dL (ref 6–20)
CO2: 21 mmol/L — ABNORMAL LOW (ref 22–32)
Calcium: 8.9 mg/dL (ref 8.9–10.3)
Chloride: 114 mmol/L — ABNORMAL HIGH (ref 98–111)
Creatinine, Ser: 0.44 mg/dL (ref 0.44–1.00)
GFR, Estimated: >60 mL/min (ref >60)
Glucose, Bld: 155 mg/dL — ABNORMAL HIGH (ref 70–99)
Potassium: 3.8 mmol/L (ref 3.5–5.1)
Sodium: 143 mmol/L (ref 135–145)

## 2022-10-04 LAB — URINE CULTURE

## 2022-10-04 LAB — HIV ANTIBODY (ROUTINE TESTING W REFLEX): HIV Screen 4th Generation wRfx: NONREACTIVE

## 2022-10-04 MED ORDER — ALBUTEROL SULFATE HFA 108 (90 BASE) MCG/ACT IN AERS: 2.0000 | INHALATION_SPRAY | RESPIRATORY_TRACT | 1 refills | Status: AC | PRN

## 2022-10-04 MED ORDER — NICOTINE 21 MG/24HR TD PT24: 21.0000 mg | MEDICATED_PATCH | Freq: Every day | TRANSDERMAL | 0 refills | Status: DC

## 2022-10-04 MED ORDER — FOSFOMYCIN TROMETHAMINE 3 G PO PACK
3.0000 g | PACK | Freq: Once | ORAL | Status: AC
  Administered 2022-10-04: 3 g via ORAL
  Filled 2022-10-04: qty 3

## 2022-10-04 MED ORDER — PREDNISONE 50 MG PO TABS: ORAL_TABLET | ORAL | 0 refills | Status: DC

## 2022-10-04 MED ORDER — NIRMATRELVIR/RITONAVIR (PAXLOVID)TABLET: 3.0000 | ORAL_TABLET | Freq: Two times a day (BID) | ORAL | 0 refills | Status: AC

## 2022-10-04 MED ORDER — PREDNISONE 50 MG PO TABS: 50.0000 mg | ORAL_TABLET | Freq: Every day | ORAL | Status: DC

## 2022-10-04 MED ORDER — DM-GUAIFENESIN ER 30-600 MG PO TB12: 1.0000 | ORAL_TABLET | Freq: Two times a day (BID) | ORAL | 0 refills | Status: DC | PRN

## 2022-10-04 MED ORDER — STERILE WATER FOR INJECTION IJ SOLN
INTRAMUSCULAR | Status: AC
  Filled 2022-10-04: qty 10

## 2022-10-04 MED ORDER — OXYCODONE-ACETAMINOPHEN 5-325 MG PO TABS: 1.0000 | ORAL_TABLET | Freq: Three times a day (TID) | ORAL | 0 refills | Status: DC

## 2022-10-04 NOTE — Assessment & Plan Note (Signed)
Improved

## 2022-10-04 NOTE — TOC Transition Note (Signed)
Transition of Care [facility]) - CM/SW Discharge Note   Patient Details  Name: Leighla Chestnutt MRN: [id number] Date of Birth: 10/04/1971  [facility]) CM/SW Contact:  Margarito Liner, LCSW Phone Number: 10/04/2022, 5:41 PM   Clinical Narrative:   Patient has orders to discharge home today. FL2 and discharge summary sent to [facility] for them to have when she admits on 11/30. Only [facility] and [facility] have not answered for home health services. Asked MD to enter home health order for RN and aide in case one of them can accept. No further concerns. CSW signing off.  Final next level of care: Home/Self Care Barriers to Discharge: No Barriers Identified   Patient Goals and CMS Choice        Discharge Placement                Patient to be transferred to facility by: Daughter Name of family member notified: Cain Saupe Patient and family notified of of transfer: 10/04/22  Discharge Plan and Services     Post Acute Care Choice: Home Health, Durable Medical Equipment          DME Arranged: Lightweight manual wheelchair with seat cushion, Other see comment Michiel Sites lift) DME Agency: [employer] Date DME Agency Contacted: 10/04/22   Representative spoke with at DME Agency: Shaune Leeks            Social Determinants of Health (SDOH) Interventions Food Insecurity Interventions: Intervention Not Indicated Housing Interventions: Intervention Not Indicated   Readmission Risk Interventions     No data to display
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
## 2022-10-04 NOTE — Assessment & Plan Note (Signed)
Resolved with repletion -Monitor potassium and replete as needed

## 2022-10-04 NOTE — Hospital Course (Addendum)
Taken from H&P.   Gloria Aguilar is a 51 y.o. 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  History was obtained from POA over the phone.  Per her power of attorney, patient has cough, shortness breath, fever, chills in the past several days.  Patient did not complain of chest pain at home.  Patient was found to have oxygen desaturation to 86% on room air, which improved to 100% on 3 L oxygen.  Patient has generalized weakness and fell out of the bed yesterday.  Not sure if patient has any injury.  No active nausea vomiting, diarrhea noted.  Not sure if patient has symptoms of UTI.  At her normal baseline, patient is alert, orientated x3, but today patient is altered, not oriented x 3.  Patient has chronic aphasia and right-sided weakness.   Data reviewed and ED Course: pt was found to have positive COVID PCR, WBC 9.3, lactic acid 2.5, 1.1, INR 1.1, troponin level 3, lipase 26, liver function normal, urinalysis (cloudy appearance, trace amount of leukocyte, negative bacteria, WBC 11-20, with squamous cell contamination 11/20), potassium 3.2, GFR> 60.  Temperature one 1.1, blood pressure 121/74, heart rate 116, RR 34.  Chest x-ray showed low lung volume without infiltration.  CT abdomen/pelvis negative for acute intra-abdominal issues. LE venous doppler is negative for DVT.  CT head with Evolving large left MCA territory infarct. No evidence of hemorrhage or acute intracranial injury.  Patient was started on Paxlovid, steroid and ceftriaxone for concern of UTI pending urine cultures.  11/20: Vital stable, CRP was elevated at 10.6, rest of labs seems stable, preliminary blood cultures negative and urine cultures with multiple species.  Patient saturating well on room air.  No significant upper respiratory symptoms.  Urine cultures with multiple species.  Giving  1 dose of fosfomycin for daughter's concern of UTI. Swallow team evaluated her and did not found any concern. Apparently she was supposed to move to a new facility as a long-term resident, currently living with daughter after being discharged from rehab. She has to wait 10 days to complete the quarantine due to her positive COVID status now.  Patient otherwise stable and really wants to go home.  Daughter will take care of her at her place until she gets ready to go to long-term facility in 10 to 11 days.  Patient is being discharged with Paxlovid and 5 days of steroid. She is also being given some pain medications for 2 weeks to help her stay at home with daughter as she is chronically on opioids.  Patient will continue with rest of her home medications and follow-up with her providers for further recommendations.

## 2022-10-04 NOTE — Assessment & Plan Note (Signed)
-   Continue home medications 

## 2022-10-04 NOTE — Assessment & Plan Note (Signed)
Patient met sepsis criteria with COVID-19 infection.  Lactic acidosis resolved. Also concern of UTI but urine culture with multiple species. Patient lost IV line. -Give her 1 dose of fosfomycin

## 2022-10-04 NOTE — TOC CM/SW Note (Signed)
    Durable Medical Equipment  (From admission, onward)           Start     Ordered   10/04/22 1657  For home use only DME lightweight manual wheelchair with seat cushion  Once       Comments: Patient suffers from stroke which impairs their ability to perform daily activities like bathing, grooming, and toileting in the home.  A walker will not resolve  issue with performing activities of daily living. A wheelchair will allow patient to safely perform daily activities. Patient is not able to propel themselves in the home using a standard weight wheelchair due to general weakness. Patient can self propel in the lightweight wheelchair. Length of need Lifetime. Accessories: elevating leg rests (ELRs), wheel locks, extensions and anti-tippers.   10/04/22 1657   10/04/22 1656  For home use only DME Other see comment  Once       Comments: Michiel Sites lift  Question:  Length of Need  Answer:  6 Months   10/04/22 1656

## 2022-10-04 NOTE — Assessment & Plan Note (Signed)
-  Nicotine patch 

## 2022-10-04 NOTE — Assessment & Plan Note (Signed)
Continue aspirin 

## 2022-10-04 NOTE — Progress Notes (Signed)
Progress Note   Patient: Gloria Aguilar BJS:[id number] DOB: 09/30/1971 DOA: 10/03/2022     1 DOS: the patient was seen and examined on 10/04/2022   Brief hospital course: Taken from H&P.   Gloria Aguilar is a 51 y.o. 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  History was obtained from POA over the phone.  Per her power of attorney, patient has cough, shortness breath, fever, chills in the past several days.  Patient did not complain of chest pain at home.  Patient was found to have oxygen desaturation to 86% on room air, which improved to 100% on 3 L oxygen.  Patient has generalized weakness and fell out of the bed yesterday.  Not sure if patient has any injury.  No active nausea vomiting, diarrhea noted.  Not sure if patient has symptoms of UTI.  At her normal baseline, patient is alert, orientated x3, but today patient is altered, not oriented x 3.  Patient has chronic aphasia and right-sided weakness.   Data reviewed and ED Course: pt was found to have positive COVID PCR, WBC 9.3, lactic acid 2.5, 1.1, INR 1.1, troponin level 3, lipase 26, liver function normal, urinalysis (cloudy appearance, trace amount of leukocyte, negative bacteria, WBC 11-20, with squamous cell contamination 11/20), potassium 3.2, GFR> 60.  Temperature one 1.1, blood pressure 121/74, heart rate 116, RR 34.  Chest x-ray showed low lung volume without infiltration.  CT abdomen/pelvis negative for acute intra-abdominal issues. LE venous doppler is negative for DVT.  CT head with Evolving large left MCA territory infarct. No evidence of hemorrhage or acute intracranial injury.  Patient was started on Paxlovid, steroid and ceftriaxone for concern of UTI pending urine cultures.  11/20: Vital stable, CRP was elevated at 10.6, rest of labs seems stable, preliminary blood cultures  negative and urine cultures with multiple species.  Patient saturating well on room air.  No significant upper respiratory symptoms.  Urine cultures with multiple species.  Giving 1 dose of fosfomycin for daughter's concern of UTI. Swallow team evaluated her and did not found any concern. Apparently she was supposed to move to a new facility as a long-term resident, currently living with daughter after being discharged from rehab. She has to wait 10 days to complete the quarantine due to her positive COVID status now.       Assessment and Plan: * Acute respiratory disease due to COVID-19 virus Patient initially required up to 3 L of oxygen, currently weaned back to room air. CRP was elevated at 10, procalcitonin negative -Continue Paxlovid and steroid -Continue with supportive care and supplement  Severe sepsis [facility]) Patient met sepsis criteria with COVID-19 infection.  Lactic acidosis resolved. Also concern of UTI but urine culture with multiple species. Patient lost IV line. -Give her 1 dose of fosfomycin  History of stroke -Continue aspirin  Depression with anxiety -Continue home medications  Tobacco abuse -Nicotine patch  Hypokalemia Resolved with repletion -Monitor potassium and replete as needed  Fall at home, initial encounter Patient with right hemiparesis, recently discharged from rehab and currently living with daughter, plan is to go to a long-term facility tomorrow but patient pan positive for COVID so have to wait for 10 days. -Can be discharged back home with daughter with some home health services and equipment to help.  Acute metabolic encephalopathy Improved..  Obesity with body mass index (BMI) of  30.0 to 39.9 Estimated body mass index is 31.62 kg/m as calculated from the following:   Height as of this encounter: _0  (1.651 m).   Weight as of this encounter: 86.2 kg.   -This will complicate overall prognosis   Subjective: Patient was seen and  examined today.  Having some dysarthria.  On room air.  Denies any shortness of breath or pain.  Physical Exam: Vitals:   10/04/22 0300 10/04/22 0745 10/04/22 0900 10/04/22 1204  BP: 126/75 130/77  121/75  Pulse: 61 74  87  Resp: _1 Temp: 98.4 F (36.9 C) 98.3 F (36.8 C)  98.4 F (36.9 C)  TempSrc: Axillary Axillary  Oral  SpO2: 95% 95% 95% 96%  Weight:      Height:       General.  Obese lady, in no acute distress. Pulmonary.  Lungs clear bilaterally, normal respiratory effort. CV.  Regular rate and rhythm, no JVD, rub or murmur. Abdomen.  Soft, nontender, nondistended, BS positive. CNS.  Alert and oriented .  Right hemiparesis. Extremities.  No edema, no cyanosis, pulses intact and symmetrical.  Data Reviewed: Prior data reviewed.  Family Communication: Discussed with daughter on phone.  Disposition: Status is: Inpatient Remains inpatient appropriate because: Severity of illness  Planned Discharge Destination: LTAC  Time spent: 45 minutes  This record has been created using Systems analyst. Errors have been sought and corrected,but may not always be located. Such creation errors do not reflect on the standard of care.   Author: Lorella Nimrod, MD 10/04/2022 4:47 PM  For on call review [email or web address].

## 2022-10-04 NOTE — Assessment & Plan Note (Signed)
Patient with right hemiparesis, recently discharged from rehab and currently living with daughter, plan is to go to a long-term facility tomorrow but patient pan positive for COVID so have to wait for 10 days. -Can be discharged back home with daughter with some home health services and equipment to help.

## 2022-10-04 NOTE — Assessment & Plan Note (Signed)
Patient initially required up to 3 L of oxygen, currently weaned back to room air. CRP was elevated at 10, procalcitonin negative -Continue Paxlovid and steroid -Continue with supportive care and supplement
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
## 2022-10-04 NOTE — Assessment & Plan Note (Signed)
Estimated body mass index is 31.62 kg/m as calculated from the following:   Height as of this encounter: 5\' 5"  (1.651 m).   Weight as of this encounter: 86.2 kg.   -This will complicate overall prognosis

## 2022-10-04 NOTE — Discharge Summary (Signed)
Physician Discharge Summary   Patient: Gloria Aguilar MRN: [id number] DOB: 08/18/1971  Admit date:     10/03/2022  Discharge date: 10/04/22  Discharge Physician: Lorella Nimrod   PCP: Danna Hefty, DO   Recommendations at discharge:  Please ensure the completion of course of Paxlovid Follow-up with primary care provider within a week  Discharge Diagnoses: Principal Problem:   Acute respiratory disease due to COVID-19 virus Active Problems:   Severe sepsis ([place])   History of stroke   Depression with anxiety   Tobacco abuse   Hypokalemia   Fall at home, initial encounter   Acute metabolic encephalopathy   Obesity with body mass index (BMI) of 30.0 to 39.9   Hospital Course: Taken from H&P.   Gloria Aguilar is a 51 y.o. 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  History was obtained from POA over the phone.  Per her power of attorney, patient has cough, shortness breath, fever, chills in the past several days.  Patient did not complain of chest pain at home.  Patient was found to have oxygen desaturation to 86% on room air, which improved to 100% on 3 L oxygen.  Patient has generalized weakness and fell out of the bed yesterday.  Not sure if patient has any injury.  No active nausea vomiting, diarrhea noted.  Not sure if patient has symptoms of UTI.  At her normal baseline, patient is alert, orientated x3, but today patient is altered, not oriented x 3.  Patient has chronic aphasia and right-sided weakness.   Data reviewed and ED Course: pt was found to have positive COVID PCR, WBC 9.3, lactic acid 2.5, 1.1, INR 1.1, troponin level 3, lipase 26, liver function normal, urinalysis (cloudy appearance, trace amount of leukocyte, negative bacteria, WBC 11-20, with squamous cell contamination 11/20), potassium 3.2, GFR> 60.  Temperature one  1.1, blood pressure 121/74, heart rate 116, RR 34.  Chest x-ray showed low lung volume without infiltration.  CT abdomen/pelvis negative for acute intra-abdominal issues. LE venous doppler is negative for DVT.  CT head with Evolving large left MCA territory infarct. No evidence of hemorrhage or acute intracranial injury.  Patient was started on Paxlovid, steroid and ceftriaxone for concern of UTI pending urine cultures.  11/20: Vital stable, CRP was elevated at 10.6, rest of labs seems stable, preliminary blood cultures negative and urine cultures with multiple species.  Patient saturating well on room air.  No significant upper respiratory symptoms.  Urine cultures with multiple species.  Giving 1 dose of fosfomycin for daughter's concern of UTI. Swallow team evaluated her and did not found any concern. Apparently she was supposed to move to a new facility as a long-term resident, currently living with daughter after being discharged from rehab. She has to wait 10 days to complete the quarantine due to her positive COVID status now.  Patient otherwise stable and really wants to go home.  Daughter will take care of her at her place until she gets ready to go to long-term facility in 10 to 11 days.  Patient is being discharged with Paxlovid and 5 days of steroid. She is also being given some pain medications for 2 weeks to help her stay at home with daughter as she is chronically on opioids.  Patient will continue with rest of her home medications and follow-up with her providers for further recommendations.  Assessment and Plan: * Acute respiratory disease due to COVID-19 virus Patient initially required up to 3 L of oxygen, currently weaned back to room air. CRP was elevated at 10, procalcitonin negative -Continue Paxlovid and steroid -Continue with supportive care and supplement  Severe sepsis [facility]) Patient met sepsis criteria with COVID-19 infection.  Lactic acidosis resolved. Also  concern of UTI but urine culture with multiple species. Patient lost IV line. -Give her 1 dose of fosfomycin  History of stroke -Continue aspirin  Depression with anxiety -Continue home medications  Tobacco abuse -Nicotine patch  Hypokalemia Resolved with repletion -Monitor potassium and replete as needed  Fall at home, initial encounter Patient with right hemiparesis, recently discharged from rehab and currently living with daughter, plan is to go to a long-term facility tomorrow but patient pan positive for COVID so have to wait for 10 days. -Can be discharged back home with daughter with some home health services and equipment to help.  Acute metabolic encephalopathy Improved..  Obesity with body mass index (BMI) of 30.0 to 39.9 Estimated body mass index is 31.62 kg/m as calculated from the following:   Height as of this encounter: _0  (1.651 m).   Weight as of this encounter: 86.2 kg.   -This will complicate overall prognosis   Pain control - [place] Controlled Substance Reporting System database was reviewed. and patient was instructed, not to drive, operate heavy machinery, perform activities at heights, swimming or participation in water activities or provide baby-sitting services while on Pain, Sleep and Anxiety Medications; until their outpatient Physician has advised to do so again. Also recommended to not to take more than prescribed Pain, Sleep and Anxiety Medications.  Consultants: None Procedures performed: None  Disposition: Home Diet recommendation:  Discharge Diet Orders (From admission, onward)     Start     Ordered   10/04/22 0000  Diet - low sodium heart healthy        10/04/22 1728           Cardiac diet DISCHARGE MEDICATION: Allergies as of 10/04/2022       Reactions   Cephalexin Hives   Morphine    Wound Dressing Adhesive         Medication List     STOP taking these medications    buPROPion 150 MG 12 hr  tablet Commonly known as: WELLBUTRIN SR   buPROPion 300 MG 24 hr tablet Commonly known as: WELLBUTRIN XL   diclofenac Sodium 1 % Gel Commonly known as: VOLTAREN   doxycycline 100 MG tablet Commonly known as: VIBRA-TABS   ketoconazole 2 % shampoo Commonly known as: NIZORAL   triamcinolone cream 0.1 % Commonly known as: KENALOG       TAKE these medications    Acetaminophen 500 MG capsule Take 2 tablets by mouth 3 (three) times daily as needed.   albuterol 108 (90 Base) MCG/ACT inhaler Commonly known as: VENTOLIN HFA Inhale 2 puffs into the lungs every 4 (four) hours as needed for wheezing or shortness of breath.   allopurinol 100 MG tablet Commonly known as: ZYLOPRIM Take 100 mg by mouth daily.   aspirin EC 81 MG tablet Take 1 tablet by mouth 3 (three) times daily.   busPIRone 5 MG tablet Commonly known as: BUSPAR Take 5 mg by mouth 3 (three) times daily.   clonazePAM 0.5 MG tablet Commonly known as: KLONOPIN Take 1.5 mg by mouth 2 (two) times daily as needed.   dextromethorphan-guaiFENesin 30-600 MG 12hr tablet Commonly  known as: MUCINEX DM Take 1 tablet by mouth 2 (two) times daily as needed for cough.   diphenhydrAMINE 25 MG tablet Commonly known as: BENADRYL Take 25 mg by mouth 2 (two) times daily as needed.   DULoxetine 60 MG capsule Commonly known as: CYMBALTA Take 60 mg by mouth 2 (two) times daily.   gabapentin 300 MG capsule Commonly known as: NEURONTIN Take 600 mg by mouth 3 (three) times daily.   mirtazapine 30 MG tablet Commonly known as: REMERON Take 30 mg by mouth at bedtime.   multivitamin with minerals tablet Take 1 tablet by mouth daily.   naproxen sodium 220 MG tablet Commonly known as: ALEVE Take 220 mg by mouth daily as needed.   nicotine 21 mg/24hr patch Commonly known as: NICODERM CQ - dosed in mg/24 hours Place 1 patch (21 mg total) onto the skin daily. Start taking on: October 05, 2022   nirmatrelvir/ritonavir EUA 20  x 150 MG & 10 x 100MG Tabs Commonly known as: PAXLOVID Take 3 tablets by mouth 2 (two) times daily for 5 days. Patient GFR is >60. Take nirmatrelvir (150 mg) two tablets twice daily for 5 days and ritonavir (100 mg) one tablet twice daily for 5 days.   oxyCODONE-acetaminophen 5-325 MG tablet Commonly known as: PERCOCET/ROXICET Take 1 tablet by mouth every 8 (eight) hours.   polyethylene glycol 17 g packet Commonly known as: MIRALAX / GLYCOLAX Take 17 g by mouth daily as needed.   predniSONE 50 MG tablet Commonly known as: DELTASONE Take 1 tablet daily for next 5 days Start taking on: October 05, 2022   senna 8.6 MG Tabs tablet Commonly known as: SENOKOT Take 1 tablet by mouth daily as needed for mild constipation.   traZODone 150 MG tablet Commonly known as: DESYREL Take 150 mg by mouth at bedtime.               Durable Medical Equipment  (From admission, onward)           Start     Ordered   10/04/22 1657  For home use only DME lightweight manual wheelchair with seat cushion  Once       Comments: Patient suffers from stroke which impairs their ability to perform daily activities like bathing, grooming, and toileting in the home.  A walker will not resolve  issue with performing activities of daily living. A wheelchair will allow patient to safely perform daily activities. Patient is not able to propel themselves in the home using a standard weight wheelchair due to general weakness. Patient can self propel in the lightweight wheelchair. Length of need Lifetime. Accessories: elevating leg rests (ELRs), wheel locks, extensions and anti-tippers.   10/04/22 1657   10/04/22 1656  For home use only DME Other see comment  Once       Comments: Harrel Lemon lift  Question:  Length of Need  Answer:  6 Months   10/04/22 1656            Discharge Exam: Filed Weights   10/03/22 0557  Weight: 86.2 kg   General.  Ill-appearing lady, in no acute distress. Pulmonary.  Lungs  clear bilaterally, normal respiratory effort. CV.  Regular rate and rhythm, no JVD, rub or murmur. Abdomen.  Soft, nontender, nondistended, BS positive. CNS.  Alert and oriented .  Right hemiparesis Extremities.  No edema, no cyanosis, pulses intact and symmetrical. Psychiatry.  Judgment and insight appears normal.   Condition at discharge: stable  The results  of significant diagnostics from this hospitalization (including imaging, microbiology, ancillary and laboratory) are listed below for reference.   Imaging Studies: US Venous Img Lower Bilateral (DVT)  Result Date: 10/03/2022 CLINICAL DATA:  Cough, shortness of breath and fever. Fall. Question DVT. EXAM: BILATERAL LOWER EXTREMITY VENOUS DOPPLER ULTRASOUND TECHNIQUE: Gray-scale sonography with graded compression, as well as color Doppler and duplex ultrasound were performed to evaluate the lower extremity deep venous systems from the level of the common femoral vein and including the common femoral, femoral, profunda femoral, popliteal and calf veins including the posterior tibial, peroneal and gastrocnemius veins when visible. The superficial great saphenous vein was also interrogated. Spectral Doppler was utilized to evaluate flow at rest and with distal augmentation maneuvers in the common femoral, femoral and popliteal veins. COMPARISON:  None Available. FINDINGS: RIGHT LOWER EXTREMITY Common Femoral Vein: No evidence of thrombus. Normal compressibility. Patient refused color and Doppler imaging. Saphenofemoral Junction: No evidence of thrombus. Normal compressibility. Patient refused color and Doppler imaging. Profunda Femoral Vein: No evidence of thrombus. Normal compressibility. Patient refused color and Doppler imaging. Femoral Vein: No evidence of thrombus. Normal compressibility. Patient refused color and Doppler imaging. Popliteal Vein: Not imaged.  Patient refused further imaging. Calf Veins: Not imaged.  Patient refused further  imaging Superficial Great Saphenous Vein: Not imaged. Patient refused further imaging Venous Reflux:  None. Other Findings:  None. LEFT LOWER EXTREMITY Common Femoral Vein: No evidence of thrombus. Normal compressibility, respiratory phasicity and response to augmentation. Saphenofemoral Junction: No evidence of thrombus. Normal compressibility and flow on color Doppler imaging. Profunda Femoral Vein: No evidence of thrombus. Normal compressibility and flow on color Doppler imaging. Femoral Vein: No evidence of thrombus. Normal compressibility, respiratory phasicity and response to augmentation. Popliteal Vein: No evidence of thrombus. Normal compressibility, respiratory phasicity and response to augmentation. Calf Veins: No evidence of thrombus. Normal compressibility and flow on color Doppler imaging. Superficial Great Saphenous Vein: No evidence of thrombus. Normal compressibility. Venous Reflux:  None. Other Findings:  None. IMPRESSION: No evidence of deep venous thrombosis in either lower extremity. Of note, incomplete evaluation was performed in the right lower extremity as described above. Electronically Signed   By: San Morelle M.D.   On: 10/03/2022 16:23   CT CERVICAL SPINE WO CONTRAST  Result Date: 10/03/2022 CLINICAL DATA:  Neck trauma, fall EXAM: CT CERVICAL SPINE WITHOUT CONTRAST TECHNIQUE: Multidetector CT imaging of the cervical spine was performed without intravenous contrast. Multiplanar CT image reconstructions were also generated. RADIATION DOSE REDUCTION: This exam was performed according to the departmental dose-optimization program which includes automated exposure control, adjustment of the mA and/or kV according to patient size and/or use of iterative reconstruction technique. COMPARISON:  03/27/2020 FINDINGS: Alignment: Facet joints are aligned without dislocation or traumatic listhesis. Dens and lateral masses are aligned. Straightening with smooth reversal of the cervical  lordosis. Skull base and vertebrae: No acute fracture. No primary bone lesion or focal pathologic process. Soft tissues and spinal canal: No prevertebral fluid or swelling. No visible canal hematoma. Disc levels:  Degenerative disc disease of C5-6 is similar to prior. Upper chest: Hypoventilatory changes within the included lung fields. Other: None. IMPRESSION: 1. No acute fracture or traumatic listhesis of the cervical spine. 2. Straightening with smooth reversal of the cervical lordosis, which may be secondary to positioning or muscle spasm. Electronically Signed   By: Davina Poke D.O.   On: 10/03/2022 11:39   CT HEAD WO CONTRAST (5MM)  Result Date: 10/03/2022 CLINICAL DATA:  Trauma  EXAM: CT HEAD WITHOUT CONTRAST TECHNIQUE: Contiguous axial images were obtained from the base of the skull through the vertex without intravenous contrast. RADIATION DOSE REDUCTION: This exam was performed according to the departmental dose-optimization program which includes automated exposure control, adjustment of the mA and/or kV according to patient size and/or use of iterative reconstruction technique. COMPARISON:  CT head 03/27/20 FINDINGS: Brain: Evolving large left MCA territory infarct involving large portions of the left frontal, parietal, and anterior temporal lobes. No evidence of hemorrhage. No hydrocephalus. No extra-axial fluid collection. There is mild ex vacuo dilatation of the left lateral ventricular system. Vascular: No hyperdense vessel or unexpected calcification. Skull: Prior maxillary and mandibular fixation changes. Sinuses/Orbits: Mild mucosal thickening bilateral maxillary sinuses. There is a chronic fracture of the lateral wall of the left maxillary sinus. Impacted cerumen in bilateral external auditory canals. Other: None. IMPRESSION: Evolving large left MCA territory infarct. No evidence of hemorrhage or acute intracranial injury. Electronically Signed   By: Marin Roberts M.D.   On: 10/03/2022  11:38   CT ABDOMEN PELVIS W CONTRAST  Result Date: 10/03/2022 CLINICAL DATA:  Abdominal pain, acute, nonlocalized EXAM: CT ABDOMEN AND PELVIS WITH CONTRAST TECHNIQUE: Multidetector CT imaging of the abdomen and pelvis was performed using the standard protocol following bolus administration of intravenous contrast. RADIATION DOSE REDUCTION: This exam was performed according to the departmental dose-optimization program which includes automated exposure control, adjustment of the mA and/or kV according to patient size and/or use of iterative reconstruction technique. CONTRAST:  110m OMNIPAQUE IOHEXOL 300 MG/ML  SOLN COMPARISON:  08/30/2014 FINDINGS: Lower chest: Dependent atelectasis. Symmetric inflation of the covered breast implants. Hepatobiliary: There could be hepatic steatosis but limited by post contrast timing.Cholelithiasis. No evidence of biliary inflammation. Pancreas: Unremarkable. Spleen: Unremarkable. Adrenals/Urinary Tract: Negative adrenals. No hydronephrosis or stone. Unremarkable bladder. Stomach/Bowel:  No obstruction. No appendicitis. Vascular/Lymphatic: No acute vascular abnormality. No mass or adenopathy. Reproductive:No pathologic findings. Other: No ascites or pneumoperitoneum. Musculoskeletal: No acute abnormalities. Femoral neck fixation on both sides. IMPRESSION: 1. No acute intra-abdominal finding. 2. Cholelithiasis and suspected hepatic steatosis. 3. Atelectasis at the lung bases. Electronically Signed   By: JJorje GuildM.D.   On: 10/03/2022 06:29   DG Chest Port 1 View  Result Date: 10/03/2022 CLINICAL DATA:  Fall from bed.  Questionable sepsis EXAM: PORTABLE CHEST 1 VIEW COMPARISON:  07/17/2020 FINDINGS: Limited low volume chest with diffuse interstitial crowding. Linear atelectasis on the right. Cardiopericardial enlargement. No visible effusion or pneumothorax. Extensive artifact from EKG leads. IMPRESSION: Very low volume chest with atelectasis.  No convincing  pneumonia. Electronically Signed   By: JJorje GuildM.D.   On: 10/03/2022 04:42    Microbiology: Results for orders placed or performed during the hospital encounter of 10/03/22  Resp Panel by RT-PCR (Flu A&B, Covid) Anterior Nasal Swab     Status: Abnormal   Collection Time: 10/03/22  4:01 AM   Specimen: Anterior Nasal Swab  Result Value Ref Range Status   SARS Coronavirus 2 by RT PCR POSITIVE (A) NEGATIVE Final    Comment: (NOTE) SARS-CoV-2 target nucleic acids are DETECTED.  The SARS-CoV-2 RNA is generally detectable in upper respiratory specimens during the acute phase of infection. Positive results are indicative of the presence of the identified virus, but do not rule out bacterial infection or co-infection with other pathogens not detected by the test. Clinical correlation with patient history and other diagnostic information is necessary to determine patient infection status. The expected result is Negative.  Fact Sheet for Patients: EntrepreneurPulse.com.au  Fact Sheet for Healthcare Providers: IncredibleEmployment.be  This test is not yet approved or cleared by the [place] FDA and  has been authorized for detection and/or diagnosis of SARS-CoV-2 by FDA under an Emergency Use Authorization (EUA).  This EUA will remain in effect (meaning this test can be used) for the duration of  the COVID-19 declaration under Section 564(b)(1) of the A ct, 21 U.S.C. section 360bbb-3(b)(1), unless the authorization is terminated or revoked sooner.     Influenza A by PCR NEGATIVE NEGATIVE Final   Influenza B by PCR NEGATIVE NEGATIVE Final    Comment: (NOTE) The Xpert Xpress SARS-CoV-2/FLU/RSV plus assay is intended as an aid in the diagnosis of influenza from Nasopharyngeal swab specimens and should not be used as a sole basis for treatment. Nasal washings and aspirates are unacceptable for Xpert Xpress SARS-CoV-2/FLU/RSV testing.  Fact  Sheet for Patients: EntrepreneurPulse.com.au  Fact Sheet for Healthcare Providers: IncredibleEmployment.be  This test is not yet approved or cleared by the [place] FDA and has been authorized for detection and/or diagnosis of SARS-CoV-2 by FDA under an Emergency Use Authorization (EUA). This EUA will remain in effect (meaning this test can be used) for the duration of the COVID-19 declaration under Section 564(b)(1) of the Act, 21 U.S.C. section 360bbb-3(b)(1), unless the authorization is terminated or revoked.  Performed at [facility], [address], [place], [place] [address]   Urine Culture     Status: Abnormal   Collection Time: 10/03/22  4:07 AM   Specimen: In/Out Cath Urine  Result Value Ref Range Status   Specimen Description   Final    IN/OUT CATH URINE Performed at [facility], [address], [place], [place] [address]    Special Requests   Final    NONE Performed at [facility], [address] [place], [place] [address]    Culture MULTIPLE SPECIES PRESENT, SUGGEST RECOLLECTION (A)  Final   Report Status 10/04/2022 FINAL  Final  Blood Culture (routine x 2)     Status: None (Preliminary result)   Collection Time: 10/03/22  5:55 AM   Specimen: BLOOD  Result Value Ref Range Status   Specimen Description BLOOD LEFT WRIST  Final   Special Requests   Final    BOTTLES DRAWN AEROBIC AND ANAEROBIC Blood Culture results may not be optimal due to an inadequate volume of blood received in culture bottles   Culture   Final    NO GROWTH 1 DAY Performed at [facility], [address], [place], [place] [address]    Report Status PENDING  Incomplete  Blood Culture (routine x 2)     Status: None (Preliminary result)   Collection Time: 10/03/22  6:49 AM   Specimen: BLOOD  Result Value Ref Range Status   Specimen Description BLOOD LEFT UPPER ARM  Final   Special Requests   Final    BOTTLES  DRAWN AEROBIC AND ANAEROBIC Blood Culture adequate volume   Culture   Final    NO GROWTH 1 DAY Performed at [facility], [address], [place], [place] [address]    Report Status PENDING  Incomplete    Labs: CBC: Recent Labs  Lab 10/03/22 0407  WBC 9.3  NEUTROABS 7.6  HGB 12.2  HCT 33.8*  MCV 86.9  PLT 290   Basic Metabolic Panel: Recent Labs  Lab 10/03/22 0401 10/03/22 0407 10/04/22 0608  NA  --  138 143  K  --  3.2* 3.8  CL  --  104 114*  CO2  --  23 21*  GLUCOSE  --  109* 155*  BUN  --  16 16  CREATININE  --  0.70 0.44  CALCIUM  --  8.5* 8.9  MG 1.8  --   --    Liver Function Tests: Recent Labs  Lab 10/03/22 0407  AST 25  ALT 23  ALKPHOS 129*  BILITOT 0.6  PROT 7.6  ALBUMIN 3.6   CBG: No results for input(s): "GLUCAP" in the last 168 hours.  Discharge time spent: greater than 30 minutes.  This record has been created using Systems analyst. Errors have been sought and corrected,but may not always be located. Such creation errors do not reflect on the standard of care.   Signed: Lorella Nimrod, MD [facility] 10/04/2022

## 2022-10-05 ENCOUNTER — Other Ambulatory Visit (HOSPITAL_COMMUNITY): Payer: Self-pay

## 2022-10-06 ENCOUNTER — Other Ambulatory Visit (HOSPITAL_COMMUNITY): Payer: Self-pay

## 2022-10-06 NOTE — Consult Note (Addendum)
Called patient's daughter. She got the lock in thing for medicaid removed, but still needs a PA for the oxycodone prescription. I called pharmacy and they can't fax me to the PA so I can have a MD sign it. They faxed the PA to the number listed on the script. I am not sure what number that is. Pharmacy ([facility]) was not really helpful and just told me to call medicaid. I tried to call medicaid but wasn't able to reach anybody.   Our techs helped with the PA. Called the [facility] and confirmed it went through and then called the patient's daughter that [facility] told me it went through.   Paschal Dopp, PharmD, BCPS

## 2022-10-08 LAB — CULTURE, BLOOD (ROUTINE X 2)
Culture: NO GROWTH
Culture: NO GROWTH
Special Requests: ADEQUATE

## 2022-10-09 ENCOUNTER — Other Ambulatory Visit: Payer: Self-pay

## 2022-10-09 ENCOUNTER — Inpatient Hospital Stay
Admission: EM | Admit: 2022-10-09 | Discharge: 2022-10-18 | DRG: 194 | Disposition: A | Discharge status: Skilled Nursing Facility | Payer: Medicaid Other | Attending: Internal Medicine | Admitting: Internal Medicine

## 2022-10-09 ENCOUNTER — Emergency Department: Payer: Medicaid Other

## 2022-10-09 ENCOUNTER — Encounter: Payer: Self-pay | Admitting: Radiology

## 2022-10-09 DIAGNOSIS — R4701 Aphasia: Secondary | ICD-10-CM | POA: Diagnosis present

## 2022-10-09 DIAGNOSIS — K5903 Drug induced constipation: Secondary | ICD-10-CM | POA: Diagnosis present

## 2022-10-09 DIAGNOSIS — F17210 Nicotine dependence, cigarettes, uncomplicated: Secondary | ICD-10-CM | POA: Diagnosis present

## 2022-10-09 DIAGNOSIS — J22 Unspecified acute lower respiratory infection: Secondary | ICD-10-CM | POA: Diagnosis present

## 2022-10-09 DIAGNOSIS — R197 Diarrhea, unspecified: Secondary | ICD-10-CM | POA: Diagnosis present

## 2022-10-09 DIAGNOSIS — Z885 Allergy status to narcotic agent status: Secondary | ICD-10-CM

## 2022-10-09 DIAGNOSIS — E669 Obesity, unspecified: Secondary | ICD-10-CM | POA: Diagnosis present

## 2022-10-09 DIAGNOSIS — R1084 Generalized abdominal pain: Secondary | ICD-10-CM

## 2022-10-09 DIAGNOSIS — S069XAS Unspecified intracranial injury with loss of consciousness status unknown, sequela: Secondary | ICD-10-CM

## 2022-10-09 DIAGNOSIS — J189 Pneumonia, unspecified organism: Secondary | ICD-10-CM | POA: Diagnosis not present

## 2022-10-09 DIAGNOSIS — S069X9S Unspecified intracranial injury with loss of consciousness of unspecified duration, sequela: Secondary | ICD-10-CM | POA: Diagnosis not present

## 2022-10-09 DIAGNOSIS — Z9882 Breast implant status: Secondary | ICD-10-CM

## 2022-10-09 DIAGNOSIS — R000 Tachycardia, unspecified: Secondary | ICD-10-CM | POA: Diagnosis present

## 2022-10-09 DIAGNOSIS — U099 Post covid-19 condition, unspecified: Secondary | ICD-10-CM | POA: Diagnosis present

## 2022-10-09 DIAGNOSIS — M25562 Pain in left knee: Secondary | ICD-10-CM | POA: Diagnosis present

## 2022-10-09 DIAGNOSIS — J9811 Atelectasis: Secondary | ICD-10-CM | POA: Diagnosis not present

## 2022-10-09 DIAGNOSIS — K5900 Constipation, unspecified: Secondary | ICD-10-CM | POA: Clinically undetermined

## 2022-10-09 DIAGNOSIS — Z515 Encounter for palliative care: Secondary | ICD-10-CM

## 2022-10-09 DIAGNOSIS — Z79899 Other long term (current) drug therapy: Secondary | ICD-10-CM

## 2022-10-09 DIAGNOSIS — Z6832 Body mass index (BMI) 32.0-32.9, adult: Secondary | ICD-10-CM

## 2022-10-09 DIAGNOSIS — R0600 Dyspnea, unspecified: Secondary | ICD-10-CM

## 2022-10-09 DIAGNOSIS — F418 Other specified anxiety disorders: Secondary | ICD-10-CM | POA: Diagnosis present

## 2022-10-09 DIAGNOSIS — J40 Bronchitis, not specified as acute or chronic: Secondary | ICD-10-CM

## 2022-10-09 DIAGNOSIS — T402X5A Adverse effect of other opioids, initial encounter: Secondary | ICD-10-CM | POA: Diagnosis present

## 2022-10-09 DIAGNOSIS — F32A Depression, unspecified: Secondary | ICD-10-CM | POA: Diagnosis present

## 2022-10-09 DIAGNOSIS — Z7982 Long term (current) use of aspirin: Secondary | ICD-10-CM

## 2022-10-09 DIAGNOSIS — Z888 Allergy status to other drugs, medicaments and biological substances status: Secondary | ICD-10-CM

## 2022-10-09 DIAGNOSIS — S069XAA Unspecified intracranial injury with loss of consciousness status unknown, initial encounter: Secondary | ICD-10-CM | POA: Diagnosis present

## 2022-10-09 DIAGNOSIS — Z7409 Other reduced mobility: Secondary | ICD-10-CM | POA: Diagnosis present

## 2022-10-09 DIAGNOSIS — Y95 Nosocomial condition: Secondary | ICD-10-CM | POA: Diagnosis present

## 2022-10-09 DIAGNOSIS — Z8673 Personal history of transient ischemic attack (TIA), and cerebral infarction without residual deficits: Secondary | ICD-10-CM

## 2022-10-09 DIAGNOSIS — F419 Anxiety disorder, unspecified: Secondary | ICD-10-CM | POA: Diagnosis present

## 2022-10-09 DIAGNOSIS — M7918 Myalgia, other site: Secondary | ICD-10-CM | POA: Diagnosis present

## 2022-10-09 DIAGNOSIS — Z79891 Long term (current) use of opiate analgesic: Secondary | ICD-10-CM

## 2022-10-09 HISTORY — DX: Diarrhea, unspecified: R19.7

## 2022-10-09 LAB — COMPREHENSIVE METABOLIC PANEL
ALT: 21 U/L (ref 0–44)
AST: 16 U/L (ref 15–41)
Albumin: 3.8 g/dL (ref 3.5–5.0)
Alkaline Phosphatase: 112 U/L (ref 38–126)
Anion gap: 8 (ref 5–15)
BUN: 22 mg/dL — ABNORMAL HIGH (ref 6–20)
CO2: 30 mmol/L (ref 22–32)
Calcium: 8.8 mg/dL — ABNORMAL LOW (ref 8.9–10.3)
Chloride: 103 mmol/L (ref 98–111)
Creatinine, Ser: 0.68 mg/dL (ref 0.44–1.00)
GFR, Estimated: >60 mL/min (ref >60)
Glucose, Bld: 93 mg/dL (ref 70–99)
Potassium: 3.5 mmol/L (ref 3.5–5.1)
Sodium: 141 mmol/L (ref 135–145)
Total Bilirubin: 0.6 mg/dL (ref 0.3–1.2)
Total Protein: 7.3 g/dL (ref 6.5–8.1)

## 2022-10-09 LAB — BLOOD GAS, VENOUS
Acid-Base Excess: 9.4 mmol/L — ABNORMAL HIGH (ref 0.0–2.0)
Bicarbonate: 34.8 mmol/L — ABNORMAL HIGH (ref 20.0–28.0)
O2 Saturation: 70.2 %
Patient temperature: 37
pCO2, Ven: 49 mmHg (ref 44–60)
pH, Ven: 7.46 — ABNORMAL HIGH (ref 7.25–7.43)
pO2, Ven: 43 mmHg (ref 32–45)

## 2022-10-09 LAB — CBC
HCT: 36 % (ref 36.0–46.0)
Hemoglobin: 12.6 g/dL (ref 12.0–15.0)
MCH: 30.5 pg (ref 26.0–34.0)
MCHC: 35 g/dL (ref 30.0–36.0)
MCV: 87.2 fL (ref 80.0–100.0)
Platelets: 291 10*3/uL (ref 150–400)
RBC: 4.13 MIL/uL (ref 3.87–5.11)
RDW: 13.5 % (ref 11.5–15.5)
WBC: 20.1 10*3/uL — ABNORMAL HIGH (ref 4.0–10.5)
nRBC: 0 % (ref 0.0–0.2)

## 2022-10-09 LAB — PROCALCITONIN: Procalcitonin: <0.1 ng/mL

## 2022-10-09 LAB — BRAIN NATRIURETIC PEPTIDE: B Natriuretic Peptide: 99.5 pg/mL (ref 0.0–100.0)

## 2022-10-09 LAB — LACTIC ACID, PLASMA: Lactic Acid, Venous: 1.6 mmol/L (ref 0.5–1.9)

## 2022-10-09 LAB — MRSA NEXT GEN BY PCR, NASAL: MRSA by PCR Next Gen: NOT DETECTED

## 2022-10-09 MED ORDER — MIRTAZAPINE 15 MG PO TABS
30.0000 mg | ORAL_TABLET | Freq: Every day | ORAL | Status: DC
  Administered 2022-10-09 – 2022-10-17 (×9): 30 mg via ORAL
  Filled 2022-10-09 (×9): qty 2

## 2022-10-09 MED ORDER — GABAPENTIN 300 MG PO CAPS
600.0000 mg | ORAL_CAPSULE | Freq: Three times a day (TID) | ORAL | Status: DC
  Administered 2022-10-09 – 2022-10-18 (×26): 600 mg via ORAL
  Filled 2022-10-09 (×26): qty 2

## 2022-10-09 MED ORDER — LEVOFLOXACIN IN D5W 750 MG/150ML IV SOLN
750.0000 mg | Freq: Once | INTRAVENOUS | Status: AC
  Administered 2022-10-10: 750 mg via INTRAVENOUS
  Filled 2022-10-09 (×2): qty 150

## 2022-10-09 MED ORDER — METHYLPREDNISOLONE SODIUM SUCC 125 MG IJ SOLR
125.0000 mg | Freq: Once | INTRAMUSCULAR | Status: AC
  Administered 2022-10-09: 125 mg via INTRAVENOUS
  Filled 2022-10-09: qty 2

## 2022-10-09 MED ORDER — BUSPIRONE HCL 10 MG PO TABS
5.0000 mg | ORAL_TABLET | Freq: Three times a day (TID) | ORAL | Status: DC
  Administered 2022-10-09 – 2022-10-18 (×26): 5 mg via ORAL
  Filled 2022-10-09 (×26): qty 1

## 2022-10-09 MED ORDER — POLYETHYLENE GLYCOL 3350 17 G PO PACK: 17.0000 g | PACK | Freq: Every day | ORAL | Status: DC | PRN

## 2022-10-09 MED ORDER — VANCOMYCIN HCL 1750 MG/350ML IV SOLN
1750.0000 mg | Freq: Once | INTRAVENOUS | Status: AC
  Administered 2022-10-10: 1750 mg via INTRAVENOUS
  Filled 2022-10-09: qty 350

## 2022-10-09 MED ORDER — ASPIRIN 81 MG PO TBEC
81.0000 mg | DELAYED_RELEASE_TABLET | Freq: Three times a day (TID) | ORAL | Status: DC
  Administered 2022-10-09 – 2022-10-18 (×26): 81 mg via ORAL
  Filled 2022-10-09 (×27): qty 1

## 2022-10-09 MED ORDER — DULOXETINE HCL 30 MG PO CPEP
60.0000 mg | ORAL_CAPSULE | Freq: Two times a day (BID) | ORAL | Status: DC
  Administered 2022-10-09 – 2022-10-18 (×18): 60 mg via ORAL
  Filled 2022-10-09 (×12): qty 2
  Filled 2022-10-09: qty 1
  Filled 2022-10-09 (×5): qty 2

## 2022-10-09 MED ORDER — ONDANSETRON HCL 4 MG PO TABS: 4.0000 mg | ORAL_TABLET | Freq: Four times a day (QID) | ORAL | Status: DC | PRN

## 2022-10-09 MED ORDER — ONDANSETRON HCL 4 MG/2ML IJ SOLN: 4.0000 mg | Freq: Four times a day (QID) | INTRAMUSCULAR | Status: DC | PRN

## 2022-10-09 MED ORDER — IPRATROPIUM-ALBUTEROL 0.5-2.5 (3) MG/3ML IN SOLN
3.0000 mL | Freq: Once | RESPIRATORY_TRACT | Status: AC
  Administered 2022-10-09: 3 mL via RESPIRATORY_TRACT
  Filled 2022-10-09: qty 3

## 2022-10-09 MED ORDER — ADULT MULTIVITAMIN W/MINERALS CH
1.0000 | ORAL_TABLET | Freq: Every day | ORAL | Status: DC
  Administered 2022-10-10 – 2022-10-18 (×9): 1 via ORAL
  Filled 2022-10-09 (×9): qty 1

## 2022-10-09 MED ORDER — ACETAMINOPHEN 650 MG RE SUPP: 650.0000 mg | Freq: Four times a day (QID) | RECTAL | Status: DC | PRN

## 2022-10-09 MED ORDER — IPRATROPIUM-ALBUTEROL 0.5-2.5 (3) MG/3ML IN SOLN
3.0000 mL | Freq: Four times a day (QID) | RESPIRATORY_TRACT | Status: DC
  Administered 2022-10-09 – 2022-10-10 (×2): 3 mL via RESPIRATORY_TRACT
  Filled 2022-10-09 (×2): qty 3

## 2022-10-09 MED ORDER — NICOTINE 21 MG/24HR TD PT24
21.0000 mg | MEDICATED_PATCH | Freq: Every day | TRANSDERMAL | Status: DC
  Administered 2022-10-09 – 2022-10-18 (×10): 21 mg via TRANSDERMAL
  Filled 2022-10-09 (×11): qty 1

## 2022-10-09 MED ORDER — ALLOPURINOL 100 MG PO TABS
100.0000 mg | ORAL_TABLET | Freq: Every day | ORAL | Status: DC
  Administered 2022-10-10 – 2022-10-18 (×9): 100 mg via ORAL
  Filled 2022-10-09 (×9): qty 1

## 2022-10-09 MED ORDER — POLYETHYLENE GLYCOL 3350 17 G PO PACK
17.0000 g | PACK | Freq: Every day | ORAL | Status: DC | PRN
  Administered 2022-10-13: 17 g via ORAL
  Filled 2022-10-09: qty 1

## 2022-10-09 MED ORDER — TRAZODONE HCL 50 MG PO TABS
150.0000 mg | ORAL_TABLET | Freq: Every day | ORAL | Status: DC
  Administered 2022-10-09 – 2022-10-17 (×9): 150 mg via ORAL
  Filled 2022-10-09 (×9): qty 3

## 2022-10-09 MED ORDER — ACETAMINOPHEN 325 MG PO TABS
650.0000 mg | ORAL_TABLET | Freq: Four times a day (QID) | ORAL | Status: DC | PRN
  Filled 2022-10-09: qty 2

## 2022-10-09 MED ORDER — SODIUM CHLORIDE 0.9 % IV SOLN
2.0000 g | Freq: Three times a day (TID) | INTRAVENOUS | Status: DC
  Administered 2022-10-09 – 2022-10-11 (×5): 2 g via INTRAVENOUS
  Filled 2022-10-09: qty 2
  Filled 2022-10-09 (×4): qty 12.5
  Filled 2022-10-09: qty 2
  Filled 2022-10-09 (×2): qty 12.5

## 2022-10-09 MED ORDER — CLONAZEPAM 0.5 MG PO TABS
1.5000 mg | ORAL_TABLET | Freq: Two times a day (BID) | ORAL | Status: DC | PRN
  Administered 2022-10-09 – 2022-10-15 (×5): 1.5 mg via ORAL
  Filled 2022-10-09 (×4): qty 3
  Filled 2022-10-09: qty 1

## 2022-10-09 MED ORDER — OXYCODONE-ACETAMINOPHEN 5-325 MG PO TABS
1.0000 | ORAL_TABLET | Freq: Three times a day (TID) | ORAL | Status: DC
  Administered 2022-10-09 – 2022-10-12 (×9): 1 via ORAL
  Filled 2022-10-09 (×9): qty 1

## 2022-10-09 MED ORDER — SODIUM CHLORIDE 0.9% FLUSH
3.0000 mL | Freq: Two times a day (BID) | INTRAVENOUS | Status: DC
  Administered 2022-10-09 – 2022-10-18 (×14): 3 mL via INTRAVENOUS

## 2022-10-09 MED ORDER — ENOXAPARIN SODIUM 60 MG/0.6ML IJ SOSY
0.5000 mg/kg | PREFILLED_SYRINGE | INTRAMUSCULAR | Status: DC
  Administered 2022-10-09 – 2022-10-10 (×2): 42.5 mg via SUBCUTANEOUS
  Filled 2022-10-09 (×2): qty 0.6

## 2022-10-09 MED ORDER — IOHEXOL 350 MG/ML SOLN
75.0000 mL | Freq: Once | INTRAVENOUS | Status: AC | PRN
  Administered 2022-10-09: 75 mL via INTRAVENOUS

## 2022-10-09 NOTE — ED Notes (Signed)
Pt soiled with stool and urine, changed into clean purewick and gown

## 2022-10-09 NOTE — ED Notes (Signed)
Phlebotomy notified of blood work. Pt is a hard stick.

## 2022-10-09 NOTE — H&P (Signed)
History and Physical    Patient: Gloria Aguilar GXQ:[id number] DOB: [birth year] DOA: 10/09/2022 DOS: the patient was seen and examined on 10/09/2022 PCP: Joana Reamer, DO  Patient coming from: Home  Chief Complaint:  Chief Complaint  Patient presents with   Shortness of Breath   HPI: Gloria Aguilar is a 51 y.o. 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  History limited due to patient's baseline altered mental status in the setting of TBI.  History obtained from chart review and from patient's daughter Ms. [name].  Per chart review, patient has been experiencing worsening shortness of breath over the past 24 to 36 hours.  I discussed with patient's daughter Ms. Sharon Seller who states that patient's breathing never returned back to baseline, but it has particularly worsened in the last day or 2.  She has been coughing more often.  In addition, patient has been experiencing diarrhea since being discharged from the hospital.  When I asked Ms. Rieves how she is feeling, she repeats "bad."  She cannot articulate what is particularly bothering her but will state that and then point to her abdomen, her chest and her face.  She repeats the statement "got to go".  Per chart review, patient was recently admitted from 11/19 to 11/24 for acute hypoxic respiratory failure secondary to COVID-19.  She was discharged on room air with plans to complete Paxlovid course and 5 days of prednisone.  ED course: On arrival to the ED, patient was hypertensive at 142/108 with heart rate of 98.  She was tachypneic at 22/minute with oxygen saturation of 97%.  Due to desaturation down to 90%, she was placed on 2 L nasal cannula.  VBG with normal pCO2.  CBC with elevated WBC at 20.1.  CTA obtained that demonstrated multifocal pneumonia, but no evidence of PE.  TRH contacted  for admission for HCAP.  Review of Systems: As mentioned in the history of present illness. All other systems reviewed and are negative.  Past Medical History:  Diagnosis Date   Anxiety    Depression    Stroke [facility])    Past Surgical History:  Procedure Laterality Date   COSMETIC SURGERY     EXTERNAL FIXATION LEG     Social History:  reports that she has been smoking cigarettes. She has a 30.00 pack-year smoking history. She has never used smokeless tobacco. She reports that she does not currently use alcohol. She reports that she does not use drugs.  Allergies  Allergen Reactions   Cephalexin Hives   Morphine    Wound Dressing Adhesive     History reviewed. No pertinent family history.  Prior to Admission medications   Medication Sig Start Date End Date Taking? Authorizing Provider  Acetaminophen 500 MG capsule Take 2 tablets by mouth 3 (three) times daily as needed.    [provider]  albuterol (VENTOLIN HFA) 108 (90 Base) MCG/ACT inhaler Inhale 2 puffs into the lungs every 4 (four) hours as needed for wheezing or shortness of breath. 10/04/22   Arnetha Courser, MD  allopurinol (ZYLOPRIM) 100 MG tablet Take 100 mg by mouth daily.    [provider]  aspirin 81 MG EC tablet Take 1 tablet by mouth 3 (three) times daily.    [provider]  busPIRone (BUSPAR) 5 MG tablet Take 5 mg by mouth 3 (three) times daily.  [provider]  clonazePAM (KLONOPIN) 0.5 MG tablet Take 1.5 mg by mouth 2 (two) times daily as needed. 10/22/21   [provider]  dextromethorphan-guaiFENesin (MUCINEX DM) 30-600 MG 12hr tablet Take 1 tablet by mouth 2 (two) times daily as needed for cough. 10/04/22   Arnetha CourserAmin, Sumayya, MD  diphenhydrAMINE (BENADRYL) 25 MG tablet Take 25 mg by mouth 2 (two) times daily as needed.    [provider]  DULoxetine (CYMBALTA) 60 MG capsule Take 60 mg by mouth 2 (two) times daily. 08/18/21   [provider]  gabapentin  (NEURONTIN) 300 MG capsule Take 600 mg by mouth 3 (three) times daily. 10/12/21   [provider]  mirtazapine (REMERON) 30 MG tablet Take 30 mg by mouth at bedtime. 06/15/22   [provider]  Multiple Vitamins-Minerals (MULTIVITAMIN WITH MINERALS) tablet Take 1 tablet by mouth daily.    [provider]  naproxen sodium (ALEVE) 220 MG tablet Take 220 mg by mouth daily as needed.    [provider]  nicotine (NICODERM CQ - DOSED IN MG/24 HOURS) 21 mg/24hr patch Place 1 patch (21 mg total) onto the skin daily. 10/05/22   Arnetha CourserAmin, Sumayya, MD  nirmatrelvir/ritonavir EUA (PAXLOVID) 20 x 150 MG & 10 x 100MG  TABS Take 3 tablets by mouth 2 (two) times daily for 5 days. Patient GFR is >60. Take nirmatrelvir (150 mg) two tablets twice daily for 5 days and ritonavir (100 mg) one tablet twice daily for 5 days. 10/04/22 10/09/22  Arnetha CourserAmin, Sumayya, MD  oxyCODONE-acetaminophen (PERCOCET/ROXICET) 5-325 MG tablet Take 1 tablet by mouth every 8 (eight) hours. 10/04/22   Arnetha CourserAmin, Sumayya, MD  polyethylene glycol (MIRALAX / GLYCOLAX) 17 g packet Take 17 g by mouth daily as needed.    [provider]  predniSONE (DELTASONE) 50 MG tablet Take 1 tablet daily for next 5 days 10/05/22   Arnetha CourserAmin, Sumayya, MD  senna (SENOKOT) 8.6 MG TABS tablet Take 1 tablet by mouth daily as needed for mild constipation.    [provider]  traZODone (DESYREL) 150 MG tablet Take 150 mg by mouth at bedtime. 04/28/22   [provider]    Physical Exam: Vitals:   10/09/22 1018 10/09/22 1019  BP: (!) 142/108   Pulse: 98   Resp: (!) 22   Temp: 98.4 F (36.9 C)   SpO2: 97%   Weight:  86 kg  Height:  5\' 5"  (1.651 m)   Physical Exam Vitals and nursing note reviewed.  Constitutional:      General: She is not in acute distress.    Appearance: She is obese.  HENT:     Head: Normocephalic and atraumatic.  Cardiovascular:     Rate and Rhythm: Normal rate and regular rhythm.     Heart  sounds: No murmur heard. Pulmonary:     Breath sounds: Rhonchi (Diffuse) present. No decreased breath sounds, wheezing or rales.  Abdominal:     General: Bowel sounds are normal.     Palpations: Abdomen is soft.  Musculoskeletal:     Cervical back: Neck supple.     Right lower leg: No tenderness. No edema.     Left lower leg: No tenderness. No edema.  Skin:    General: Skin is warm and dry.  Neurological:     Mental Status: She is alert.     Comments: Patient oriented to name but disoriented to place, time and situation.  She is repeating the words "bad" and "got to go"  Moving left sided extremities independently and against gravity.  Unable to move her right upper extremity or lower extremity.  Psychiatric:        Mood and Affect: Mood is anxious.        Behavior: Behavior is cooperative.        Cognition and Memory: Cognition is impaired.    Data Reviewed: CBC with WBC of 20.1, hemoglobin of 12.6, and platelets of 291.  WBC was normal at 9.3 6 days ago.CMP with BUN of 22, calcium of 8.8, but no other electrolyte abnormalities, renal dysfunction or hepatic dysfunction noted.  BNP within normal limits.  VBG with normal pCO2.  EKG personally reviewed.  Sinus rhythm with rate of 88.  No acute ST or T wave changes suggestive of acute ischemia.  CT Angio Chest PE W and/or Wo Contrast  Result Date: 10/09/2022 CLINICAL DATA:  Recent COVID, shortness of breath EXAM: CT ANGIOGRAPHY CHEST WITH CONTRAST TECHNIQUE: Multidetector CT imaging of the chest was performed using the standard protocol during bolus administration of intravenous contrast. Multiplanar CT image reconstructions and MIPs were obtained to evaluate the vascular anatomy. RADIATION DOSE REDUCTION: This exam was performed according to the departmental dose-optimization program which includes automated exposure control, adjustment of the mA and/or kV according to patient size and/or use of iterative reconstruction technique.  CONTRAST:  27mL OMNIPAQUE IOHEXOL 350 MG/ML SOLN COMPARISON:  Chest x-ray 10/09/2022 FINDINGS: Cardiovascular: Satisfactory opacification of the pulmonary arteries to the segmental level. No evidence of pulmonary embolism. Nonaneurysmal aorta. No dissection is seen. Upper normal cardiac size. Trace pericardial effusion Mediastinum/Nodes: Midline trachea. No thyroid mass. Borderline right paratracheal nodes measuring up to 10 mm. Esophagus within normal limits. Lungs/Pleura: Heterogeneous bilateral ground-glass densities and patchy consolidations, worse in the right upper lobe, consistent with bilateral pneumonia. No pleural effusion or pneumothorax. Upper Abdomen: No acute abnormality. Musculoskeletal: Bilateral breast implants with mild capsular calcification. No acute osseous abnormality Review of the MIP images confirms the above findings. IMPRESSION: 1. Negative for acute pulmonary embolus or aortic dissection. 2. Heterogeneous bilateral ground-glass densities and patchy consolidations, worst in the right upper lobe, consistent with bilateral pneumonia Electronically Signed   By: Jasmine Pang M.D.   On: 10/09/2022 17:52   DG Chest Port 1 View  Result Date: 10/09/2022 CLINICAL DATA:  Shortness of breath. EXAM: PORTABLE CHEST 1 VIEW COMPARISON:  October 03, 2022. FINDINGS: Stable cardiomediastinal silhouette. Mild central pulmonary vascular congestion is noted with possible bilateral pulmonary edema. Bony thorax is unremarkable. IMPRESSION: Mild central pulmonary vascular congestion is noted with possible bilateral pulmonary edema. Electronically Signed   By: Lupita Raider M.D.   On: 10/09/2022 11:24    Results are pending, will review when available.  Assessment and Plan: * HCAP (healthcare-associated pneumonia) Patient presenting with 1 to 2-day history of worsening shortness of breath with multifocal pneumonia seen on CTA.  Given recent hospitalization for COVID-19 pneumonia, differential  includes HCAP, versus postviral pneumonia, versus progression of COVID-19 pneumonia. Per patient's family and EDP, wheezing noted; history of extensive tobacco use but no diagnosis of COPD.  No wheezing heard on examination.  - Continue supplemental oxygen to maintain oxygen saturation above 88% - Wean as tolerated - Duoneb every 6 hours - S/p Solu-Medrol 125 mg - Start cefepime - Continue vancomycin - MRSA PCR - Strep pneumo and Legionella urinary antigens - Procalcitonin - Consider continuing prednisone tomorrow pending procalcitonin results - Blood cultures pending  Diarrhea Patient's daughter notes history of diarrhea since contracting  COVID-19 and starting Paxlovid.  Most likely drug related, however given elevated WBC and recent hospitalization, will need to check for C. difficile  - C. difficile testing pending  Traumatic brain injury [facility]) Per patient's daughter, patient is usually able to answer questions appropriately and 1 or 2 words.  She notes that mentation does fluctuate at times though.  - Delirium precautions  Advance Care Planning:   Code Status: Full Code  Consults: None  Family Communication: Patient's daughter updated via telephone.  Severity of Illness: The appropriate patient status for this patient is OBSERVATION. Observation status is judged to be reasonable and necessary in order to provide the required intensity of service to ensure the patient's safety. The patient's presenting symptoms, physical exam findings, and initial radiographic and laboratory data in the context of their medical condition is felt to place them at decreased risk for further clinical deterioration. Furthermore, it is anticipated that the patient will be medically stable for discharge from the hospital within 2 midnights of admission.   Author: Verdene Lennert, MD 10/09/2022 7:18 PM  For on call review [email or web address].

## 2022-10-09 NOTE — Progress Notes (Signed)
PHARMACY -  BRIEF ANTIBIOTIC NOTE   Pharmacy has received consult(s) for Vancomycin from an ED provider.  The patient's profile has been reviewed for ht/wt/allergies/indication/available labs.    One time order(s) placed for Vancomycin 1750 mg IV  Further antibiotics/pharmacy consults should be ordered by admitting physician if indicated.                       Thank you, Foye Deer 10/09/2022  6:27 PM

## 2022-10-09 NOTE — Progress Notes (Signed)
Received IV consult for blood work. Patient has adequate IV access for IVF/IV medicine. Notified primary RN.

## 2022-10-09 NOTE — Progress Notes (Signed)
PHARMACIST - PHYSICIAN COMMUNICATION  CONCERNING:  Enoxaparin (Lovenox) for DVT Prophylaxis    RECOMMENDATION: Patient was prescribed enoxaprin 40mg  q24 hours for VTE prophylaxis.   Filed Weights   10/09/22 1019  Weight: 86 kg (189 lb 9.5 oz)    Body mass index is 31.55 kg/m.  Estimated Creatinine Clearance: 90.1 mL/min (by C-G formula based on SCr of 0.68 mg/dL).   Based on [facility] policy patient is candidate for enoxaparin 0.5mg /kg TBW SQ every 24 hours based on BMI being >30.   DESCRIPTION: Pharmacy has adjusted enoxaparin dose per [facility] policy.  Patient is now receiving enoxaparin 42.5 mg every 24 hours    [facility], PharmD Clinical Pharmacist  10/09/2022 6:59 PM

## 2022-10-09 NOTE — ED Notes (Signed)
Phlebotomy unsuccessful. Iv team consult placed. MD aware.

## 2022-10-09 NOTE — ED Notes (Signed)
Attempted labs x2 unsuccessful

## 2022-10-09 NOTE — Consult Note (Signed)
Pharmacy Antibiotic Note  Gloria Aguilar is a 51 y.o. female admitted on 10/09/2022 with pneumonia.  Pharmacy has been consulted for cefepime dosing.  **Patient has allergy to cephalexin but tolerated ceftriaxone on 10/03/22**  Plan: Start cefepime 2 grams IV every 8 hours.  Height: 5\' 5"  (165.1 cm) Weight: 86 kg (189 lb 9.5 oz) IBW/kg (Calculated) : 57  Temp (24hrs), Avg:98.4 F (36.9 C), Min:98.4 F (36.9 C), Max:98.4 F (36.9 C)  Recent Labs  Lab 10/03/22 0407 10/03/22 0601 10/04/22 0608 10/09/22 1123  WBC 9.3  --   --  20.1*  CREATININE 0.70  --  0.44 0.68  LATICACIDVEN 2.5* 1.1  --   --     Estimated Creatinine Clearance: 90.1 mL/min (by C-G formula based on SCr of 0.68 mg/dL).    Allergies  Allergen Reactions   Cephalexin Hives   Morphine    Wound Dressing Adhesive     Antimicrobials this admission: Cefepime 11/25 >>  One time does of levaquin and vancomycin ordered in ED but not given yet  Dose adjustments this admission: N/A  Microbiology results: 11/25 BCx: pending 11/25 MRSA PCR: pending  Thank you for allowing pharmacy to be a part of this patient's care.  12/25, PharmD 10/09/2022 7:22 PM

## 2022-10-09 NOTE — ED Provider Notes (Addendum)
Care assumed, patient with recent hospital admission with COVID.  History of TBI.  Diffuse wheezing on exam with worsening shortness of breath.  Received DuoNeb treatments.  Afebrile.  Tachypneic with signs of respiratory distress.  Obtaining CT angiography to evaluate for pulmonary embolism and a VBG to evaluate for hypercarbia.  Likely will need admission.  Leukocytosis.  CT scan consistent with bilateral pneumonia.  Given recent hospitalization will cover the patient for HCAP with cephalosporin allergy listed in her chart with IV Levaquin and vancomycin.  Consulted the hospitalist for admission.    Corena Herter, MD 10/09/22 1511    Corena Herter, MD 10/09/22 1811

## 2022-10-09 NOTE — ED Triage Notes (Signed)
See first nurse note. Pt noted to have increased RR, productive cough and wheezing.  Unable to thoroughly assess pt, pt repeatedly states "I need to got to go". Says "I dont know" to questions regarding name and other orientation questions.

## 2022-10-09 NOTE — ED Notes (Signed)
Pt passed swallow exam.

## 2022-10-09 NOTE — Assessment & Plan Note (Addendum)
Patient's daughter notes history of diarrhea since contracting COVID-19 and starting Paxlovid.  Most likely drug related, however given elevated WBC and recent hospitalization, will need to check for C. difficile  - C. difficile testing pending

## 2022-10-09 NOTE — ED Triage Notes (Signed)
Pt presents via EMS c/o increased SOb. Recently dx with COVID and d/c from hospital on Prednisone. Reports increasing SOB. Has hx of CVA with residual right sided deficit.

## 2022-10-09 NOTE — Assessment & Plan Note (Addendum)
Patient presenting with 1 to 2-day history of worsening shortness of breath with multifocal pneumonia seen on CTA.  Given recent hospitalization for COVID-19 pneumonia, differential includes HCAP, versus postviral pneumonia, versus progression of COVID-19 pneumonia. Per patient's family and EDP, wheezing noted; history of extensive tobacco use but no diagnosis of COPD.  No wheezing heard on examination.  - Continue supplemental oxygen to maintain oxygen saturation above 88%.  Currently on room air - Duoneb every 6 hours - S/p Solu-Medrol 125 mg while in the ED - Continue cefepime - Stopping vancomycin as MRSA PCR negative - Strep pneumo and Legionella urinary antigens pending - Procalcitonin negative - Blood cultures pending ID consult

## 2022-10-09 NOTE — ED Notes (Signed)
Called lab to collect labs.

## 2022-10-09 NOTE — Assessment & Plan Note (Addendum)
Per patient's daughter, patient is usually able to answer questions appropriately and 1 or 2 words.  She notes that mentation does fluctuate at times though.  - Delirium precautions MRI of the brain negative for acute pathology

## 2022-10-09 NOTE — ED Notes (Signed)
Per report received from previous shift RN, This patient did not have blood cultures drawn and IV does not draw blood.  Previous RN unable to obtain blood.  Phlebotomy unable to obtain blood.  IV team refused to place another IV due to facility policy stating "we do not place IV's to obtain blood." This RN was able to place IV access and draw one set of blood cultures.

## 2022-10-09 NOTE — ED Provider Notes (Signed)
[facility] Provider Note    Event Date/Time   First MD Initiated Contact with Patient 10/09/22 1350     (approximate)   History   Shortness of Breath  Level V caveat:  TBI poor historian  HPI  Kirk Sampley is a 51 y.o. female Cephus TBI with recent admission to hospital covid 19 sent to the ER for worsening shortness of breath fatigue.  Symptoms started over the past 24 to 36 hours.  Patient with very poor historian reporting "got to go" repetitively.     Physical Exam   Triage Vital Signs: ED Triage Vitals  Enc Vitals Group     BP 10/09/22 1018 (!) 142/108     Pulse Rate 10/09/22 1018 98     Resp 10/09/22 1018 (!) 22     Temp 10/09/22 1018 98.4 F (36.9 C)     Temp src --      SpO2 10/09/22 1018 97 %     Weight 10/09/22 1019 189 lb 9.5 oz (86 kg)     Height 10/09/22 1019 5\' 5"  (1.651 m)     Head Circumference --      Peak Flow --      Pain Score --      Pain Loc --      Pain Edu? --      Excl. in GC? --     Most recent vital signs: Vitals:   10/09/22 1018  BP: (!) 142/108  Pulse: 98  Resp: (!) 22  Temp: 98.4 F (36.9 C)  SpO2: 97%     Constitutional: Alert  Eyes: Conjunctivae are normal.  Head: Atraumatic. Nose: No congestion/rhinnorhea. Mouth/Throat: Mucous membranes are moist.   Neck: Painless ROM.  Cardiovascular:   Good peripheral circulation. Respiratory: Mild tachypnea with prolonged expiratory phase with diffuse wheezing throughout.   Gastrointestinal: Soft and nontender.  Musculoskeletal:  no deformity Neurologic:  No new no gross focal neurologic deficits are appreciated given limited exam 2/2 ams Skin:  Skin is warm, dry and intact. No rash noted. Psychiatric: anxious    ED Results / Procedures / Treatments   Labs (all labs ordered are listed, but only abnormal results are displayed) Labs Reviewed  CBC - Abnormal; Notable for the following components:      Result Value   WBC 20.1 (*)    All other  components within normal limits  COMPREHENSIVE METABOLIC PANEL - Abnormal; Notable for the following components:   BUN 22 (*)    Calcium 8.8 (*)    All other components within normal limits  BLOOD GAS, VENOUS - Abnormal; Notable for the following components:   pH, Ven 7.46 (*)    Bicarbonate 34.8 (*)    Acid-Base Excess 9.4 (*)    All other components within normal limits  BRAIN NATRIURETIC PEPTIDE     EKG  ED ECG REPORT I, 10/11/22, the attending physician, personally viewed and interpreted this ECG.   Date: 10/09/2022  EKG Time: 10:26  Rate: 80  Rhythm: sinus  Axis: normal  Intervals: normal qt  ST&T Change: no stemi    RADIOLOGY Please see ED Course for my review and interpretation.  I personally reviewed all radiographic images ordered to evaluate for the above acute complaints and reviewed radiology reports and findings.  These findings were personally discussed with the patient.  Please see medical record for radiology report.    PROCEDURES:  Critical Care performed: No  Procedures   MEDICATIONS ORDERED IN  ED: Medications  methylPREDNISolone sodium succinate (SOLU-MEDROL) 125 mg/2 mL injection 125 mg (has no administration in time range)  ipratropium-albuterol (DUONEB) 0.5-2.5 (3) MG/3ML nebulizer solution 3 mL (3 mLs Nebulization Given 10/09/22 1403)  ipratropium-albuterol (DUONEB) 0.5-2.5 (3) MG/3ML nebulizer solution 3 mL (3 mLs Nebulization Given 10/09/22 1403)     IMPRESSION / MDM / ASSESSMENT AND PLAN / ED COURSE  I reviewed the triage vital signs and the nursing notes.                              Differential diagnosis includes, but is not limited to, Asthma, copd, CHF, pna, ptx, malignancy, Pe, anemia  Patient presenting to the ER for evaluation of symptoms as described above.  Based on symptoms, risk factors and considered above differential, this presenting complaint could reflect a potentially life-threatening illness therefore the  patient will be placed on continuous pulse oximetry and telemetry for monitoring.  Laboratory evaluation will be sent to evaluate for the above complaints.  Chest x-ray on my review and interpretation without evidence of consolidation.  No pneumothorax.    Clinical Course as of 10/09/22 1517  Sat Oct 09, 2022  1516 Patient's presentation has improved after nebs and steroids.  Patient be signed out to oncoming physician pending follow-up CTA. [PR]    Clinical Course User Index [PR] Willy Eddy, MD      FINAL CLINICAL IMPRESSION(S) / ED DIAGNOSES   Final diagnoses:  Bronchitis  Dyspnea, unspecified type     Rx / DC Orders   ED Discharge Orders     None        Note:  This document was prepared using Dragon voice recognition software and may include unintentional dictation errors.    Willy Eddy, MD 10/09/22 1517

## 2022-10-10 ENCOUNTER — Inpatient Hospital Stay: Payer: Medicaid Other

## 2022-10-10 DIAGNOSIS — S069XAS Unspecified intracranial injury with loss of consciousness status unknown, sequela: Secondary | ICD-10-CM | POA: Diagnosis not present

## 2022-10-10 DIAGNOSIS — Z79899 Other long term (current) drug therapy: Secondary | ICD-10-CM | POA: Diagnosis not present

## 2022-10-10 DIAGNOSIS — Z888 Allergy status to other drugs, medicaments and biological substances status: Secondary | ICD-10-CM | POA: Diagnosis not present

## 2022-10-10 DIAGNOSIS — R0600 Dyspnea, unspecified: Secondary | ICD-10-CM | POA: Diagnosis not present

## 2022-10-10 DIAGNOSIS — F419 Anxiety disorder, unspecified: Secondary | ICD-10-CM | POA: Diagnosis present

## 2022-10-10 DIAGNOSIS — F17210 Nicotine dependence, cigarettes, uncomplicated: Secondary | ICD-10-CM | POA: Diagnosis present

## 2022-10-10 DIAGNOSIS — J22 Unspecified acute lower respiratory infection: Secondary | ICD-10-CM | POA: Diagnosis not present

## 2022-10-10 DIAGNOSIS — R4701 Aphasia: Secondary | ICD-10-CM | POA: Diagnosis present

## 2022-10-10 DIAGNOSIS — Z8673 Personal history of transient ischemic attack (TIA), and cerebral infarction without residual deficits: Secondary | ICD-10-CM | POA: Diagnosis not present

## 2022-10-10 DIAGNOSIS — Z7982 Long term (current) use of aspirin: Secondary | ICD-10-CM | POA: Diagnosis not present

## 2022-10-10 DIAGNOSIS — E669 Obesity, unspecified: Secondary | ICD-10-CM | POA: Diagnosis present

## 2022-10-10 DIAGNOSIS — K5900 Constipation, unspecified: Secondary | ICD-10-CM | POA: Diagnosis not present

## 2022-10-10 DIAGNOSIS — Z7409 Other reduced mobility: Secondary | ICD-10-CM | POA: Diagnosis not present

## 2022-10-10 DIAGNOSIS — K5903 Drug induced constipation: Secondary | ICD-10-CM | POA: Diagnosis present

## 2022-10-10 DIAGNOSIS — Z79891 Long term (current) use of opiate analgesic: Secondary | ICD-10-CM | POA: Diagnosis not present

## 2022-10-10 DIAGNOSIS — S098XXA Other specified injuries of head, initial encounter: Secondary | ICD-10-CM | POA: Diagnosis not present

## 2022-10-10 DIAGNOSIS — J40 Bronchitis, not specified as acute or chronic: Secondary | ICD-10-CM | POA: Diagnosis present

## 2022-10-10 DIAGNOSIS — R197 Diarrhea, unspecified: Secondary | ICD-10-CM

## 2022-10-10 DIAGNOSIS — M25562 Pain in left knee: Secondary | ICD-10-CM | POA: Diagnosis present

## 2022-10-10 DIAGNOSIS — M7918 Myalgia, other site: Secondary | ICD-10-CM | POA: Diagnosis present

## 2022-10-10 DIAGNOSIS — J9811 Atelectasis: Secondary | ICD-10-CM | POA: Diagnosis not present

## 2022-10-10 DIAGNOSIS — T402X5A Adverse effect of other opioids, initial encounter: Secondary | ICD-10-CM | POA: Diagnosis present

## 2022-10-10 DIAGNOSIS — S069X9S Unspecified intracranial injury with loss of consciousness of unspecified duration, sequela: Secondary | ICD-10-CM

## 2022-10-10 DIAGNOSIS — Y95 Nosocomial condition: Secondary | ICD-10-CM | POA: Diagnosis present

## 2022-10-10 DIAGNOSIS — Z515 Encounter for palliative care: Secondary | ICD-10-CM | POA: Diagnosis not present

## 2022-10-10 DIAGNOSIS — R1084 Generalized abdominal pain: Secondary | ICD-10-CM | POA: Diagnosis not present

## 2022-10-10 DIAGNOSIS — J189 Pneumonia, unspecified organism: Principal | ICD-10-CM

## 2022-10-10 DIAGNOSIS — F32A Depression, unspecified: Secondary | ICD-10-CM | POA: Diagnosis present

## 2022-10-10 DIAGNOSIS — Z9882 Breast implant status: Secondary | ICD-10-CM | POA: Diagnosis not present

## 2022-10-10 DIAGNOSIS — Z6832 Body mass index (BMI) 32.0-32.9, adult: Secondary | ICD-10-CM | POA: Diagnosis not present

## 2022-10-10 DIAGNOSIS — J129 Viral pneumonia, unspecified: Secondary | ICD-10-CM

## 2022-10-10 DIAGNOSIS — R000 Tachycardia, unspecified: Secondary | ICD-10-CM | POA: Diagnosis present

## 2022-10-10 DIAGNOSIS — Z885 Allergy status to narcotic agent status: Secondary | ICD-10-CM | POA: Diagnosis not present

## 2022-10-10 DIAGNOSIS — U071 COVID-19: Secondary | ICD-10-CM | POA: Diagnosis not present

## 2022-10-10 LAB — BASIC METABOLIC PANEL
Anion gap: 7 (ref 5–15)
BUN: 17 mg/dL (ref 6–20)
CO2: 26 mmol/L (ref 22–32)
Calcium: 8.3 mg/dL — ABNORMAL LOW (ref 8.9–10.3)
Chloride: 106 mmol/L (ref 98–111)
Creatinine, Ser: 0.53 mg/dL (ref 0.44–1.00)
GFR, Estimated: >60 mL/min (ref >60)
Glucose, Bld: 140 mg/dL — ABNORMAL HIGH (ref 70–99)
Potassium: 4 mmol/L (ref 3.5–5.1)
Sodium: 139 mmol/L (ref 135–145)

## 2022-10-10 LAB — URINALYSIS, ROUTINE W REFLEX MICROSCOPIC
Bilirubin Urine: NEGATIVE
Glucose, UA: NEGATIVE mg/dL
Hgb urine dipstick: NEGATIVE
Ketones, ur: NEGATIVE mg/dL
Leukocytes,Ua: NEGATIVE
Nitrite: NEGATIVE
Protein, ur: NEGATIVE mg/dL
Specific Gravity, Urine: 1.024 (ref 1.005–1.030)
pH: 7 (ref 5.0–8.0)

## 2022-10-10 LAB — CBC
HCT: 33.8 % — ABNORMAL LOW (ref 36.0–46.0)
Hemoglobin: 12.1 g/dL (ref 12.0–15.0)
MCH: 30.9 pg (ref 26.0–34.0)
MCHC: 35.8 g/dL (ref 30.0–36.0)
MCV: 86.4 fL (ref 80.0–100.0)
Platelets: 272 10*3/uL (ref 150–400)
RBC: 3.91 MIL/uL (ref 3.87–5.11)
RDW: 13.2 % (ref 11.5–15.5)
WBC: 14.9 10*3/uL — ABNORMAL HIGH (ref 4.0–10.5)
nRBC: 0 % (ref 0.0–0.2)

## 2022-10-10 LAB — C-REACTIVE PROTEIN
CRP: 6.2 mg/dL — ABNORMAL HIGH (ref <1.0)
CRP: 7.7 mg/dL — ABNORMAL HIGH (ref <1.0)

## 2022-10-10 LAB — LACTIC ACID, PLASMA
Lactic Acid, Venous: 2.1 mmol/L (ref 0.5–1.9)
Lactic Acid, Venous: 1.6 mmol/L (ref 0.5–1.9)
Lactic Acid, Venous: 3.2 mmol/L (ref 0.5–1.9)
Lactic Acid, Venous: 1.5 mmol/L (ref 0.5–1.9)
Lactic Acid, Venous: 2 mmol/L (ref 0.5–1.9)

## 2022-10-10 LAB — PROCALCITONIN: Procalcitonin: <0.1 ng/mL

## 2022-10-10 LAB — STREP PNEUMONIAE URINARY ANTIGEN: Strep Pneumo Urinary Antigen: NEGATIVE

## 2022-10-10 MED ORDER — IPRATROPIUM-ALBUTEROL 20-100 MCG/ACT IN AERS
2.0000 | INHALATION_SPRAY | Freq: Four times a day (QID) | RESPIRATORY_TRACT | Status: DC
  Administered 2022-10-10: 2 via RESPIRATORY_TRACT
  Filled 2022-10-10: qty 4

## 2022-10-10 MED ORDER — LACTATED RINGERS IV SOLN: INTRAVENOUS | Status: DC

## 2022-10-10 NOTE — Hospital Course (Addendum)
51 y.o. 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  Was having left knee pain so x-ray was done which did not show any acute pathology.  Off antibiotics 11/28: Can go back to [facility] after COVID isolation completed on 11/30 per TOC.  Patient medically stable, palliative care consult  11/30: off Covid isolation.  Discharge cancelled due to abdominal pain. Xray showed extensive stool burden throughout colon.  Bowel regimen increased with suppository and enema ordered.  12/1: HR"s increased overnight and remained elevated.  Ruled out DVT/PE.  Chest xray with ?infection vs atelectasis.  Pt requiring oxygen again.  Discharge again deferred and started on empiric antibiotics. Resumed cardiac monitoring, remains sinus rhythm.  12/2: enema given with large BM result. No further abdominal pain. Off oxygen.  HR mildly elevated but improved and likely due to pain.  Patient was planned for discharge to SNF/rehab, but then developed recurrent severe generalized abdominal pain.  Further evaluation including imaging and consults to surgery and GI.  Added Movantik for opioid-induced constipation.   12/4: pt reports abdominal pain somewhat improved. Tolerating meals well.  HR's have also improved, consistent with improvement in pain.  Patient is medically stable and requesting for discharge today.

## 2022-10-10 NOTE — Plan of Care (Signed)

## 2022-10-10 NOTE — Progress Notes (Signed)
       CROSS COVER NOTE  NAME: Normalee Sistare MRN: [id number] DOB : Nov 01, 1971    Time of Service   [phone]  HPI/Events of Note   Lactic acid now 2.1  Assessment and  Interventions   Assessment:  Plan: Start LR at 125 ml/h Repeat lactate at noon X      Donnie Mesa NP [facility]

## 2022-10-10 NOTE — Progress Notes (Signed)
Pt arrived to room 260 via bed from 1A. Received report from Jonny Ruiz, Charity fundraiser. See reassessment. Will continue to monitor.

## 2022-10-10 NOTE — Progress Notes (Signed)
  Progress Note   Patient: Gloria Aguilar NGE:[id number] DOB: 11-Sep-1971 DOA: 10/09/2022     0 DOS: the patient was seen and examined on 10/10/2022   Brief hospital course: 51 y.o. 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  Given recent hospitalization for COVID-19 pneumonia, differential includes HCAP, versus postviral pneumonia, versus progression of COVID-19 pneumonia. Per patient's family and EDP, wheezing noted; history of extensive tobacco use but no diagnosis of COPD.  No wheezing heard on examination.  - Continue supplemental oxygen to maintain oxygen saturation above 88%.  Currently on room air - Duoneb every 6 hours - S/p Solu-Medrol 125 mg while in the ED - Continue cefepime - Stopping vancomycin as MRSA PCR negative - Strep pneumo and Legionella urinary antigens pending - Procalcitonin negative - Blood cultures pending ID consult  Diarrhea Patient's daughter notes history of diarrhea since contracting COVID-19 and starting Paxlovid.  Most likely drug related  - C. difficile negative  Traumatic brain injury [facility]) Per patient's daughter, patient is usually able to answer questions appropriately and 1 or 2 words.  She notes that mentation does fluctuate at times though.  - Delirium precautions MRI of the brain negative for acute pathology        Subjective: Unable to provide clear history or complaints.  She remains confused.  According to daughter patient was quite short of breath at home and she decided to bring her here  Physical Exam: Vitals:    10/10/22 0433 10/10/22 0901 10/10/22 1522 10/10/22 1600  BP: 112/75 107/61 110/72 121/78  Pulse: 79 89 77 88  Resp: 18 17 18 17   Temp: 97.6 F (36.4 C) 99.2 F (37.3 C) 98.5 F (36.9 C) 98.2 F (36.8 C)  TempSrc:   Oral Oral  SpO2: 96% 93% 97% 96%  Weight:      Height:       51 year old female lying in the bed comfortably in no acute distress Lungs bilateral rhonchi at the bases.  No wheezing or rales Cardiovascular regular rate and rhythm Abdomen soft, benign Skin no rash or lesion Psych: Impaired cognition Neuro alert and awake.  She is completely disoriented and keeps repeating same words it does not make much sense.  She does have residual right-sided weakness from previous stroke Data Reviewed:  Lactic acid 3.2  Family Communication: Daughter updated over phone.  She is requesting placement to SNF  Disposition: Status is: Inpatient Remains inpatient appropriate because: Pneumonia/lactic acidosis management  Planned Discharge Destination: Skilled nursing facility   DVT prophylaxis-Lovenox Time spent: 35 minutes  Author: 44, MD 10/10/2022 5:17 PM  For on call review [email or web address]/[email or web address].
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
## 2022-10-10 NOTE — Progress Notes (Signed)
SLP Cancellation Note  Patient Details Name: Gloria Aguilar MRN: [id number] DOB: [birth year]   Cancelled treatment:       Reason Eval/Treat Not Completed: Medical issues which prohibited therapy;Patient at procedure or test/unavailable   SLP consult received and appreciated. Chart review completed. Pt currently OTF for MRI. Will continue efforts as appropriate.   Clyde Canterbury, M.S., CCC-SLP Speech-Language Pathologist [facility] [phone] Arnette Felts)  Woodroe Chen 10/10/2022, 11:39 AM

## 2022-10-11 ENCOUNTER — Inpatient Hospital Stay: Payer: Medicaid Other

## 2022-10-11 DIAGNOSIS — J189 Pneumonia, unspecified organism: Secondary | ICD-10-CM | POA: Diagnosis not present

## 2022-10-11 DIAGNOSIS — J22 Unspecified acute lower respiratory infection: Secondary | ICD-10-CM

## 2022-10-11 DIAGNOSIS — R197 Diarrhea, unspecified: Secondary | ICD-10-CM | POA: Diagnosis not present

## 2022-10-11 DIAGNOSIS — S069X9S Unspecified intracranial injury with loss of consciousness of unspecified duration, sequela: Secondary | ICD-10-CM | POA: Diagnosis not present

## 2022-10-11 DIAGNOSIS — U071 COVID-19: Secondary | ICD-10-CM

## 2022-10-11 LAB — COMPREHENSIVE METABOLIC PANEL
ALT: 19 U/L (ref 0–44)
AST: 17 U/L (ref 15–41)
Albumin: 2.9 g/dL — ABNORMAL LOW (ref 3.5–5.0)
Alkaline Phosphatase: 89 U/L (ref 38–126)
Anion gap: 7 (ref 5–15)
BUN: 28 mg/dL — ABNORMAL HIGH (ref 6–20)
CO2: 25 mmol/L (ref 22–32)
Calcium: 8.4 mg/dL — ABNORMAL LOW (ref 8.9–10.3)
Chloride: 106 mmol/L (ref 98–111)
Creatinine, Ser: 0.62 mg/dL (ref 0.44–1.00)
GFR, Estimated: >60 mL/min (ref >60)
Glucose, Bld: 91 mg/dL (ref 70–99)
Potassium: 4.2 mmol/L (ref 3.5–5.1)
Sodium: 138 mmol/L (ref 135–145)
Total Bilirubin: 0.6 mg/dL (ref 0.3–1.2)
Total Protein: 6.3 g/dL — ABNORMAL LOW (ref 6.5–8.1)

## 2022-10-11 LAB — RESPIRATORY PANEL BY PCR

## 2022-10-11 LAB — CBC
HCT: 31.9 % — ABNORMAL LOW (ref 36.0–46.0)
Hemoglobin: 11.4 g/dL — ABNORMAL LOW (ref 12.0–15.0)
MCH: 31.1 pg (ref 26.0–34.0)
MCHC: 35.7 g/dL (ref 30.0–36.0)
MCV: 87.2 fL (ref 80.0–100.0)
Platelets: 258 10*3/uL (ref 150–400)
RBC: 3.66 MIL/uL — ABNORMAL LOW (ref 3.87–5.11)
RDW: 13.6 % (ref 11.5–15.5)
WBC: 9.2 10*3/uL (ref 4.0–10.5)
nRBC: 0 % (ref 0.0–0.2)

## 2022-10-11 LAB — LEGIONELLA PNEUMOPHILA SEROGP 1 UR AG: L. pneumophila Serogp 1 Ur Ag: NEGATIVE

## 2022-10-11 MED ORDER — ENOXAPARIN SODIUM 40 MG/0.4ML IJ SOSY
40.0000 mg | PREFILLED_SYRINGE | INTRAMUSCULAR | Status: DC
  Administered 2022-10-11 – 2022-10-17 (×7): 40 mg via SUBCUTANEOUS
  Filled 2022-10-11 (×7): qty 0.4

## 2022-10-11 MED ORDER — HYDROMORPHONE HCL 1 MG/ML IJ SOLN
1.0000 mg | Freq: Once | INTRAMUSCULAR | Status: AC
  Administered 2022-10-11: 1 mg via INTRAVENOUS
  Filled 2022-10-11: qty 1

## 2022-10-11 MED ORDER — ALBUTEROL SULFATE (2.5 MG/3ML) 0.083% IN NEBU: 2.5000 mg | INHALATION_SOLUTION | Freq: Four times a day (QID) | RESPIRATORY_TRACT | Status: DC

## 2022-10-11 MED ORDER — IPRATROPIUM-ALBUTEROL 0.5-2.5 (3) MG/3ML IN SOLN: 3.0000 mL | Freq: Four times a day (QID) | RESPIRATORY_TRACT | Status: DC
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
## 2022-10-11 NOTE — Progress Notes (Signed)
CSW called and spoke with Rickey @ [facility] to inform him that pt will finish Covid isolation on 11/30.  Rickey states that he will check to make sure that her LTC Medicaid is still active.  He will give me a call back once he gets the information.  CSW awaiting call back from [place].  [address] [place], LCSW [phone]

## 2022-10-11 NOTE — Evaluation (Addendum)
Clinical/Bedside Swallow Evaluation Patient Details  Name: Gloria Aguilar MRN: [id number] Date of Birth: Jul 14, 1971  Today's Date: 10/11/2022 Time: SLP Start Time (ACUTE ONLY): 0800 SLP Stop Time (ACUTE ONLY): 0820 SLP Time Calculation (min) (ACUTE ONLY): 20 min  Past Medical History:  Past Medical History:  Diagnosis Date   Anxiety    Depression    Stroke [facility])    Past Surgical History:  Past Surgical History:  Procedure Laterality Date   COSMETIC SURGERY     EXTERNAL FIXATION LEG     HPI:  Per H&P "Gloria Aguilar is a 51 y.o. 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  History limited due to patient's baseline altered mental status in the setting of TBI.  History obtained from chart review and from patient's daughter Ms. [name].     Per chart review, patient has been experiencing worsening shortness of breath over the past 24 to 36 hours.  I discussed with patient's daughter Ms. Sharon Seller who states that patient's breathing never returned back to baseline, but it has particularly worsened in the last day or 2.  She has been coughing more often.  In addition, patient has been experiencing diarrhea since being discharged from the hospital.     When I asked Ms. Head how she is feeling, she repeats "bad."  She cannot articulate what is particularly bothering her but will state that and then point to her abdomen, her chest and her face.  She repeats the statement "got to go".     Per chart review, patient was recently admitted from 11/19 to 11/24 for acute hypoxic respiratory failure secondary to COVID-19.  She was discharged on room air with plans to complete Paxlovid course and 5 days of prednisone."  CT Angio 10/09/22 "1. Negative for acute pulmonary embolus or aortic dissection. 2. Heterogeneous bilateral ground-glass densities and  patchy consolidations, worst in the right upper lobe, consistent with bilateral pneumonia"  MRI Brain 10/10/22 "IMPRESSION: 1. No acute intracranial pathology. 2. Unchanged large remote left MCA territorial infarct and smaller remote infarcts in the right cerebral hemisphere."   Assessment / Plan / Recommendation  Clinical Impression  Pt seen for clinical swallowing evaluation. Pt alert and cooperative. Notable premorbid aphasia. Perseverative verbal output noted, "I need to go..."  Pt able to follow 1-step commands and answer yes/no questions consistently during evaluation. Pt on room air.   Per chart review, pt known to SLP services for aphasia in 2021-2022 and most recently, for clinical swallowing evaluation on 10/04/22. Most recent SLP recommendations were for a regular diet with thin liquids with safe swallowing strategies/aspiration precautions.   Pt given trials of solid, pureed, and thin liquids (via cup/straw). Pt able to feed self with set up due to flaccid RUE. Pt demonstrated a functional oral swallow c/b mildly prolonged mastication of solids which is likely baseline and secondary to edentulism. Pharyngeal swallow appeared [facility] per clinical assessment. To palpation, pt with seemingly timely swallow initiation and seemingly adequate laryngeal elevation. No overt s/sx pharyngeal dysphagia. Vocal quality remained strong/clear across trials.  Recommend continuation of a regular diet with thin liquids and safe swallowing strategies/aspiration precautions as outlined below. Recommend well cut, moistened solids.   SLP to sign off as pt likely at swallowing baseline.   Pt and RN made aware of results, recommendations, and SLP POC. Pt verbalized understanding/agreement. Reinforcement of content may  be needed.   SLP Visit Diagnosis: Dysphagia, oral phase (R13.11)    Aspiration Risk  Mild aspiration risk    Diet Recommendation Regular;Thin liquid (well cut/moistened solids)    Medication Administration:  (as tolerated) Supervision: Patient able to self feed;Staff to assist with self feeding;Full supervision/cueing for compensatory strategies (set up and assistance PRN) Compensations: Slow rate;Small sips/bites Postural Changes: Seated upright at 90 degrees;Remain upright for at least 30 minutes after po intake    Other  Recommendations Oral Care Recommendations: Oral care BID;Oral care before and after PO;Staff/trained caregiver to provide oral care    Recommendations for follow up therapy are one component of a multi-disciplinary discharge planning process, led by the attending physician.  Recommendations may be updated based on patient status, additional functional criteria and insurance authorization.  Follow up Recommendations No SLP follow up         Functional Status Assessment Patient has had a recent decline in their functional status and demonstrates the ability to make significant improvements in function in a reasonable and predictable amount of time.         Prognosis Prognosis for Safe Diet Advancement: Good Barriers to Reach Goals: Time post onset;Severity of deficits      Swallow Study   General Date of Onset: 10/09/22 HPI: Per H&P "Gloria Aguilar is a 51 y.o. 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  History limited due to patient's baseline altered mental status in the setting of TBI.  History obtained from chart review and from patient's daughter Ms. [name].     Per chart review, patient has been experiencing worsening shortness of breath over the past 24 to 36 hours.  I discussed with patient's daughter Ms. Sharon Seller who states that patient's breathing never returned back to baseline, but it has particularly worsened in the last day or 2.  She has been coughing more often.  In addition,  patient has been experiencing diarrhea since being discharged from the hospital.     When I asked Ms. Jurgens how she is feeling, she repeats "bad."  She cannot articulate what is particularly bothering her but will state that and then point to her abdomen, her chest and her face.  She repeats the statement "got to go".     Per chart review, patient was recently admitted from 11/19 to 11/24 for acute hypoxic respiratory failure secondary to COVID-19.  She was discharged on room air with plans to complete Paxlovid course and 5 days of prednisone." Type of Study: Bedside Swallow Evaluation Previous Swallow Assessment: clinical assessment evaluation 10/03/21 Diet Prior to this Study: Regular;Thin liquids Temperature Spikes Noted: No Respiratory Status: Room air Behavior/Cognition: Alert;Cooperative;Pleasant mood;Requires cueing Oral Cavity Assessment: Within Functional Limits Oral Care Completed by SLP: Yes Oral Cavity - Dentition: Edentulous Vision: Functional for self-feeding Self-Feeding Abilities: Able to feed self;Needs assist;Needs set up (flaccid RUE) Patient Positioning: Upright in bed Baseline Vocal Quality: Normal Volitional Cough: Strong Volitional Swallow: Able to elicit    Oral/Motor/Sensory Function Overall Oral Motor/Sensory Function: Within functional limits   Ice Chips Ice chips: Not tested   Thin Liquid Thin Liquid: Within functional limits Presentation: Cup;Straw;Self Fed Other Comments: ~4 oz; set up    Nectar Thick Nectar Thick Liquid: Not tested   Honey Thick Honey Thick Liquid: Not tested   Puree Puree: Within functional limits Presentation: Self Fed;Spoon Other Comments: ~  2 oz; set up   Solid     Solid: Impaired Oral Phase Impairments: Impaired mastication Oral Phase Functional Implications: Impaired mastication Other Comments: mildly prolonged, but functional, mastication     Clyde Canterbury, M.S., CCC-SLP Speech-Language Pathologist  [facility] [phone] (ASCOM)   Alessandra Bevels Georgios Kina 10/11/2022,9:30 AM

## 2022-10-11 NOTE — Progress Notes (Signed)
  Progress Note   Patient: Gloria Aguilar ZOX:[id number] DOB: Sep 06, 1971 DOA: 10/09/2022     1 DOS: the patient was seen and examined on 10/11/2022   Brief hospital course: 51 y.o. 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  Was having left knee pain so x-ray was done which did not show any acute pathology.  Off antibiotics  Assessment and Plan: * Multifocal pneumonia Patient presenting with 1 to 2-day history of worsening shortness of breath with multifocal pneumonia seen on CTA.  Given recent hospitalization for COVID-19 pneumonia,  Per patient's family and EDP, wheezing noted; history of extensive tobacco use but no diagnosis of COPD.  No wheezing heard on examination.  - Per discussion with ID consult likely improving covid pneumonia.  As patient is on room air and at her baseline no further treatment needed.  Stopped antibiotics  Diarrhea Patient's daughter notes history of diarrhea since contracting COVID-19 and starting Paxlovid.  Most likely drug related.  Now resolved  - C. difficile negative  Traumatic brain injury [facility]) Per patient's daughter, patient is usually able to answer questions appropriately and 1 or 2 words.  She notes that mentation does fluctuate at times though.  - Delirium precautions MRI of the brain negative for acute pathology        Subjective: Left knee pain reported to nursing likely from her DJD  Physical Exam: Vitals:   10/11/22 1216 10/11/22 1408 10/11/22 1506 10/11/22 2127  BP: 121/74 116/80 112/63 100/68  Pulse: 94 90 93 78  Resp: 18 16 20 17   Temp: 97.8 F (36.6 C) 98.1 F (36.7 C) 97.8 F (36.6 C) 97.7 F (36.5 C)  TempSrc:    Oral  SpO2: 95% 99% 98%  97%  Weight:      Height:       51 year old female lying in the bed comfortably in no acute distress Lungs bilateral rhonchi at the bases.  No wheezing or rales Cardiovascular regular rate and rhythm Abdomen soft, benign Skin no rash or lesion Psych: Impaired cognition Neuro alert and awake.  She is disoriented to place, oriented to person. She does have residual right-sided weakness from previous stroke Data Reviewed:  Left knee x-ray shows severe DJD  Family Communication: None  Disposition: Status is: Inpatient Remains inpatient appropriate because: Medically stable waiting for placement  Planned Discharge Destination: Skilled nursing facility   DVT prophylaxis-Lovenox Time spent: 35 minutes  Author: 44, MD 10/11/2022 10:30 PM  For on call review [email or web address].

## 2022-10-11 NOTE — Assessment & Plan Note (Signed)
Patient presenting with 1 to 2-day history of worsening shortness of breath with multifocal pneumonia seen on CTA.  Given recent hospitalization for COVID-19 pneumonia,  Per patient's family and EDP, wheezing noted; history of extensive tobacco use but no diagnosis of COPD.  No wheezing heard on examination.  - Per discussion with ID consult likely improving covid pneumonia.  As patient is on room air and at her baseline no further treatment needed.  Stopped antibiotics. 

## 2022-10-11 NOTE — Assessment & Plan Note (Signed)
Patient's daughter notes history of diarrhea since contracting COVID-19 and starting Paxlovid.  Most likely drug related.  Now resolved  - C. difficile negative

## 2022-10-11 NOTE — Assessment & Plan Note (Signed)
Per patient's daughter, patient is usually able to answer questions appropriately and 1 or 2 words.  She notes that mentation does fluctuate at times though.  - Delirium precautions MRI of the brain negative for acute pathology

## 2022-10-12 ENCOUNTER — Other Ambulatory Visit: Payer: Self-pay

## 2022-10-12 DIAGNOSIS — U071 COVID-19: Secondary | ICD-10-CM | POA: Diagnosis not present

## 2022-10-12 DIAGNOSIS — J189 Pneumonia, unspecified organism: Secondary | ICD-10-CM | POA: Diagnosis not present

## 2022-10-12 DIAGNOSIS — S069X9S Unspecified intracranial injury with loss of consciousness of unspecified duration, sequela: Secondary | ICD-10-CM | POA: Diagnosis not present

## 2022-10-12 DIAGNOSIS — R197 Diarrhea, unspecified: Secondary | ICD-10-CM | POA: Diagnosis not present

## 2022-10-12 MED ORDER — OXYCODONE-ACETAMINOPHEN 5-325 MG PO TABS
1.0000 | ORAL_TABLET | ORAL | Status: DC | PRN
  Administered 2022-10-12 – 2022-10-17 (×17): 1 via ORAL
  Filled 2022-10-12 (×18): qty 1

## 2022-10-12 MED ORDER — HYDROMORPHONE HCL 1 MG/ML IJ SOLN
1.0000 mg | Freq: Once | INTRAMUSCULAR | Status: AC
  Administered 2022-10-12: 1 mg via INTRAVENOUS
  Filled 2022-10-12: qty 1

## 2022-10-12 MED ORDER — IPRATROPIUM-ALBUTEROL 20-100 MCG/ACT IN AERS
2.0000 | INHALATION_SPRAY | Freq: Four times a day (QID) | RESPIRATORY_TRACT | Status: DC
  Administered 2022-10-12 – 2022-10-18 (×23): 2 via RESPIRATORY_TRACT
  Filled 2022-10-12: qty 4

## 2022-10-12 NOTE — Assessment & Plan Note (Signed)
Patient's daughter notes history of diarrhea since contracting COVID-19 and starting Paxlovid.  Most likely drug related.  Now resolved  - C. difficile negative 

## 2022-10-12 NOTE — Assessment & Plan Note (Signed)
Patient presenting with 1 to 2-day history of worsening shortness of breath with multifocal pneumonia seen on CTA.  Given recent hospitalization for COVID-19 pneumonia,  Per patient's family and EDP, wheezing noted; history of extensive tobacco use but no diagnosis of COPD.  No wheezing heard on examination.  - Per discussion with ID consult likely improving covid pneumonia.  As patient is on room air and at her baseline no further treatment needed.  Stopped antibiotics.

## 2022-10-12 NOTE — TOC Progression Note (Signed)
Transition of Care [facility]) - Progression Note    Patient Details  Name: Gloria Aguilar MRN: [id number] Date of Birth: 12-Apr-1971  Transition of Care [facility]) CM/SW Contact  Tempie Hoist, [place] Phone Number: 10/12/2022, 4:38 PM  Clinical Narrative:      TOC spoke with Cain Saupe, the patient's daughter [phone]. TOC informed the daughter that a referral has been sent to [facility]. TOC will ask Clide Cliff for an update on the patient's LTC Medicaid when he is back in the office tomorrow.   Expected Discharge Plan and Services      SNF                                           Social Determinants of Health (SDOH) Interventions    Readmission Risk Interventions     No data to display

## 2022-10-12 NOTE — Assessment & Plan Note (Addendum)
Per patient's daughter, patient is usually able to answer questions appropriately and 1 or 2 words.  She notes that mentation does fluctuate at times though. - Delirium precautions MRI of the brain negative for acute pathology.  Patient seems to be at baseline mental status now
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
## 2022-10-13 DIAGNOSIS — R0600 Dyspnea, unspecified: Secondary | ICD-10-CM | POA: Diagnosis not present

## 2022-10-13 DIAGNOSIS — J189 Pneumonia, unspecified organism: Secondary | ICD-10-CM | POA: Diagnosis not present

## 2022-10-13 DIAGNOSIS — Z515 Encounter for palliative care: Secondary | ICD-10-CM

## 2022-10-13 DIAGNOSIS — J40 Bronchitis, not specified as acute or chronic: Principal | ICD-10-CM | POA: Diagnosis present

## 2022-10-13 DIAGNOSIS — R197 Diarrhea, unspecified: Secondary | ICD-10-CM | POA: Diagnosis not present

## 2022-10-13 DIAGNOSIS — Z789 Other specified health status: Secondary | ICD-10-CM

## 2022-10-13 DIAGNOSIS — U071 COVID-19: Secondary | ICD-10-CM | POA: Diagnosis not present

## 2022-10-13 DIAGNOSIS — S069X9S Unspecified intracranial injury with loss of consciousness of unspecified duration, sequela: Secondary | ICD-10-CM | POA: Diagnosis not present

## 2022-10-13 HISTORY — DX: Dyspnea, unspecified: R06.00

## 2022-10-13 MED ORDER — SENNOSIDES-DOCUSATE SODIUM 8.6-50 MG PO TABS
1.0000 | ORAL_TABLET | Freq: Two times a day (BID) | ORAL | Status: DC
  Administered 2022-10-13 – 2022-10-18 (×11): 1 via ORAL
  Filled 2022-10-13 (×11): qty 1
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
## 2022-10-13 NOTE — TOC Progression Note (Signed)
Transition of Care [facility]) - Progression Note    Patient Details  Name: Gloria Aguilar MRN: [id number] Date of Birth: May 28, 1971  Transition of Care [facility]) CM/SW Contact  Tempie Hoist, [place] Phone Number: 10/13/2022, 1:00 PM  Clinical Narrative:      TOC left a message for Sandrea Matte at [facility]. Patient will need SNF at discharge.      Expected Discharge Plan and Services      [facility]                                           Social Determinants of Health (SDOH) Interventions    Readmission Risk Interventions     No data to display

## 2022-10-14 ENCOUNTER — Encounter: Payer: Self-pay | Admitting: Internal Medicine

## 2022-10-14 ENCOUNTER — Inpatient Hospital Stay: Payer: Medicaid Other

## 2022-10-14 DIAGNOSIS — Z978 Presence of other specified devices: Secondary | ICD-10-CM

## 2022-10-14 DIAGNOSIS — Z515 Encounter for palliative care: Secondary | ICD-10-CM | POA: Diagnosis not present

## 2022-10-14 DIAGNOSIS — U071 COVID-19: Secondary | ICD-10-CM | POA: Diagnosis not present

## 2022-10-14 DIAGNOSIS — S069X9S Unspecified intracranial injury with loss of consciousness of unspecified duration, sequela: Secondary | ICD-10-CM | POA: Diagnosis not present

## 2022-10-14 DIAGNOSIS — J189 Pneumonia, unspecified organism: Secondary | ICD-10-CM | POA: Diagnosis not present

## 2022-10-14 DIAGNOSIS — K5900 Constipation, unspecified: Secondary | ICD-10-CM | POA: Clinically undetermined

## 2022-10-14 LAB — CULTURE, BLOOD (ROUTINE X 2): Culture: NO GROWTH

## 2022-10-14 MED ORDER — METHOCARBAMOL 750 MG PO TABS: 750.0000 mg | ORAL_TABLET | Freq: Three times a day (TID) | ORAL | Status: DC | PRN

## 2022-10-14 MED ORDER — METHOCARBAMOL 500 MG PO TABS
750.0000 mg | ORAL_TABLET | Freq: Three times a day (TID) | ORAL | Status: DC
  Administered 2022-10-14 – 2022-10-18 (×13): 750 mg via ORAL
  Filled 2022-10-14 (×13): qty 2

## 2022-10-14 MED ORDER — POLYETHYLENE GLYCOL 3350 17 G PO PACK
17.0000 g | PACK | Freq: Every day | ORAL | Status: DC
  Administered 2022-10-15 – 2022-10-18 (×4): 17 g via ORAL
  Filled 2022-10-14 (×4): qty 1

## 2022-10-14 MED ORDER — CLONAZEPAM 0.5 MG PO TABS: 1.5000 mg | ORAL_TABLET | Freq: Two times a day (BID) | ORAL | 0 refills | Status: DC | PRN

## 2022-10-14 MED ORDER — BISACODYL 10 MG RE SUPP
10.0000 mg | Freq: Once | RECTAL | Status: AC
  Administered 2022-10-14: 10 mg via RECTAL
  Filled 2022-10-14: qty 1

## 2022-10-14 MED ORDER — OXYCODONE-ACETAMINOPHEN 5-325 MG PO TABS: 1.0000 | ORAL_TABLET | Freq: Four times a day (QID) | ORAL | 0 refills | Status: DC | PRN

## 2022-10-14 MED ORDER — SORBITOL 70 % SOLN
400.0000 mL | TOPICAL_OIL | Freq: Once | ORAL | Status: DC
  Filled 2022-10-14: qty 120

## 2022-10-14 NOTE — Assessment & Plan Note (Addendum)
Patient presenting with 1 to 2-day history of worsening shortness of breath with multifocal pneumonia seen on CTA.  Given recent hospitalization for COVID-19 pneumonia,  Per patient's family and EDP, wheezing noted; history of extensive tobacco use but no diagnosis of COPD.  No wheezing heard on examination.  12/1 -CTA chest negative for PE, no evidence of pneumonia but did show mild atelectasis --Incentive spirometry Per discussion with ID consult, this is likely improving covid pneumonia.  Given patient stable on room air and at her baseline no further treatment needed.  Stopped antibiotics.

## 2022-10-14 NOTE — Progress Notes (Addendum)
Brief progress note --   Discharge to SNF cancelled due to complaint later this morning about abdominal pain.   Patient found to have severe constipation, with large stool burden noted on abdominal xray.  Bowel regimen per orders including scheduled Miralax, Senna-S and suppository this afternoon. Will use enemas if no bowel movement after that.   Physical exam and Remainder of A&P as outlined in discharge summary.     Time spent: 35 minutes including time spent at bedside and in coordination of care.

## 2022-10-14 NOTE — Assessment & Plan Note (Signed)
Continue as needed cough medication

## 2022-10-14 NOTE — Assessment & Plan Note (Signed)
See palliative care consult

## 2022-10-14 NOTE — Assessment & Plan Note (Signed)
Continue as needed Percocet. Patient follows with [facility]. Appears there are plans for removal of left knee hardware and eventual knee replacement. --Follow up at [facility] as scheduled

## 2022-10-14 NOTE — Assessment & Plan Note (Addendum)
With secondary abdominal discomfort. 11/30 xray showed large stool burden throughout colon. --Scheduled daily Miralax, Senna-S BID -- Given Dulcolax suppository 11/30 --Enema was ordered but not given as patient had large BM --Adjust bowel regimen as needed

## 2022-10-14 NOTE — Assessment & Plan Note (Signed)
Per patient's daughter, patient is usually able to answer questions appropriately and 1 or 2 words.  She notes that mentation does fluctuate at times though. - Delirium precautions MRI of the brain negative for acute pathology.  Patient seems to be at baseline mental status now 

## 2022-10-14 NOTE — Assessment & Plan Note (Signed)
Complicates overall care and prognosis.  Recommend lifestyle modifications including physical activity and diet for weight loss and overall long-term health. ° °

## 2022-10-14 NOTE — Progress Notes (Signed)
   10/14/22 2021  Assess: MEWS Score  Temp 98.2 F (36.8 C)  BP (!) 128/91  MAP (mmHg) 103  Pulse Rate (!) 117  Resp 18  SpO2 96 %  O2 Device Room Air  Assess: MEWS Score  MEWS Temp 0  MEWS Systolic 0  MEWS Pulse 2  MEWS RR 0  MEWS LOC 0  MEWS Score 2  MEWS Score Color Yellow  Assess: if the MEWS score is Yellow or Red  Were vital signs taken at a resting state? Yes  Focused Assessment Change from prior assessment (see assessment flowsheet)  Does the patient meet 2 or more of the SIRS criteria? Yes  Does the patient have a confirmed or suspected source of infection? No  MEWS guidelines implemented *See Row Information* Yes  Treat  MEWS Interventions Administered scheduled meds/treatments;Administered prn meds/treatments  Pain Scale 0-10  Pain Score 7  Pain Type Acute pain  Pain Location Abdomen  Pain Orientation Upper;Mid  Pain Descriptors / Indicators Aching  Pain Frequency Constant  Pain Intervention(s) Medication (See eMAR)  Take Vital Signs  Increase Vital Sign Frequency  Yellow: Q 2hr X 2 then Q 4hr X 2, if remains yellow, continue Q 4hrs  Escalate  MEWS: Escalate Yellow: discuss with charge nurse/RN and consider discussing with provider and RRT  Notify: Charge Nurse/RN  Name of Charge Nurse/RN Notified Aura Camps, RN  Date Charge Nurse/RN Notified 10/14/22  Time Charge Nurse/RN Notified 2020  Provider Notification  Provider Name/Title Manuela Schwartz, NP  Date Provider Notified 10/14/22  Time Provider Notified 2020  Method of Notification Page (Secure Chat)  Notification Reason Change in status (Yellow MEWS)  Provider response No new orders  Assess: SIRS CRITERIA  SIRS Temperature  0  SIRS Pulse 1  SIRS Respirations  0  SIRS WBC 1  SIRS Score Sum  2
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
## 2022-10-14 NOTE — TOC Transition Note (Addendum)
Transition of Care [facility]) - CM/SW Discharge Note   Patient Details  Name: Gloria Aguilar MRN: [id number] Date of Birth: October 30, 1971  [facility]) CM/SW Contact:  Tempie Hoist, LCSWA Phone Number: 10/14/2022, 12:41 PM   Clinical Narrative:     RN is calling report to [facility]. Patient will be assigned bed A on A9.   TOC informed daughter of transfer. She is calling provider with medical questions.  TOC arranging transport with [facility].  1320: TOC arranging [facility] after abdominal X-ray comes back.  Final next level of care: Skilled Nursing Facility Barriers to Discharge: Barriers Resolved   Patient Goals and CMS Choice      [facility]  Discharge Placement PASRR number recieved: 10/14/22 Existing PASRR number confirmed : 10/14/22          Patient chooses bed at:  [facility]) Patient to be transferred to facility by: Ambulance Name of family member notified: Cain Saupe Patient and family notified of of transfer: 10/14/22  Discharge Plan and Services                 [facility]                    Social Determinants of Health (SDOH) Interventions     Readmission Risk Interventions     No data to display

## 2022-10-14 NOTE — Assessment & Plan Note (Signed)
Continued on home medications 

## 2022-10-14 NOTE — Assessment & Plan Note (Addendum)
Stable.  Pt does best answering yes/no questions.

## 2022-10-14 NOTE — Discharge Summary (Addendum)
Physician Discharge Summary   Patient: Gloria Aguilar MRN: [id number] DOB: Dec 01, 1970  Admit date:     10/09/2022  Discharge date: 10/15/22  Discharge Physician: Pennie Banter   PCP: Joana Reamer, DO   Recommendations at discharge:    Follow up with Primary Care in 1-2 weeks Repeat CBC, BMP, Mg in 1-2 weeks Monitor pain control and follow up with outpatient prescriber as scheduled Follow up with [facility] surgery as scheduled    Discharge Diagnoses: Principal Problem:   Multifocal pneumonia Active Problems:   Constipation   Traumatic brain injury (HCC)   Depression with anxiety   Obesity with body mass index (BMI) of 30.0 to 39.9   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  Was having left knee pain so x-ray was done which did not show any acute pathology.  Off antibiotics 11/28: Can go back to [facility] after COVID isolation completed on 11/30 per TOC.  Patient medically stable, palliative care consult   11/30 -- patient's clinical status is stable and improved.  Now off Covid isolation precautions. Medically stable to discharge to SNF/rehab.   11/30 afternoon update -- addendum Patient complained of abdominal pain today.  Abdominal xray shows large stool burden throughout the colon consistent with severe constipation.  Discharge has been cancelled for now.      Assessment and Plan: *  Multifocal pneumonia Patient presenting with 1 to 2-day history of worsening shortness of breath with multifocal pneumonia seen on CTA.  Given recent hospitalization for COVID-19 pneumonia,  Per patient's family and EDP, wheezing noted; history of extensive tobacco use but no diagnosis of COPD.  No wheezing heard on examination. Per discussion with ID consult, this is likely improving covid pneumonia.  Given patient stable on room air and at her baseline no further treatment needed.  Stopped antibiotics.  Constipation With secondary abdominal discomfort. Xray showed large stool burden throughout colon. --Scheduled daily Miralax, Senna-S BID --Dulcolax suppository today --Adjust bowel regimen as needed --Will use enemas if no BM this afternoon/evening  Diarrhea-resolved as of 10/14/2022 Patient's daughter notes history of diarrhea since contracting COVID-19 and starting Paxlovid.  Most likely drug related.  Now resolved  - C. difficile negative  Traumatic brain injury [facility]) Per patient's daughter, patient is usually able to answer questions appropriately and 1 or 2 words.  She notes that mentation does fluctuate at times though. - Delirium precautions MRI of the brain negative for acute pathology.  Patient seems to be at baseline mental status now  Depression with anxiety Continued on home medications  Obesity with body mass index (BMI) of 30.0 to 39.9 Complicates overall care and prognosis.  Recommend lifestyle modifications including physical activity and diet for weight loss and overall long-term health.   Sinus tachycardia HR's overnight 11/30-12/1 increased to 110's to 120's.  D-dimer was slightly elevated.  Doppler U/S of BLE's and CTA chest are both negative for DVT or PE.  Possibly related to her  musculoskeletal pain.  Abdominal pain has resolved after large BM yesterday. --Telemetry --Monitor for fever, pain, signs of infection  Palliative care by specialist See palliative  care consult  Bronchitis Continue as needed cough medication  Lower respiratory tract infection due to COVID-19 virus .  Musculoskeletal immobility Continue as needed Percocet. Patient follows with [facility] surgery. Appears there are plans for removal of left knee hardware and eventual knee replacement. 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  Pt does best answering yes/no questions.         Consultants: Palliative care Procedures performed: none  Disposition: Skilled nursing facility Diet recommendation:  Discharge Diet Orders (From admission, onward)     Start     Ordered   10/14/22 0000  Diet - low sodium heart healthy        10/14/22 1157            DISCHARGE MEDICATION: Allergies as of 10/15/2022       Reactions   Cephalexin Hives   Levetiracetam Itching   Morphine    Wound Dressing Adhesive         Medication List     STOP taking these medications    nirmatrelvir/ritonavir EUA 20 x 150 MG & 10 x 100MG  Tabs Commonly known as: PAXLOVID   predniSONE 50 MG tablet Commonly known as: DELTASONE       TAKE these medications    Acetaminophen 500 MG capsule Take 2 tablets by mouth 3 (three) times daily as needed.   albuterol 108 (90 Base) MCG/ACT inhaler Commonly known as: VENTOLIN HFA Inhale 2 puffs into the lungs every 4 (four) hours as needed for wheezing or shortness of breath.   allopurinol 100 MG tablet Commonly known as: ZYLOPRIM Take 100 mg by mouth daily.   aspirin EC 81 MG tablet Take 1 tablet by mouth 3 (three) times daily.   busPIRone 5 MG tablet Commonly known as: BUSPAR Take 5 mg by mouth 3 (three) times daily.   clonazePAM 0.5 MG tablet Commonly known as: KLONOPIN Take 3 tablets (1.5 mg total) by mouth 2 (two) times daily as needed.   dextromethorphan-guaiFENesin 30-600 MG 12hr tablet Commonly known as: MUCINEX DM Take 1 tablet by mouth 2 (two) times daily  as needed for cough.   diphenhydrAMINE 25 MG tablet Commonly known as: BENADRYL Take 25 mg by mouth 2 (two) times daily as needed.   DULoxetine 60 MG capsule Commonly known as: CYMBALTA Take 60 mg by mouth 2 (two) times daily.   gabapentin 300 MG capsule Commonly known as: NEURONTIN Take 600 mg by mouth 3 (three) times daily.   methocarbamol 750 MG tablet Commonly known as: ROBAXIN Take 1 tablet (750 mg total) by mouth every 8 (eight) hours as needed for muscle spasms.   mirtazapine 30 MG tablet Commonly known as: REMERON Take 30 mg by mouth at bedtime.   multivitamin with minerals tablet Take 1 tablet by mouth daily.   naproxen sodium 220 MG tablet Commonly known as: ALEVE Take 220 mg by mouth daily as needed.   nicotine 21 mg/24hr patch Commonly known as: NICODERM CQ - dosed in mg/24 hours Place 1 patch (21 mg total) onto the skin daily.   oxyCODONE-acetaminophen 5-325 MG tablet Commonly known as: PERCOCET/ROXICET Take 1 tablet by mouth every 6 (six) hours as needed for severe pain. What changed:  when to take this reasons to take this   polyethylene glycol 17  g packet Commonly known as: MIRALAX / GLYCOLAX Take 17 g by mouth daily as needed.   senna 8.6 MG Tabs tablet Commonly known as: SENOKOT Take 1 tablet by mouth daily as needed for mild constipation.   traZODone 150 MG tablet Commonly known as: DESYREL Take 150 mg by mouth at bedtime.         Contact information for after-discharge care     Destination     [facility] .   Service: Skilled Nursing Contact information: [address] [place] [id number] [phone]                    Discharge Exam: Filed Weights   10/09/22 1019 10/09/22 2039  Weight: 86 kg 81.6 kg   General exam: awake, alert, no acute distress, obese HEENT: moist mucus membranes, hearing grossly normal  Respiratory system: CTAB diminished bases due to poor inspiratory  effort, no wheezes, normal respiratory effort at rest, on room air. Cardiovascular system: normal S1/S2, RRR, no pedal edema.   Gastrointestinal system: soft, NT, ND, no HSM felt, +bowel sounds. Central nervous system: expressive aphasia, alert & oriented Extremities: left leg tenderness and healed surgical scars, no peripheral edema Skin: dry, intact, normal temperature Psychiatry: normal mood, congruent affect   Condition at discharge: stable  The results of significant diagnostics from this hospitalization (including imaging, microbiology, ancillary and laboratory) are listed below for reference.   Imaging Studies: CT Angio Chest Pulmonary Embolism (PE) W or WO Contrast  Result Date: 10/15/2022 CLINICAL DATA:  Pulmonary embolism (PE) suspected, high prob positive D-dimer, new O2 need EXAM: CT ANGIOGRAPHY CHEST WITH CONTRAST TECHNIQUE: Multidetector CT imaging of the chest was performed using the standard protocol during bolus administration of intravenous contrast. Multiplanar CT image reconstructions and MIPs were obtained to evaluate the vascular anatomy. RADIATION DOSE REDUCTION: This exam was performed according to the departmental dose-optimization program which includes automated exposure control, adjustment of the mA and/or kV according to patient size and/or use of iterative reconstruction technique. CONTRAST:  75mL OMNIPAQUE IOHEXOL 350 MG/ML SOLN COMPARISON:  Chest XR and CTA PE, 10/09/2022. FINDINGS: Suboptimal evaluation, secondary to motion degradation. Cardiovascular: Satisfactory opacification of the pulmonary arteries to the segmental level. No segmental or larger pulmonary embolus. Conventional 3-sided LEFT aortic arch. No atherosclerosis or hemodynamic stenosis at the origins of the great vessels. Normal heart size. No pericardial effusion. Mediastinum/Nodes: No enlarged mediastinal, hilar, or axillary lymph nodes. Thyroid gland, trachea, and esophagus demonstrate no significant  findings. Lungs/Pleura: Low lung volumes with mild basilar atelectasis. No additional focal consolidation, suspicious pulmonary nodule or mass. No pleural effusion or pneumothorax. Upper Abdomen: No acute abnormality. Dependent layering gallstones within a nondistended gallbladder. Musculoskeletal: Bilateral subpectoral breast implant augmentation. No acute chest wall abnormality. No significant osseous findings. Review of the MIP images confirms the above findings. IMPRESSION: Suboptimal evaluation, within these constraints; 1.  No segmental or larger pulmonary embolus. 2. Pulmonary hypoinflation with mild atelectasis. 3. Cholelithiasis. No additional CT evidence to suggest acute cholecystitis. Additional incidental, chronic and senescent findings as above. Electronically Signed   By: Roanna BanningJon  Mugweru M.D.   On: 10/15/2022 12:51   US Venous Img Lower Bilateral (DVT)  Result Date: 10/15/2022 CLINICAL DATA:  51 year old with positive D-dimer. EXAM: BILATERAL LOWER EXTREMITY VENOUS DOPPLER ULTRASOUND TECHNIQUE: Gray-scale sonography with graded compression, as well as color Doppler and duplex ultrasound were performed to evaluate the lower extremity deep venous systems from the level of the common  femoral vein and including the common femoral, femoral, profunda femoral, popliteal and calf veins including the posterior tibial, peroneal and gastrocnemius veins when visible. The superficial great saphenous vein was also interrogated. Spectral Doppler was utilized to evaluate flow at rest and with distal augmentation maneuvers in the common femoral, femoral and popliteal veins. COMPARISON:  10/03/2022 FINDINGS: RIGHT LOWER EXTREMITY Common Femoral Vein: No evidence of thrombus. Normal compressibility, respiratory phasicity and response to augmentation. Saphenofemoral Junction: No evidence of thrombus. Normal compressibility and flow on color Doppler imaging. Profunda Femoral Vein: No evidence of thrombus. Normal  compressibility and flow on color Doppler imaging. Femoral Vein: No evidence of thrombus. Normal compressibility, respiratory phasicity and response to augmentation. Popliteal Vein: No evidence of thrombus. Normal compressibility, respiratory phasicity and response to augmentation. Calf Veins: Visualized right deep calf veins are patent without thrombus. Superficial Great Saphenous Vein: No evidence of thrombus. Normal compressibility. Other Findings:  None. LEFT LOWER EXTREMITY Common Femoral Vein: No evidence of thrombus. Normal compressibility, respiratory phasicity and response to augmentation. Saphenofemoral Junction: No evidence of thrombus. Normal compressibility and flow on color Doppler imaging. Profunda Femoral Vein: No evidence of thrombus. Normal compressibility and flow on color Doppler imaging. Femoral Vein: No evidence of thrombus. Normal compressibility, respiratory phasicity and response to augmentation. Popliteal Vein: No evidence of thrombus. Normal compressibility, respiratory phasicity and response to augmentation. Calf Veins: Visualized left deep calf veins are patent without thrombus. Superficial Great Saphenous Vein: No evidence of thrombus. Normal compressibility. Other Findings:  None. IMPRESSION: No evidence of deep venous thrombosis in either lower extremity. Electronically Signed   By: Richarda Overlie M.D.   On: 10/15/2022 12:33   DG Abd 1 View  Result Date: 10/14/2022 CLINICAL DATA:  Abdominal pain. EXAM: ABDOMEN - 1 VIEW COMPARISON:  None Available. FINDINGS: Fairly large amount of stool throughout the:. No dilated large or small bowel loops are identified. No evidence of free intraperitoneal air. No evidence of renal or ureteral calculi. No acute-appearing osseous abnormality. Fixation hardware at both hips. IMPRESSION: 1. Fairly large amount of stool throughout the colon, suggesting constipation. 2. Nonobstructive bowel gas pattern. Electronically Signed   By: Bary Richard M.D.    On: 10/14/2022 13:17   DG Knee Complete 4 Views Left  Result Date: 10/11/2022 CLINICAL DATA:  Left knee pain EXAM: LEFT KNEE - COMPLETE 4+ VIEW COMPARISON:  radiographs 07/17/2020 FINDINGS: Extensive changes from previous IM rod and screw fixation of the distal left femur and proximal left tibia. No radiographic evidence of loosening. Definite acute fracture. Lateral subluxation of the tibia in relation to the femur measuring approximately 8 mm. Advanced degenerative arthritis about the left knee with complete medial compartment narrowing with bone-on-bone contact and subchondral sclerosis. No knee joint effusion. IMPRESSION: Extensive postoperative changes about the left knee. No definite acute finding. Advanced degenerative arthritis with complete loss of joint space in the medial compartment. Electronically Signed   By: Minerva Fester M.D.   On: 10/11/2022 20:16   MR BRAIN WO CONTRAST  Result Date: 10/10/2022 CLINICAL DATA:  History of CVA with aphasia and right-sided weakness presents with shortness of breath and altered mental status. EXAM: MRI HEAD WITHOUT CONTRAST TECHNIQUE: Multiplanar, multiecho pulse sequences of the brain and surrounding structures were obtained without intravenous contrast. COMPARISON:  CT head 10/03/2022 FINDINGS: Brain: There is no acute intracranial hemorrhage, extra-axial fluid collection, or acute infarct. There is a large left MCA territorial infarct with associated encephalomalacia, gliosis, ex vacuo dilatation of the left lateral ventricle,  and small amount of chronic blood products. Additional smaller cortical infarcts are seen in the right cerebral hemisphere. Background parenchymal volume otherwise appears normal. The ventricles are otherwise normal in size. There is no mass lesion.  There is no mass effect or midline shift. Vascular: Normal flow voids. Skull and upper cervical spine: Normal marrow signal. Sinuses/Orbits: The paranasal sinuses are clear. The globes  and orbits are unremarkable. Other: None. IMPRESSION: 1. No acute intracranial pathology. 2. Unchanged large remote left MCA territorial infarct and smaller remote infarcts in the right cerebral hemisphere. Electronically Signed   By: Lesia Hausen M.D.   On: 10/10/2022 12:29   CT Angio Chest PE W and/or Wo Contrast  Result Date: 10/09/2022 CLINICAL DATA:  Recent COVID, shortness of breath EXAM: CT ANGIOGRAPHY CHEST WITH CONTRAST TECHNIQUE: Multidetector CT imaging of the chest was performed using the standard protocol during bolus administration of intravenous contrast. Multiplanar CT image reconstructions and MIPs were obtained to evaluate the vascular anatomy. RADIATION DOSE REDUCTION: This exam was performed according to the departmental dose-optimization program which includes automated exposure control, adjustment of the mA and/or kV according to patient size and/or use of iterative reconstruction technique. CONTRAST:  32mL OMNIPAQUE IOHEXOL 350 MG/ML SOLN COMPARISON:  Chest x-ray 10/09/2022 FINDINGS: Cardiovascular: Satisfactory opacification of the pulmonary arteries to the segmental level. No evidence of pulmonary embolism. Nonaneurysmal aorta. No dissection is seen. Upper normal cardiac size. Trace pericardial effusion Mediastinum/Nodes: Midline trachea. No thyroid mass. Borderline right paratracheal nodes measuring up to 10 mm. Esophagus within normal limits. Lungs/Pleura: Heterogeneous bilateral ground-glass densities and patchy consolidations, worse in the right upper lobe, consistent with bilateral pneumonia. No pleural effusion or pneumothorax. Upper Abdomen: No acute abnormality. Musculoskeletal: Bilateral breast implants with mild capsular calcification. No acute osseous abnormality Review of the MIP images confirms the above findings. IMPRESSION: 1. Negative for acute pulmonary embolus or aortic dissection. 2. Heterogeneous bilateral ground-glass densities and patchy consolidations, worst in  the right upper lobe, consistent with bilateral pneumonia Electronically Signed   By: Jasmine Pang M.D.   On: 10/09/2022 17:52   DG Chest Port 1 View  Result Date: 10/09/2022 CLINICAL DATA:  Shortness of breath. EXAM: PORTABLE CHEST 1 VIEW COMPARISON:  October 03, 2022. FINDINGS: Stable cardiomediastinal silhouette. Mild central pulmonary vascular congestion is noted with possible bilateral pulmonary edema. Bony thorax is unremarkable. IMPRESSION: Mild central pulmonary vascular congestion is noted with possible bilateral pulmonary edema. Electronically Signed   By: Lupita Raider M.D.   On: 10/09/2022 11:24   US Venous Img Lower Bilateral (DVT)  Result Date: 10/03/2022 CLINICAL DATA:  Cough, shortness of breath and fever. Fall. Question DVT. EXAM: BILATERAL LOWER EXTREMITY VENOUS DOPPLER ULTRASOUND TECHNIQUE: Gray-scale sonography with graded compression, as well as color Doppler and duplex ultrasound were performed to evaluate the lower extremity deep venous systems from the level of the common femoral vein and including the common femoral, femoral, profunda femoral, popliteal and calf veins including the posterior tibial, peroneal and gastrocnemius veins when visible. The superficial great saphenous vein was also interrogated. Spectral Doppler was utilized to evaluate flow at rest and with distal augmentation maneuvers in the common femoral, femoral and popliteal veins. COMPARISON:  None Available. FINDINGS: RIGHT LOWER EXTREMITY Common Femoral Vein: No evidence of thrombus. Normal compressibility. Patient refused color and Doppler imaging. Saphenofemoral Junction: No evidence of thrombus. Normal compressibility. Patient refused color and Doppler imaging. Profunda Femoral Vein: No evidence of thrombus. Normal compressibility. Patient refused color and Doppler imaging. Femoral  Vein: No evidence of thrombus. Normal compressibility. Patient refused color and Doppler imaging. Popliteal Vein: Not imaged.   Patient refused further imaging. Calf Veins: Not imaged.  Patient refused further imaging Superficial Great Saphenous Vein: Not imaged. Patient refused further imaging Venous Reflux:  None. Other Findings:  None. LEFT LOWER EXTREMITY Common Femoral Vein: No evidence of thrombus. Normal compressibility, respiratory phasicity and response to augmentation. Saphenofemoral Junction: No evidence of thrombus. Normal compressibility and flow on color Doppler imaging. Profunda Femoral Vein: No evidence of thrombus. Normal compressibility and flow on color Doppler imaging. Femoral Vein: No evidence of thrombus. Normal compressibility, respiratory phasicity and response to augmentation. Popliteal Vein: No evidence of thrombus. Normal compressibility, respiratory phasicity and response to augmentation. Calf Veins: No evidence of thrombus. Normal compressibility and flow on color Doppler imaging. Superficial Great Saphenous Vein: No evidence of thrombus. Normal compressibility. Venous Reflux:  None. Other Findings:  None. IMPRESSION: No evidence of deep venous thrombosis in either lower extremity. Of note, incomplete evaluation was performed in the right lower extremity as described above. Electronically Signed   By: Marin Roberts M.D.   On: 10/03/2022 16:23   CT CERVICAL SPINE WO CONTRAST  Result Date: 10/03/2022 CLINICAL DATA:  Neck trauma, fall EXAM: CT CERVICAL SPINE WITHOUT CONTRAST TECHNIQUE: Multidetector CT imaging of the cervical spine was performed without intravenous contrast. Multiplanar CT image reconstructions were also generated. RADIATION DOSE REDUCTION: This exam was performed according to the departmental dose-optimization program which includes automated exposure control, adjustment of the mA and/or kV according to patient size and/or use of iterative reconstruction technique. COMPARISON:  03/27/2020 FINDINGS: Alignment: Facet joints are aligned without dislocation or traumatic listhesis. Dens and  lateral masses are aligned. Straightening with smooth reversal of the cervical lordosis. Skull base and vertebrae: No acute fracture. No primary bone lesion or focal pathologic process. Soft tissues and spinal canal: No prevertebral fluid or swelling. No visible canal hematoma. Disc levels:  Degenerative disc disease of C5-6 is similar to prior. Upper chest: Hypoventilatory changes within the included lung fields. Other: None. IMPRESSION: 1. No acute fracture or traumatic listhesis of the cervical spine. 2. Straightening with smooth reversal of the cervical lordosis, which may be secondary to positioning or muscle spasm. Electronically Signed   By: Duanne Guess D.O.   On: 10/03/2022 11:39   CT HEAD WO CONTRAST ( )  Result Date: 10/03/2022 CLINICAL DATA:  Trauma EXAM: CT HEAD WITHOUT CONTRAST TECHNIQUE: Contiguous axial images were obtained from the base of the skull through the vertex without intravenous contrast. RADIATION DOSE REDUCTION: This exam was performed according to the departmental dose-optimization program which includes automated exposure control, adjustment of the mA and/or kV according to patient size and/or use of iterative reconstruction technique. COMPARISON:  CT head 03/27/20 FINDINGS: Brain: Evolving large left MCA territory infarct involving large portions of the left frontal, parietal, and anterior temporal lobes. No evidence of hemorrhage. No hydrocephalus. No extra-axial fluid collection. There is mild ex vacuo dilatation of the left lateral ventricular system. Vascular: No hyperdense vessel or unexpected calcification. Skull: Prior maxillary and mandibular fixation changes. Sinuses/Orbits: Mild mucosal thickening bilateral maxillary sinuses. There is a chronic fracture of the lateral wall of the left maxillary sinus. Impacted cerumen in bilateral external auditory canals. Other: None. IMPRESSION: Evolving large left MCA territory infarct. No evidence of hemorrhage or acute  intracranial injury. Electronically Signed   By: Lorenza Cambridge M.D.   On: 10/03/2022 11:38   CT ABDOMEN PELVIS W CONTRAST  Result  Date: 10/03/2022 CLINICAL DATA:  Abdominal pain, acute, nonlocalized EXAM: CT ABDOMEN AND PELVIS WITH CONTRAST TECHNIQUE: Multidetector CT imaging of the abdomen and pelvis was performed using the standard protocol following bolus administration of intravenous contrast. RADIATION DOSE REDUCTION: This exam was performed according to the departmental dose-optimization program which includes automated exposure control, adjustment of the mA and/or kV according to patient size and/or use of iterative reconstruction technique. CONTRAST:  OMNIPAQUE IOHEXOL 300 MG/ML  SOLN COMPARISON:  08/30/2014 FINDINGS: Lower chest: Dependent atelectasis. Symmetric inflation of the covered breast implants. Hepatobiliary: There could be hepatic steatosis but limited by post contrast timing.Cholelithiasis. No evidence of biliary inflammation. Pancreas: Unremarkable. Spleen: Unremarkable. Adrenals/Urinary Tract: Negative adrenals. No hydronephrosis or stone. Unremarkable bladder. Stomach/Bowel:  No obstruction. No appendicitis. Vascular/Lymphatic: No acute vascular abnormality. No mass or adenopathy. Reproductive:No pathologic findings. Other: No ascites or pneumoperitoneum. Musculoskeletal: No acute abnormalities. Femoral neck fixation on both sides. IMPRESSION: 1. No acute intra-abdominal finding. 2. Cholelithiasis and suspected hepatic steatosis. 3. Atelectasis at the lung bases. Electronically Signed   By: Tiburcio Pea M.D.   On: 10/03/2022 06:29   DG Chest Port 1 View  Result Date: 10/03/2022 CLINICAL DATA:  Fall from bed.  Questionable sepsis EXAM: PORTABLE CHEST 1 VIEW COMPARISON:  07/17/2020 FINDINGS: Limited low volume chest with diffuse interstitial crowding. Linear atelectasis on the right. Cardiopericardial enlargement. No visible effusion or pneumothorax. Extensive artifact from  EKG leads. IMPRESSION: Very low volume chest with atelectasis.  No convincing pneumonia. Electronically Signed   By: Tiburcio Pea M.D.   On: 10/03/2022 04:42    Microbiology: Results for orders placed or performed during the hospital encounter of 10/09/22  MRSA Next Gen by PCR, Nasal     Status: None   Collection Time: 10/09/22  6:30 PM   Specimen: Nasal Mucosa; Nasal Swab  Result Value Ref Range Status   MRSA by PCR Next Gen NOT DETECTED NOT DETECTED Final    Comment: (NOTE) The GeneXpert MRSA Assay (FDA approved for NASAL specimens only), is one component of a comprehensive MRSA colonization surveillance program. It is not intended to diagnose MRSA infection nor to guide or monitor treatment for MRSA infections. Test performance is not FDA approved in patients less than 54 years old. Performed at [facility], [address] [place], [place] [address]   Blood culture (routine x 2)     Status: None   Collection Time: 10/09/22  7:57 PM   Specimen: BLOOD  Result Value Ref Range Status   Specimen Description BLOOD BLOOD RIGHT FOREARM  Final   Special Requests   Final    BOTTLES DRAWN AEROBIC AND ANAEROBIC Blood Culture results may not be optimal due to an excessive volume of blood received in culture bottles   Culture   Final    NO GROWTH 5 DAYS Performed at [facility], [address], [place], [place] [address]    Report Status 10/14/2022 FINAL  Final  Culture, blood (Routine X 2) w Reflex to ID Panel     Status: None   Collection Time: 10/10/22  6:11 AM   Specimen: BLOOD  Result Value Ref Range Status   Specimen Description BLOOD LEFT HAND  Final   Special Requests   Final    BOTTLES DRAWN AEROBIC AND ANAEROBIC Blood Culture adequate volume   Culture   Final    NO GROWTH 5 DAYS Performed at [facility], [address], [place], [place] [address]    Report  Status 10/15/2022 FINAL  Final  Respiratory (~20 pathogens) panel by PCR     Status:  None   Collection Time: 10/11/22 11:57 AM   Specimen: Nasopharyngeal Swab; Respiratory  Result Value Ref Range Status   Adenovirus NOT DETECTED NOT DETECTED Final   Coronavirus 229E NOT DETECTED NOT DETECTED Final    Comment: (NOTE) The Coronavirus on the Respiratory Panel, DOES NOT test for the novel  Coronavirus (2019 nCoV)    Coronavirus HKU1 NOT DETECTED NOT DETECTED Final   Coronavirus NL63 NOT DETECTED NOT DETECTED Final   Coronavirus OC43 NOT DETECTED NOT DETECTED Final   Metapneumovirus NOT DETECTED NOT DETECTED Final   Rhinovirus / Enterovirus NOT DETECTED NOT DETECTED Final   Influenza A NOT DETECTED NOT DETECTED Final   Influenza B NOT DETECTED NOT DETECTED Final   Parainfluenza Virus 1 NOT DETECTED NOT DETECTED Final   Parainfluenza Virus 2 NOT DETECTED NOT DETECTED Final   Parainfluenza Virus 3 NOT DETECTED NOT DETECTED Final   Parainfluenza Virus 4 NOT DETECTED NOT DETECTED Final   Respiratory Syncytial Virus NOT DETECTED NOT DETECTED Final   Bordetella pertussis NOT DETECTED NOT DETECTED Final   Bordetella Parapertussis NOT DETECTED NOT DETECTED Final   Chlamydophila pneumoniae NOT DETECTED NOT DETECTED Final   Mycoplasma pneumoniae NOT DETECTED NOT DETECTED Final    Comment: Performed at [facility], [address] [place], [place] [address]    Labs: CBC: Recent Labs  Lab 10/09/22 1123 10/10/22 0559 10/11/22 0533 10/15/22 0057  WBC 20.1* 14.9* 9.2 11.4*  NEUTROABS  --   --   --  7.7  HGB 12.6 12.1 11.4* 13.7  HCT 36.0 33.8* 31.9* 39.4  MCV 87.2 86.4 87.2 88.3  PLT 291 272 258 292   Basic Metabolic Panel: Recent Labs  Lab 10/09/22 1123 10/10/22 0611 10/11/22 0533 10/15/22 0057  NA 141 139 138 136  K 3.5 4.0 4.2 4.5  CL 103 106 106 101  CO2 30 26 25 26   GLUCOSE 93 140* 91 132*  BUN 22* 17 28* 21*  CREATININE 0.68 0.53 0.62 0.75  CALCIUM 8.8* 8.3* 8.4* 9.2  MG  --   --   --  2.1   Liver Function Tests: Recent Labs  Lab 10/09/22 1123  10/11/22 0533 10/15/22 0057  AST 16 17 29   ALT 21 19 38  ALKPHOS 112 89 121  BILITOT 0.6 0.6 0.7  PROT 7.3 6.3* 7.6  ALBUMIN 3.8 2.9* 3.7   CBG: No results for input(s): "GLUCAP" in the last 168 hours.  Discharge time spent: less than 30 minutes.  Signed: 14/01/23, DO [facility] 10/15/2022

## 2022-10-15 ENCOUNTER — Inpatient Hospital Stay: Payer: Medicaid Other

## 2022-10-15 DIAGNOSIS — S069X9S Unspecified intracranial injury with loss of consciousness of unspecified duration, sequela: Secondary | ICD-10-CM | POA: Diagnosis not present

## 2022-10-15 DIAGNOSIS — R000 Tachycardia, unspecified: Secondary | ICD-10-CM | POA: Clinically undetermined

## 2022-10-15 DIAGNOSIS — Z515 Encounter for palliative care: Secondary | ICD-10-CM | POA: Diagnosis not present

## 2022-10-15 DIAGNOSIS — Z7409 Other reduced mobility: Secondary | ICD-10-CM | POA: Diagnosis not present

## 2022-10-15 DIAGNOSIS — J189 Pneumonia, unspecified organism: Secondary | ICD-10-CM | POA: Diagnosis not present

## 2022-10-15 DIAGNOSIS — S098XXA Other specified injuries of head, initial encounter: Secondary | ICD-10-CM

## 2022-10-15 DIAGNOSIS — K5900 Constipation, unspecified: Secondary | ICD-10-CM

## 2022-10-15 DIAGNOSIS — R4701 Aphasia: Secondary | ICD-10-CM

## 2022-10-15 LAB — COMPREHENSIVE METABOLIC PANEL
ALT: 38 U/L (ref 0–44)
AST: 29 U/L (ref 15–41)
Albumin: 3.7 g/dL (ref 3.5–5.0)
Alkaline Phosphatase: 121 U/L (ref 38–126)
Anion gap: 9 (ref 5–15)
BUN: 21 mg/dL — ABNORMAL HIGH (ref 6–20)
CO2: 26 mmol/L (ref 22–32)
Calcium: 9.2 mg/dL (ref 8.9–10.3)
Chloride: 101 mmol/L (ref 98–111)
Creatinine, Ser: 0.75 mg/dL (ref 0.44–1.00)
GFR, Estimated: >60 mL/min (ref >60)
Glucose, Bld: 132 mg/dL — ABNORMAL HIGH (ref 70–99)
Potassium: 4.5 mmol/L (ref 3.5–5.1)
Sodium: 136 mmol/L (ref 135–145)
Total Bilirubin: 0.7 mg/dL (ref 0.3–1.2)
Total Protein: 7.6 g/dL (ref 6.5–8.1)

## 2022-10-15 LAB — CBC WITH DIFFERENTIAL/PLATELET
Abs Immature Granulocytes: 0.07 10*3/uL (ref 0.00–0.07)
Basophils Absolute: 0 10*3/uL (ref 0.0–0.1)
Basophils Relative: 0 %
Eosinophils Absolute: 0.4 10*3/uL (ref 0.0–0.5)
Eosinophils Relative: 4 %
HCT: 39.4 % (ref 36.0–46.0)
Hemoglobin: 13.7 g/dL (ref 12.0–15.0)
Immature Granulocytes: 1 %
Lymphocytes Relative: 20 %
Lymphs Abs: 2.3 10*3/uL (ref 0.7–4.0)
MCH: 30.7 pg (ref 26.0–34.0)
MCHC: 34.8 g/dL (ref 30.0–36.0)
MCV: 88.3 fL (ref 80.0–100.0)
Monocytes Absolute: 0.8 10*3/uL (ref 0.1–1.0)
Monocytes Relative: 7 %
Neutro Abs: 7.7 10*3/uL (ref 1.7–7.7)
Neutrophils Relative %: 68 %
Platelets: 292 10*3/uL (ref 150–400)
RBC: 4.46 MIL/uL (ref 3.87–5.11)
RDW: 14.1 % (ref 11.5–15.5)
WBC: 11.4 10*3/uL — ABNORMAL HIGH (ref 4.0–10.5)
nRBC: 0 % (ref 0.0–0.2)

## 2022-10-15 LAB — URINALYSIS, COMPLETE (UACMP) WITH MICROSCOPIC
Bacteria, UA: NONE SEEN
Bilirubin Urine: NEGATIVE
Glucose, UA: NEGATIVE mg/dL
Hgb urine dipstick: NEGATIVE
Ketones, ur: NEGATIVE mg/dL
Leukocytes,Ua: NEGATIVE
Nitrite: NEGATIVE
Protein, ur: NEGATIVE mg/dL
Specific Gravity, Urine: 1.033 — ABNORMAL HIGH (ref 1.005–1.030)
pH: 7 (ref 5.0–8.0)

## 2022-10-15 LAB — LACTIC ACID, PLASMA
Lactic Acid, Venous: 1.6 mmol/L (ref 0.5–1.9)
Lactic Acid, Venous: 1.9 mmol/L (ref 0.5–1.9)

## 2022-10-15 LAB — CULTURE, BLOOD (ROUTINE X 2)
Culture: NO GROWTH
Special Requests: ADEQUATE

## 2022-10-15 LAB — MAGNESIUM: Magnesium: 2.1 mg/dL (ref 1.7–2.4)

## 2022-10-15 LAB — D-DIMER, QUANTITATIVE: D-Dimer, Quant: 0.74 ug/mL-FEU — ABNORMAL HIGH (ref 0.00–0.50)

## 2022-10-15 MED ORDER — SODIUM CHLORIDE 0.9 % IV SOLN
1.0000 g | INTRAVENOUS | Status: DC
  Administered 2022-10-15 – 2022-10-17 (×3): 1 g via INTRAVENOUS
  Filled 2022-10-15 (×3): qty 1

## 2022-10-15 MED ORDER — IOHEXOL 350 MG/ML SOLN
75.0000 mL | Freq: Once | INTRAVENOUS | Status: AC | PRN
  Administered 2022-10-15: 75 mL via INTRAVENOUS

## 2022-10-15 NOTE — Assessment & Plan Note (Signed)
HR's overnight 11/30-12/1 increased to 110's to 120's.  D-dimer was slightly elevated.  Doppler U/S of BLE's and CTA chest are both negative for DVT or PE.  Possibly related to her musculoskeletal pain.  Abdominal pain has resolved after large BM yesterday. --Telemetry --Monitor for fever, pain, signs of infection 12/2: suspect mild tachycardia related to pain. 12/4: HR's improved today, abdominal pain is better, supports tachycardia due to pain

## 2022-10-15 NOTE — Progress Notes (Signed)
Progress Note   Patient: Gloria Aguilar CZY:[id number] DOB: September 01, 1971 DOA: 10/09/2022     6 DOS: the patient was seen and examined on 10/16/2022   Brief hospital course: 51 y.o. 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  Was having left knee pain so x-ray was done which did not show any acute pathology.  Off antibiotics 11/28: Can go back to [facility] after COVID isolation completed on 11/30 per TOC.  Patient medically stable, palliative care consult  11/30: off Covid isolation.  Discharge cancelled due to abdominal pain. Xray showed extensive stool burden throughout colon.  Bowel regimen increased with suppository and enema ordered.  12/1: HR"s increased overnight and remained elevated.  Ruled out DVT/PE.  Chest xray with ?infection vs atelectasis.  Pt requiring oxygen again.  Discharge again deferred and started on empiric antibiotics. Resumed cardiac monitoring, remains sinus rhythm.  Assessment and Plan: * Multifocal pneumonia Patient presenting with 1 to 2-day history of worsening shortness of breath with multifocal pneumonia seen on CTA.  Given recent hospitalization for COVID-19 pneumonia,  Per patient's family and EDP, wheezing noted; history of extensive tobacco use but no diagnosis of COPD.  No wheezing heard on examination.  12/1 -CTA chest negative for PE, no evidence of pneumonia but did show mild atelectasis --Incentive spirometry Per discussion with ID consult, this is likely improving covid pneumonia.  Given patient stable on room air and at her baseline no further treatment needed.  Stopped antibiotics.  Lower respiratory tract infection due to COVID-19 virus .  Sinus  tachycardia HR's overnight 11/30-12/1 increased to 110's to 120's.  D-dimer was slightly elevated.  Doppler U/S of BLE's and CTA chest are both negative for DVT or PE.  Possibly related to her musculoskeletal pain.  Abdominal pain has resolved after large BM yesterday. --Telemetry --Monitor for fever, pain, signs of infection  Constipation With secondary abdominal discomfort. 11/30 xray showed large stool burden throughout colon. --Scheduled daily Miralax, Senna-S BID -- Given Dulcolax suppository 11/30 --Enema was ordered but not given as patient had large BM --Adjust bowel regimen as needed  Diarrhea-resolved as of 10/14/2022 Patient's daughter notes history of diarrhea since contracting COVID-19 and starting Paxlovid.  Most likely drug related.  Now resolved  - C. difficile negative  Traumatic brain injury [facility]) Per patient's daughter, patient is usually able to answer questions appropriately and 1 or 2 words.  She notes that mentation does fluctuate at times though. - Delirium precautions MRI of the brain negative for acute pathology.  Patient seems to be at baseline mental status now  Depression with anxiety Continued on home medications  Obesity with body mass index (BMI) of 30.0 to 39.9 Complicates overall care and prognosis.  Recommend lifestyle modifications including physical activity and diet for weight loss and overall long-term health.   Palliative care by specialist See palliative care consult  Bronchitis Continue as needed cough medication  Musculoskeletal immobility Continue as needed Percocet. Patient follows with [facility] surgery. Appears there are plans for removal of left knee hardware and eventual knee replacement. --Follow up at [facility] as scheduled  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) Stable.  Pt does best answering yes/no questions.        Subjective: Patient awake sitting  up in bed when seen on rounds this morning.  She is  reporting uncontrolled left leg pain which is chronic.  Heating pad is being used.  Physical Exam: Vitals:   10/15/22 2201 10/15/22 2352 10/16/22 0405 10/16/22 0616  BP: 109/75 101/74 103/72 99/70  Pulse: (!) 124 (!) 115 (!) 109 (!) 103  Resp: 20 20 18 20   Temp: 98.5 F (36.9 C) 98.6 F (37 C) 98.4 F (36.9 C) 98.4 F (36.9 C)  TempSrc:  Oral Oral Oral  SpO2: 97% 95% 96% 92%  Weight:      Height:       52 year old female lying in the bed comfortably in no acute distress Lungs clear with diminished bases.  No wheezing or rales Cardiovascular regular rate and rhythm Abdomen soft, benign Skin no rash or lesion Psych: Impaired cognition Neuro alert and awake.  Oriented to self.  Expressive aphasia and right-sided weakness from previous stroke   Data Reviewed:  Metabolic panel overnight normal except for glucose 132, BUN 21.  Lactic acid normal times two 1.6, 1.9.  CBC with white count 11.4 up slightly from 9.2.  D-dimer mildly elevated 0.74.  Bilateral lower extremity Doppler ultrasound negative for DVT bilaterally.  CTA chest negative for PE, showed hypoinflation and mild atelectasis.   Family Communication: Unable to reach daughter by phone, left voicemail  Disposition: Status is: Inpatient Remains inpatient appropriate because: New tachycardia of unclear cause with ongoing evaluation, also now requiring oxygen without baseline requirement. Discharge to SNF hopefully tomorrow 12/2.      Planned Discharge Destination: Skilled nursing facility   DVT prophylaxis-Lovenox  Time spent: 45 minutes  Author: 14/2, DO 10/16/2022 8:09 AM  For on call review [email or web address].

## 2022-10-15 NOTE — TOC Progression Note (Addendum)
Transition of Care [facility]) - Progression Note    Patient Details  Name: Gloria Aguilar MRN: [id number] Date of Birth: July 06, 1971  Transition of Care [facility]) CM/SW Contact  Tempie Hoist, [place] Phone Number: 10/15/2022, 2:03 PM  Clinical Narrative:     The patient will need to discharge tomorrow as the provider is still monitoring her heart rate.  TOC is calling Ricky at [facility] with the updates.  TOC told Ricky at [facility] that the patient still has an elevated heart rate. Patient's LTC Medicaid is currently in place.    Barriers to Discharge: Barriers Resolved  Expected Discharge Plan and Services      [facility]     Expected Discharge Date: 10/14/22                                     Social Determinants of Health (SDOH) Interventions    Readmission Risk Interventions     No data to display
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
## 2022-10-15 NOTE — Progress Notes (Addendum)
PT Cancellation Note  Patient Details Name: Gloria Aguilar MRN: [id number] DOB: 29-Jul-1971   Cancelled Treatment:    Reason Eval/Treat Not Completed: Other (comment) Pt would not fully wake up for PT, HR 111 and spO2>90%, momentarily cracked her eyes when asked, but unable to meaningfully participate with therapy. RN notified and in room to assess pt. PT will continue as able.   Marlana Mckowen O'Daniel, SPT  10/15/2022, 2:30 PM

## 2022-10-16 ENCOUNTER — Inpatient Hospital Stay: Payer: Medicaid Other

## 2022-10-16 DIAGNOSIS — J189 Pneumonia, unspecified organism: Secondary | ICD-10-CM | POA: Diagnosis not present

## 2022-10-16 LAB — CBC
HCT: 37.8 % (ref 36.0–46.0)
Hemoglobin: 13.3 g/dL (ref 12.0–15.0)
MCH: 30.9 pg (ref 26.0–34.0)
MCHC: 35.2 g/dL (ref 30.0–36.0)
MCV: 87.7 fL (ref 80.0–100.0)
Platelets: 285 10*3/uL (ref 150–400)
RBC: 4.31 MIL/uL (ref 3.87–5.11)
RDW: 14.1 % (ref 11.5–15.5)
WBC: 7.1 10*3/uL (ref 4.0–10.5)
nRBC: 0 % (ref 0.0–0.2)

## 2022-10-16 MED ORDER — CEFDINIR 300 MG PO CAPS: 300.0000 mg | ORAL_CAPSULE | Freq: Two times a day (BID) | ORAL | 0 refills | Status: DC

## 2022-10-16 MED ORDER — NALOXEGOL OXALATE 12.5 MG PO TABS
12.5000 mg | ORAL_TABLET | Freq: Every day | ORAL | Status: DC
  Administered 2022-10-16: 12.5 mg via ORAL
  Filled 2022-10-16 (×2): qty 1

## 2022-10-16 MED ORDER — CLONAZEPAM 0.5 MG PO TABS
1.5000 mg | ORAL_TABLET | Freq: Two times a day (BID) | ORAL | Status: DC
  Administered 2022-10-16 – 2022-10-18 (×4): 1.5 mg via ORAL
  Filled 2022-10-16 (×4): qty 3

## 2022-10-16 MED ORDER — METHOCARBAMOL 750 MG PO TABS: 750.0000 mg | ORAL_TABLET | Freq: Three times a day (TID) | ORAL | Status: AC | PRN

## 2022-10-16 MED ORDER — FLEET ENEMA 7-19 GM/118ML RE ENEM
1.0000 | ENEMA | Freq: Once | RECTAL | Status: AC
  Administered 2022-10-16: 1 via RECTAL

## 2022-10-16 MED ORDER — SIMETHICONE 80 MG PO CHEW
80.0000 mg | CHEWABLE_TABLET | Freq: Four times a day (QID) | ORAL | Status: AC
  Administered 2022-10-16: 80 mg via ORAL
  Filled 2022-10-16: qty 1

## 2022-10-16 MED ORDER — NALOXEGOL OXALATE 12.5 MG PO TABS: 12.5000 mg | ORAL_TABLET | Freq: Every day | ORAL | 1 refills | Status: DC

## 2022-10-16 MED ORDER — OXYCODONE-ACETAMINOPHEN 5-325 MG PO TABS: 1.0000 | ORAL_TABLET | Freq: Four times a day (QID) | ORAL | 0 refills | Status: DC | PRN

## 2022-10-16 MED ORDER — CLONAZEPAM 0.5 MG PO TABS: 1.5000 mg | ORAL_TABLET | Freq: Two times a day (BID) | ORAL | 0 refills | Status: DC | PRN

## 2022-10-16 MED ORDER — HYDROMORPHONE HCL 1 MG/ML IJ SOLN
0.5000 mg | Freq: Once | INTRAMUSCULAR | Status: AC
  Administered 2022-10-16: 0.5 mg via INTRAVENOUS
  Filled 2022-10-16: qty 1

## 2022-10-16 NOTE — TOC Transition Note (Signed)
Transition of Care [facility]) - CM/SW Discharge Note   Patient Details  Name: Gloria Aguilar MRN: [id number] Date of Birth: November 27, 1970  [facility]) CM/SW Contact:  Maree Krabbe, LCSW Phone Number: 10/16/2022, 1:31 PM   Clinical Narrative:   Clinical Social Worker facilitated patient discharge including contacting patient family and facility to confirm patient discharge plans.  Clinical information faxed to facility and family agreeable with plan.  CSW arranged ambulance transport via [facility] to [facility] (room A9) .  RN to call [phone] Harl Favor RN) for report prior to discharge.    Final next level of care: Skilled Nursing Facility Barriers to Discharge: No Barriers Identified   Patient Goals and CMS Choice        Discharge Placement PASRR number recieved: 10/14/22 Existing PASRR number confirmed : 10/14/22          Patient [facility] bed at:  [facility]) Patient to be transferred to facility by: [facility] Name of family member notified: daughter Patient and family notified of of transfer: 10/14/22  Discharge Plan and Services                                     Social Determinants of Health (SDOH) Interventions     Readmission Risk Interventions     No data to display

## 2022-10-16 NOTE — Discharge Summary (Addendum)
Physician Discharge Summary   Patient: Gloria Aguilar MRN: [id number] DOB: 1971/08/15  Admit date:     10/09/2022  Discharge date: 10/16/22  Discharge Physician: Pennie Banter   PCP: Joana Reamer, DO   PM ADDENDUM -- discharge cancelled due to recurrent severe abdominal pain  Recommendations at discharge:   Follow up with Primary Care in 1-2 weeks Repeat CBC, BMP, Mg in 1-2 weeks Monitor pain control and follow up with outpatient prescriber as scheduled Follow up with [facility] orthopedic surgery as scheduled   Discharge Diagnoses: Principal Problem:   Multifocal pneumonia Active Problems:   Lower respiratory tract infection due to COVID-19 virus   Constipation   Sinus tachycardia   Traumatic brain injury (HCC)   Depression with anxiety   Obesity with body mass index (BMI) of 30.0 to 39.9   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  Was having left knee pain so x-ray was done which did not show any acute pathology.  Off antibiotics 11/28: Can go back to [facility] after COVID isolation completed on 11/30 per TOC.  Patient medically stable, palliative care consult  11/30: off Covid isolation.  Discharge cancelled due to abdominal pain. Xray showed extensive stool burden throughout colon.  Bowel regimen increased with suppository and enema ordered.  12/1: HR"s increased overnight and remained elevated.  Ruled out DVT/PE.  Chest xray with ?infection vs atelectasis.  Pt requiring oxygen again.   Discharge again deferred and started on empiric antibiotics. Resumed cardiac monitoring, remains sinus rhythm.  12/2: enema given with large BM result. No further abdominal pain. Off oxygen.  HR mildly elevated but improved and likely due to pain.  Patient is medically stable for discharge to SNF/rehab today.  Daughter was updated and agreeable.  Adding Movantik for opioid-induced constipation.   Assessment and Plan: * Multifocal pneumonia Patient presenting with 1 to 2-day history of worsening shortness of breath with multifocal pneumonia seen on CTA.  Given recent hospitalization for COVID-19 pneumonia,  Per patient's family and EDP, wheezing noted; history of extensive tobacco use but no diagnosis of COPD.  No wheezing heard on examination.  12/1 -CTA chest negative for PE, no evidence of pneumonia but did show mild atelectasis --Incentive spirometry Per discussion with ID consult, this is likely improving covid pneumonia.  Given patient stable on room air and at her baseline no further treatment needed.  Stopped antibiotics.  Lower respiratory tract infection due to COVID-19 virus .  Sinus tachycardia HR's overnight 11/30-12/1 increased to 110's to 120's.  D-dimer was slightly elevated.  Doppler U/S of BLE's and CTA chest are both negative for DVT or PE.  Possibly related to her musculoskeletal pain.  Abdominal pain has resolved after large BM yesterday. --Telemetry --Monitor for fever, pain, signs of infection 12/2: suspect mild tachycardia related to pain.   Constipation Opioid-induced constipation  Generalized abdominal pain. 11/30 xray showed large stool burden throughout colon. --Will start Movantik at discharge -- Stop daily Miralax, Senna-S BID -- Given Dulcolax suppository 11/30 --Enema was given today with very  large BM resulting --Hold off other stool softeners/laxatives unless no BM after 2-3 days with Movantik  Diarrhea-resolved as of 10/14/2022 Patient's daughter  notes history of diarrhea since contracting COVID-19 and starting Paxlovid.  Most likely drug related.  Now resolved  - C. difficile negative  Traumatic brain injury [facility]) Per patient's daughter, patient is usually able to answer questions appropriately and 1 or 2 words.  She notes that mentation does fluctuate at times though. - Delirium precautions MRI of the brain negative for acute pathology.  Patient seems to be at baseline mental status now  Depression with anxiety Continued on home medications  Obesity with body mass index (BMI) of 30.0 to 39.9 Complicates overall care and prognosis.  Recommend lifestyle modifications including physical activity and diet for weight loss and overall long-term health.   Palliative care by specialist See palliative care consult  Bronchitis Continue as needed cough medication  Musculoskeletal immobility Continue as needed Percocet. Patient follows with [facility] orthopedic surgery. Appears there are plans for removal of left knee hardware and eventual knee replacement. 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  Pt does best answering yes/no questions.         Consultants: None Procedures performed: None  Disposition: Skilled nursing facility Diet recommendation:  Discharge Diet Orders (From admission, onward)     Start     Ordered   10/14/22 0000  Diet - low sodium heart healthy        10/14/22 1157           Cardiac diet DISCHARGE MEDICATION: Allergies as of 10/16/2022       Reactions   Cephalexin Hives   Levetiracetam Itching   Morphine    Wound Dressing Adhesive         Medication List     STOP taking these medications    nirmatrelvir/ritonavir EUA 20 x 150 MG & 10 x 100MG  Tabs Commonly known as: PAXLOVID   polyethylene glycol 17 g packet Commonly known as: MIRALAX / GLYCOLAX   predniSONE 50 MG tablet Commonly known as: DELTASONE   senna 8.6 MG Tabs  tablet Commonly known as: SENOKOT       TAKE these medications    Acetaminophen 500 MG capsule Take 2 tablets by mouth 3 (three) times daily as needed.   albuterol 108 (90 Base) MCG/ACT inhaler Commonly known as: VENTOLIN HFA Inhale 2 puffs into the lungs every 4 (four) hours as needed for wheezing or shortness of breath.   allopurinol 100 MG tablet Commonly known as: ZYLOPRIM Take 100 mg by mouth daily.   aspirin EC 81 MG tablet Take 1 tablet by mouth 3 (three) times daily.   busPIRone 5 MG tablet Commonly known as: BUSPAR Take 5 mg by mouth 3 (three) times daily.   cefdinir 300 MG capsule Commonly known as: OMNICEF Take 1 capsule (300 mg total) by mouth 2 (two) times daily for 3 days.   clonazePAM 0.5 MG tablet Commonly known as: KLONOPIN Take 3 tablets (1.5 mg total) by mouth 2 (two) times daily as needed.   dextromethorphan-guaiFENesin 30-600 MG 12hr tablet Commonly known as: MUCINEX DM Take 1 tablet by mouth 2 (two) times daily as needed for cough.   diphenhydrAMINE 25 MG tablet Commonly known as: BENADRYL Take 25 mg by mouth 2 (two) times daily as needed.   DULoxetine 60 MG capsule Commonly known as: CYMBALTA Take 60 mg by mouth 2 (two) times daily.  gabapentin 300 MG capsule Commonly known as: NEURONTIN Take 600 mg by mouth 3 (three) times daily.   methocarbamol 750 MG tablet Commonly known as: ROBAXIN Take 1 tablet (750 mg total) by mouth every 8 (eight) hours as needed for muscle spasms.   mirtazapine 30 MG tablet Commonly known as: REMERON Take 30 mg by mouth at bedtime.   multivitamin with minerals tablet Take 1 tablet by mouth daily.   naloxegol oxalate 12.5 MG Tabs tablet Commonly known as: MOVANTIK Take 1 tablet (12.5 mg total) by mouth daily.   naproxen sodium 220 MG tablet Commonly known as: ALEVE Take 220 mg by mouth daily as needed.   nicotine 21 mg/24hr patch Commonly known as: NICODERM CQ - dosed in mg/24 hours Place 1 patch  (21 mg total) onto the skin daily.   oxyCODONE-acetaminophen 5-325 MG tablet Commonly known as: PERCOCET/ROXICET Take 1 tablet by mouth every 6 (six) hours as needed for severe pain. What changed:  when to take this reasons to take this   traZODone 150 MG tablet Commonly known as: DESYREL Take 150 mg by mouth at bedtime.        Contact information for after-discharge care     Destination     [facility] .   Service: Skilled Nursing Contact information: [address] [place] [phone]                    Discharge Exam: Filed Weights   10/09/22 1019 10/09/22 2039  Weight: 86 kg 81.6 kg   General exam: awake, alert, no acute distress HEENT: atraumatic, clear conjunctiva, anicteric sclera, moist mucus membranes, hearing grossly normal  Respiratory system: CTAB, no wheezes, rales or rhonchi, normal respiratory effort. Cardiovascular system: normal S1/S2, RRR,  no pedal edema.   Gastrointestinal system: soft, NT, ND, no HSM felt, +bowel sounds. Central nervous system: stable aphasia, A&O Extremities: no edema, moves all Skin: dry, intact, normal temperature Psychiatry: normal mood, congruent affect, judgement and insight appear normal   Condition at discharge: stable  The results of significant diagnostics from this hospitalization (including imaging, microbiology, ancillary and laboratory) are listed below for reference.   Imaging Studies: DG Chest Port 1 View  Result Date: 10/15/2022 CLINICAL DATA:  Hypoxia EXAM: PORTABLE CHEST 1 VIEW COMPARISON:  Chest x-ray dated October 09, 2022 FINDINGS: Cardiac and mediastinal contours are within normal limits. New mild opacity of the mid right lung. No pleural effusion or pneumothorax. IMPRESSION: New mild opacity of the mid right lung, possibly due to atelectasis or infection. Electronically Signed   By: Allegra LaiLeah  Strickland M.D.   On: 10/15/2022 17:20   CT Angio  Chest Pulmonary Embolism (PE) W or WO Contrast  Result Date: 10/15/2022 CLINICAL DATA:  Pulmonary embolism (PE) suspected, high prob positive D-dimer, new O2 need EXAM: CT ANGIOGRAPHY CHEST WITH CONTRAST TECHNIQUE: Multidetector CT imaging of the chest was performed using the standard protocol during bolus administration of intravenous contrast. Multiplanar CT image reconstructions and MIPs were obtained to evaluate the vascular anatomy. RADIATION DOSE REDUCTION: This exam was performed according to the departmental dose-optimization program which includes automated exposure control, adjustment of the mA and/or kV according to patient size and/or use of iterative reconstruction technique. CONTRAST:  75mL OMNIPAQUE IOHEXOL 350 MG/ML SOLN COMPARISON:  Chest XR and CTA PE, 10/09/2022. FINDINGS: Suboptimal evaluation, secondary to motion degradation. Cardiovascular: Satisfactory opacification of the pulmonary arteries to the segmental level. No segmental or larger pulmonary embolus.  Conventional 3-sided LEFT aortic arch. No atherosclerosis or hemodynamic stenosis at the origins of the great vessels. Normal heart size. No pericardial effusion. Mediastinum/Nodes: No enlarged mediastinal, hilar, or axillary lymph nodes. Thyroid gland, trachea, and esophagus demonstrate no significant findings. Lungs/Pleura: Low lung volumes with mild basilar atelectasis. No additional focal consolidation, suspicious pulmonary nodule or mass. No pleural effusion or pneumothorax. Upper Abdomen: No acute abnormality. Dependent layering gallstones within a nondistended gallbladder. Musculoskeletal: Bilateral subpectoral breast implant augmentation. No acute chest wall abnormality. No significant osseous findings. Review of the MIP images confirms the above findings. IMPRESSION: Suboptimal evaluation, within these constraints; 1.  No segmental or larger pulmonary embolus. 2. Pulmonary hypoinflation with mild atelectasis. 3. Cholelithiasis. No  additional CT evidence to suggest acute cholecystitis. Additional incidental, chronic and senescent findings as above. Electronically Signed   By: Roanna Banning M.D.   On: 10/15/2022 12:51   US Venous Img Lower Bilateral (DVT)  Result Date: 10/15/2022 CLINICAL DATA:  51 year old with positive D-dimer. EXAM: BILATERAL LOWER EXTREMITY VENOUS DOPPLER ULTRASOUND TECHNIQUE: Gray-scale sonography with graded compression, as well as color Doppler and duplex ultrasound were performed to evaluate the lower extremity deep venous systems from the level of the common femoral vein and including the common femoral, femoral, profunda femoral, popliteal and calf veins including the posterior tibial, peroneal and gastrocnemius veins when visible. The superficial great saphenous vein was also interrogated. Spectral Doppler was utilized to evaluate flow at rest and with distal augmentation maneuvers in the common femoral, femoral and popliteal veins. COMPARISON:  10/03/2022 FINDINGS: RIGHT LOWER EXTREMITY Common Femoral Vein: No evidence of thrombus. Normal compressibility, respiratory phasicity and response to augmentation. Saphenofemoral Junction: No evidence of thrombus. Normal compressibility and flow on color Doppler imaging. Profunda Femoral Vein: No evidence of thrombus. Normal compressibility and flow on color Doppler imaging. Femoral Vein: No evidence of thrombus. Normal compressibility, respiratory phasicity and response to augmentation. Popliteal Vein: No evidence of thrombus. Normal compressibility, respiratory phasicity and response to augmentation. Calf Veins: Visualized right deep calf veins are patent without thrombus. Superficial Great Saphenous Vein: No evidence of thrombus. Normal compressibility. Other Findings:  None. LEFT LOWER EXTREMITY Common Femoral Vein: No evidence of thrombus. Normal compressibility, respiratory phasicity and response to augmentation. Saphenofemoral Junction: No evidence of thrombus.  Normal compressibility and flow on color Doppler imaging. Profunda Femoral Vein: No evidence of thrombus. Normal compressibility and flow on color Doppler imaging. Femoral Vein: No evidence of thrombus. Normal compressibility, respiratory phasicity and response to augmentation. Popliteal Vein: No evidence of thrombus. Normal compressibility, respiratory phasicity and response to augmentation. Calf Veins: Visualized left deep calf veins are patent without thrombus. Superficial Great Saphenous Vein: No evidence of thrombus. Normal compressibility. Other Findings:  None. IMPRESSION: No evidence of deep venous thrombosis in either lower extremity. Electronically Signed   By: Richarda Overlie M.D.   On: 10/15/2022 12:33   DG Abd 1 View  Result Date: 10/14/2022 CLINICAL DATA:  Abdominal pain. EXAM: ABDOMEN - 1 VIEW COMPARISON:  None Available. FINDINGS: Fairly large amount of stool throughout the:. No dilated large or small bowel loops are identified. No evidence of free intraperitoneal air. No evidence of renal or ureteral calculi. No acute-appearing osseous abnormality. Fixation hardware at both hips. IMPRESSION: 1. Fairly large amount of stool throughout the colon, suggesting constipation. 2. Nonobstructive bowel gas pattern. Electronically Signed   By: Bary Richard M.D.   On: 10/14/2022 13:17   DG Knee Complete 4 Views Left  Result Date: 10/11/2022 CLINICAL DATA:  Left knee pain EXAM: LEFT KNEE - COMPLETE 4+ VIEW COMPARISON:  radiographs 07/17/2020 FINDINGS: Extensive changes from previous IM rod and screw fixation of the distal left femur and proximal left tibia. No radiographic evidence of loosening. Definite acute fracture. Lateral subluxation of the tibia in relation to the femur measuring approximately 8 mm. Advanced degenerative arthritis about the left knee with complete medial compartment narrowing with bone-on-bone contact and subchondral sclerosis. No knee joint effusion. IMPRESSION: Extensive  postoperative changes about the left knee. No definite acute finding. Advanced degenerative arthritis with complete loss of joint space in the medial compartment. Electronically Signed   By: Minerva Fester M.D.   On: 10/11/2022 20:16   MR BRAIN WO CONTRAST  Result Date: 10/10/2022 CLINICAL DATA:  History of CVA with aphasia and right-sided weakness presents with shortness of breath and altered mental status. EXAM: MRI HEAD WITHOUT CONTRAST TECHNIQUE: Multiplanar, multiecho pulse sequences of the brain and surrounding structures were obtained without intravenous contrast. COMPARISON:  CT head 10/03/2022 FINDINGS: Brain: There is no acute intracranial hemorrhage, extra-axial fluid collection, or acute infarct. There is a large left MCA territorial infarct with associated encephalomalacia, gliosis, ex vacuo dilatation of the left lateral ventricle, and small amount of chronic blood products. Additional smaller cortical infarcts are seen in the right cerebral hemisphere. Background parenchymal volume otherwise appears normal. The ventricles are otherwise normal in size. There is no mass lesion.  There is no mass effect or midline shift. Vascular: Normal flow voids. Skull and upper cervical spine: Normal marrow signal. Sinuses/Orbits: The paranasal sinuses are clear. The globes and orbits are unremarkable. Other: None. IMPRESSION: 1. No acute intracranial pathology. 2. Unchanged large remote left MCA territorial infarct and smaller remote infarcts in the right cerebral hemisphere. Electronically Signed   By: Lesia Hausen M.D.   On: 10/10/2022 12:29   CT Angio Chest PE W and/or Wo Contrast  Result Date: 10/09/2022 CLINICAL DATA:  Recent COVID, shortness of breath EXAM: CT ANGIOGRAPHY CHEST WITH CONTRAST TECHNIQUE: Multidetector CT imaging of the chest was performed using the standard protocol during bolus administration of intravenous contrast. Multiplanar CT image reconstructions and MIPs were obtained to  evaluate the vascular anatomy. RADIATION DOSE REDUCTION: This exam was performed according to the departmental dose-optimization program which includes automated exposure control, adjustment of the mA and/or kV according to patient size and/or use of iterative reconstruction technique. CONTRAST:  75mL OMNIPAQUE IOHEXOL 350 MG/ML SOLN COMPARISON:  Chest x-ray 10/09/2022 FINDINGS: Cardiovascular: Satisfactory opacification of the pulmonary arteries to the segmental level. No evidence of pulmonary embolism. Nonaneurysmal aorta. No dissection is seen. Upper normal cardiac size. Trace pericardial effusion Mediastinum/Nodes: Midline trachea. No thyroid mass. Borderline right paratracheal nodes measuring up to 10 mm. Esophagus within normal limits. Lungs/Pleura: Heterogeneous bilateral ground-glass densities and patchy consolidations, worse in the right upper lobe, consistent with bilateral pneumonia. No pleural effusion or pneumothorax. Upper Abdomen: No acute abnormality. Musculoskeletal: Bilateral breast implants with mild capsular calcification. No acute osseous abnormality Review of the MIP images confirms the above findings. IMPRESSION: 1. Negative for acute pulmonary embolus or aortic dissection. 2. Heterogeneous bilateral ground-glass densities and patchy consolidations, worst in the right upper lobe, consistent with bilateral pneumonia Electronically Signed   By: Jasmine Pang M.D.   On: 10/09/2022 17:52   DG Chest Port 1 View  Result Date: 10/09/2022 CLINICAL DATA:  Shortness of breath. EXAM: PORTABLE CHEST 1 VIEW COMPARISON:  October 03, 2022. FINDINGS: Stable cardiomediastinal silhouette. Mild central pulmonary vascular congestion is  noted with possible bilateral pulmonary edema. Bony thorax is unremarkable. IMPRESSION: Mild central pulmonary vascular congestion is noted with possible bilateral pulmonary edema. Electronically Signed   By: Lupita Raider M.D.   On: 10/09/2022 11:24   US Venous Img Lower  Bilateral (DVT)  Result Date: 10/03/2022 CLINICAL DATA:  Cough, shortness of breath and fever. Fall. Question DVT. EXAM: BILATERAL LOWER EXTREMITY VENOUS DOPPLER ULTRASOUND TECHNIQUE: Gray-scale sonography with graded compression, as well as color Doppler and duplex ultrasound were performed to evaluate the lower extremity deep venous systems from the level of the common femoral vein and including the common femoral, femoral, profunda femoral, popliteal and calf veins including the posterior tibial, peroneal and gastrocnemius veins when visible. The superficial great saphenous vein was also interrogated. Spectral Doppler was utilized to evaluate flow at rest and with distal augmentation maneuvers in the common femoral, femoral and popliteal veins. COMPARISON:  None Available. FINDINGS: RIGHT LOWER EXTREMITY Common Femoral Vein: No evidence of thrombus. Normal compressibility. Patient refused color and Doppler imaging. Saphenofemoral Junction: No evidence of thrombus. Normal compressibility. Patient refused color and Doppler imaging. Profunda Femoral Vein: No evidence of thrombus. Normal compressibility. Patient refused color and Doppler imaging. Femoral Vein: No evidence of thrombus. Normal compressibility. Patient refused color and Doppler imaging. Popliteal Vein: Not imaged.  Patient refused further imaging. Calf Veins: Not imaged.  Patient refused further imaging Superficial Great Saphenous Vein: Not imaged. Patient refused further imaging Venous Reflux:  None. Other Findings:  None. LEFT LOWER EXTREMITY Common Femoral Vein: No evidence of thrombus. Normal compressibility, respiratory phasicity and response to augmentation. Saphenofemoral Junction: No evidence of thrombus. Normal compressibility and flow on color Doppler imaging. Profunda Femoral Vein: No evidence of thrombus. Normal compressibility and flow on color Doppler imaging. Femoral Vein: No evidence of thrombus. Normal compressibility, respiratory  phasicity and response to augmentation. Popliteal Vein: No evidence of thrombus. Normal compressibility, respiratory phasicity and response to augmentation. Calf Veins: No evidence of thrombus. Normal compressibility and flow on color Doppler imaging. Superficial Great Saphenous Vein: No evidence of thrombus. Normal compressibility. Venous Reflux:  None. Other Findings:  None. IMPRESSION: No evidence of deep venous thrombosis in either lower extremity. Of note, incomplete evaluation was performed in the right lower extremity as described above. Electronically Signed   By: Marin Roberts M.D.   On: 10/03/2022 16:23   CT CERVICAL SPINE WO CONTRAST  Result Date: 10/03/2022 CLINICAL DATA:  Neck trauma, fall EXAM: CT CERVICAL SPINE WITHOUT CONTRAST TECHNIQUE: Multidetector CT imaging of the cervical spine was performed without intravenous contrast. Multiplanar CT image reconstructions were also generated. RADIATION DOSE REDUCTION: This exam was performed according to the departmental dose-optimization program which includes automated exposure control, adjustment of the mA and/or kV according to patient size and/or use of iterative reconstruction technique. COMPARISON:  03/27/2020 FINDINGS: Alignment: Facet joints are aligned without dislocation or traumatic listhesis. Dens and lateral masses are aligned. Straightening with smooth reversal of the cervical lordosis. Skull base and vertebrae: No acute fracture. No primary bone lesion or focal pathologic process. Soft tissues and spinal canal: No prevertebral fluid or swelling. No visible canal hematoma. Disc levels:  Degenerative disc disease of C5-6 is similar to prior. Upper chest: Hypoventilatory changes within the included lung fields. Other: None. IMPRESSION: 1. No acute fracture or traumatic listhesis of the cervical spine. 2. Straightening with smooth reversal of the cervical lordosis, which may be secondary to positioning or muscle spasm. Electronically  Signed   By: Duanne Guess D.O.  On: 10/03/2022 11:39   CT HEAD WO CONTRAST ( )  Result Date: 10/03/2022 CLINICAL DATA:  Trauma EXAM: CT HEAD WITHOUT CONTRAST TECHNIQUE: Contiguous axial images were obtained from the base of the skull through the vertex without intravenous contrast. RADIATION DOSE REDUCTION: This exam was performed according to the departmental dose-optimization program which includes automated exposure control, adjustment of the mA and/or kV according to patient size and/or use of iterative reconstruction technique. COMPARISON:  CT head 03/27/20 FINDINGS: Brain: Evolving large left MCA territory infarct involving large portions of the left frontal, parietal, and anterior temporal lobes. No evidence of hemorrhage. No hydrocephalus. No extra-axial fluid collection. There is mild ex vacuo dilatation of the left lateral ventricular system. Vascular: No hyperdense vessel or unexpected calcification. Skull: Prior maxillary and mandibular fixation changes. Sinuses/Orbits: Mild mucosal thickening bilateral maxillary sinuses. There is a chronic fracture of the lateral wall of the left maxillary sinus. Impacted cerumen in bilateral external auditory canals. Other: None. IMPRESSION: Evolving large left MCA territory infarct. No evidence of hemorrhage or acute intracranial injury. Electronically Signed   By: Lorenza Cambridge M.D.   On: 10/03/2022 11:38   CT ABDOMEN PELVIS W CONTRAST  Result Date: 10/03/2022 CLINICAL DATA:  Abdominal pain, acute, nonlocalized EXAM: CT ABDOMEN AND PELVIS WITH CONTRAST TECHNIQUE: Multidetector CT imaging of the abdomen and pelvis was performed using the standard protocol following bolus administration of intravenous contrast. RADIATION DOSE REDUCTION: This exam was performed according to the departmental dose-optimization program which includes automated exposure control, adjustment of the mA and/or kV according to patient size and/or use of iterative reconstruction  technique. CONTRAST:  OMNIPAQUE IOHEXOL 300 MG/ML  SOLN COMPARISON:  08/30/2014 FINDINGS: Lower chest: Dependent atelectasis. Symmetric inflation of the covered breast implants. Hepatobiliary: There could be hepatic steatosis but limited by post contrast timing.Cholelithiasis. No evidence of biliary inflammation. Pancreas: Unremarkable. Spleen: Unremarkable. Adrenals/Urinary Tract: Negative adrenals. No hydronephrosis or stone. Unremarkable bladder. Stomach/Bowel:  No obstruction. No appendicitis. Vascular/Lymphatic: No acute vascular abnormality. No mass or adenopathy. Reproductive:No pathologic findings. Other: No ascites or pneumoperitoneum. Musculoskeletal: No acute abnormalities. Femoral neck fixation on both sides. IMPRESSION: 1. No acute intra-abdominal finding. 2. Cholelithiasis and suspected hepatic steatosis. 3. Atelectasis at the lung bases. Electronically Signed   By: Tiburcio Pea M.D.   On: 10/03/2022 06:29   DG Chest Port 1 View  Result Date: 10/03/2022 CLINICAL DATA:  Fall from bed.  Questionable sepsis EXAM: PORTABLE CHEST 1 VIEW COMPARISON:  07/17/2020 FINDINGS: Limited low volume chest with diffuse interstitial crowding. Linear atelectasis on the right. Cardiopericardial enlargement. No visible effusion or pneumothorax. Extensive artifact from EKG leads. IMPRESSION: Very low volume chest with atelectasis.  No convincing pneumonia. Electronically Signed   By: Tiburcio Pea M.D.   On: 10/03/2022 04:42    Microbiology: Results for orders placed or performed during the hospital encounter of 10/09/22  MRSA Next Gen by PCR, Nasal     Status: None   Collection Time: 10/09/22  6:30 PM   Specimen: Nasal Mucosa; Nasal Swab  Result Value Ref Range Status   MRSA by PCR Next Gen NOT DETECTED NOT DETECTED Final    Comment: (NOTE) The GeneXpert MRSA Assay (FDA approved for NASAL specimens only), is one component of a comprehensive MRSA colonization surveillance program. It is not  intended to diagnose MRSA infection nor to guide or monitor treatment for MRSA infections. Test performance is not FDA approved in patients less than 36 years old. Performed at [facility], [address] [place], [place] [address]   Blood culture (routine x 2)     Status: None   Collection Time: 10/09/22  7:57 PM   Specimen: BLOOD  Result Value Ref Range Status   Specimen Description BLOOD BLOOD RIGHT FOREARM  Final   Special Requests   Final    BOTTLES DRAWN AEROBIC AND ANAEROBIC Blood Culture results may not be optimal due to an excessive volume of blood received in culture bottles   Culture   Final    NO GROWTH 5 DAYS Performed at [facility], [address] [place], [place] [address]    Report Status 10/14/2022 FINAL  Final  Culture, blood (Routine X 2) w Reflex to ID Panel     Status: None   Collection Time: 10/10/22  6:11 AM   Specimen: BLOOD  Result Value Ref Range Status   Specimen Description BLOOD LEFT HAND  Final   Special Requests   Final    BOTTLES DRAWN AEROBIC AND ANAEROBIC Blood Culture adequate volume   Culture   Final    NO GROWTH 5 DAYS Performed at [facility], [address] [place], [place] [address]    Report Status 10/15/2022 FINAL  Final  Respiratory (~20 pathogens) panel by PCR     Status: None   Collection Time: 10/11/22 11:57 AM   Specimen: Nasopharyngeal Swab; Respiratory  Result Value Ref Range Status   Adenovirus NOT DETECTED NOT DETECTED Final   Coronavirus 229E NOT DETECTED NOT DETECTED Final    Comment: (NOTE) The Coronavirus on the Respiratory Panel, DOES NOT test for the novel  Coronavirus (2019 nCoV)    Coronavirus HKU1 NOT DETECTED NOT DETECTED Final   Coronavirus NL63 NOT DETECTED NOT DETECTED Final   Coronavirus OC43 NOT DETECTED NOT DETECTED Final   Metapneumovirus NOT DETECTED NOT DETECTED Final   Rhinovirus / Enterovirus NOT DETECTED NOT DETECTED Final   Influenza A NOT DETECTED NOT DETECTED  Final   Influenza B NOT DETECTED NOT DETECTED Final   Parainfluenza Virus 1 NOT DETECTED NOT DETECTED Final   Parainfluenza Virus 2 NOT DETECTED NOT DETECTED Final   Parainfluenza Virus 3 NOT DETECTED NOT DETECTED Final   Parainfluenza Virus 4 NOT DETECTED NOT DETECTED Final   Respiratory Syncytial Virus NOT DETECTED NOT DETECTED Final   Bordetella pertussis NOT DETECTED NOT DETECTED Final   Bordetella Parapertussis NOT DETECTED NOT DETECTED Final   Chlamydophila pneumoniae NOT DETECTED NOT DETECTED Final   Mycoplasma pneumoniae NOT DETECTED NOT DETECTED Final    Comment: Performed at [facility], [address], [place], [place] [address]    Labs: CBC: Recent Labs  Lab 10/10/22 0559 10/11/22 0533 10/15/22 0057 10/16/22 0542  WBC 14.9* 9.2 11.4* 7.1  NEUTROABS  --   --  7.7  --   HGB 12.1 11.4* 13.7 13.3  HCT 33.8* 31.9* 39.4 37.8  MCV 86.4 87.2 88.3 87.7  PLT 272 258 292 285   Basic Metabolic Panel: Recent Labs  Lab 10/10/22 0611 10/11/22 0533 10/15/22 0057  NA 139 138 136  K 4.0 4.2 4.5  CL 106 106 101  CO2 26 25 26   GLUCOSE 140* 91 132*  BUN 17 28* 21*  CREATININE 0.53 0.62 0.75  CALCIUM 8.3* 8.4* 9.2  MG  --   --  2.1   Liver Function Tests: Recent Labs  Lab 10/11/22 0533 10/15/22 0057  AST 17 29  ALT 19 38  ALKPHOS 89 121  BILITOT 0.6 0.7  PROT 6.3* 7.6  ALBUMIN 2.9* 3.7   CBG: No results for input(s): "GLUCAP" in the last 168 hours.  Discharge time spent: greater than 30 minutes.  Signed: Pennie Banter, DO [facility] 10/16/2022

## 2022-10-17 DIAGNOSIS — R4701 Aphasia: Secondary | ICD-10-CM | POA: Diagnosis not present

## 2022-10-17 DIAGNOSIS — R1084 Generalized abdominal pain: Secondary | ICD-10-CM | POA: Diagnosis not present

## 2022-10-17 DIAGNOSIS — K5903 Drug induced constipation: Secondary | ICD-10-CM | POA: Diagnosis not present

## 2022-10-17 DIAGNOSIS — S098XXA Other specified injuries of head, initial encounter: Secondary | ICD-10-CM | POA: Diagnosis not present

## 2022-10-17 LAB — BASIC METABOLIC PANEL
Anion gap: 6 (ref 5–15)
BUN: 23 mg/dL — ABNORMAL HIGH (ref 6–20)
CO2: 25 mmol/L (ref 22–32)
Calcium: 8.9 mg/dL (ref 8.9–10.3)
Chloride: 106 mmol/L (ref 98–111)
Creatinine, Ser: 0.63 mg/dL (ref 0.44–1.00)
GFR, Estimated: >60 mL/min (ref >60)
Glucose, Bld: 118 mg/dL — ABNORMAL HIGH (ref 70–99)
Potassium: 4 mmol/L (ref 3.5–5.1)
Sodium: 137 mmol/L (ref 135–145)

## 2022-10-17 LAB — CBC
HCT: 35.5 % — ABNORMAL LOW (ref 36.0–46.0)
Hemoglobin: 12.5 g/dL (ref 12.0–15.0)
MCH: 31 pg (ref 26.0–34.0)
MCHC: 35.2 g/dL (ref 30.0–36.0)
MCV: 88.1 fL (ref 80.0–100.0)
Platelets: 253 10*3/uL (ref 150–400)
RBC: 4.03 MIL/uL (ref 3.87–5.11)
RDW: 14.1 % (ref 11.5–15.5)
WBC: 6 10*3/uL (ref 4.0–10.5)
nRBC: 0 % (ref 0.0–0.2)

## 2022-10-17 LAB — TSH: TSH: 1.562 u[IU]/mL (ref 0.350–4.500)

## 2022-10-17 MED ORDER — OXYCODONE HCL 5 MG PO TABS
5.0000 mg | ORAL_TABLET | Freq: Three times a day (TID) | ORAL | Status: DC | PRN
  Administered 2022-10-17 – 2022-10-18 (×2): 5 mg via ORAL
  Filled 2022-10-17 (×2): qty 1

## 2022-10-17 MED ORDER — HYDROCORTISONE ACETATE 25 MG RE SUPP
25.0000 mg | Freq: Two times a day (BID) | RECTAL | Status: DC
  Filled 2022-10-17 (×3): qty 1

## 2022-10-17 MED ORDER — METOCLOPRAMIDE HCL 10 MG PO TABS
10.0000 mg | ORAL_TABLET | Freq: Three times a day (TID) | ORAL | Status: DC
  Administered 2022-10-17 – 2022-10-18 (×3): 10 mg via ORAL
  Filled 2022-10-17 (×5): qty 1

## 2022-10-17 MED ORDER — NALOXEGOL OXALATE 25 MG PO TABS
25.0000 mg | ORAL_TABLET | Freq: Every day | ORAL | Status: DC
  Administered 2022-10-17 – 2022-10-18 (×2): 25 mg via ORAL
  Filled 2022-10-17 (×2): qty 1

## 2022-10-17 MED ORDER — SIMETHICONE 80 MG PO CHEW
80.0000 mg | CHEWABLE_TABLET | Freq: Four times a day (QID) | ORAL | Status: DC
  Administered 2022-10-17 – 2022-10-18 (×5): 80 mg via ORAL
  Filled 2022-10-17 (×7): qty 1

## 2022-10-17 MED ORDER — ACETAMINOPHEN 500 MG PO TABS
1000.0000 mg | ORAL_TABLET | Freq: Three times a day (TID) | ORAL | Status: DC
  Administered 2022-10-17 – 2022-10-18 (×4): 1000 mg via ORAL
  Filled 2022-10-17 (×4): qty 2

## 2022-10-17 MED ORDER — DICYCLOMINE HCL 10 MG PO CAPS
10.0000 mg | ORAL_CAPSULE | Freq: Three times a day (TID) | ORAL | Status: DC
  Administered 2022-10-17 – 2022-10-18 (×5): 10 mg via ORAL
  Filled 2022-10-17 (×6): qty 1

## 2022-10-17 MED ORDER — PANTOPRAZOLE SODIUM 40 MG IV SOLR
40.0000 mg | Freq: Two times a day (BID) | INTRAVENOUS | Status: DC
  Administered 2022-10-17: 40 mg via INTRAVENOUS
  Filled 2022-10-17: qty 10

## 2022-10-17 MED ORDER — TRAMADOL HCL 50 MG PO TABS: 50.0000 mg | ORAL_TABLET | Freq: Four times a day (QID) | ORAL | Status: DC | PRN

## 2022-10-17 NOTE — Assessment & Plan Note (Signed)
Persistent despite resolution of stool impaction. Repeat xray on 12/2 had question of rectal foreign body but CT abdomen pelvis was negative for that or other acute findings to explain ongoing pain. --Appreciate input from GI, surgery --Started on Bentyl trial --? Post-covid gastroparesis, start Reglan trial --Movantik for OIC --Limit opioids as much as possible

## 2022-10-17 NOTE — Progress Notes (Addendum)
Progress Note   Patient: Gloria Aguilar QTM:[id number] DOB: [birth year] DOA: 10/09/2022     7 DOS: the patient was seen and examined on 10/17/2022   Brief hospital course: 52 y.o. 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  Was having left knee pain so x-ray was done which did not show any acute pathology.  Off antibiotics 11/28: Can go back to [facility] after COVID isolation completed on 11/30 per TOC.  Patient medically stable, palliative care consult  11/30: off Covid isolation.  Discharge cancelled due to abdominal pain. Xray showed extensive stool burden throughout colon.  Bowel regimen increased with suppository and enema ordered.  12/1: HR"s increased overnight and remained elevated.  Ruled out DVT/PE.  Chest xray with ?infection vs atelectasis.  Pt requiring oxygen again.  Discharge again deferred and started on empiric antibiotics. Resumed cardiac monitoring, remains sinus rhythm.  12/2: enema given with large BM result. No further abdominal pain. Off oxygen.  HR mildly elevated but improved and likely due to pain.  Patient was planned for discharge to SNF/rehab, but then developed recurrent severe generalized abdominal pain.  Further evaluation including imaging and consults to surgery and GI.  Added Movantik for opioid-induced constipation.    Assessment and Plan: * Multifocal pneumonia Patient presenting with 1 to 2-day history of worsening shortness of breath with multifocal pneumonia seen on CTA.  Given recent hospitalization for COVID-19 pneumonia,  Per patient's family and EDP, wheezing noted; history of extensive tobacco use but no diagnosis of COPD.  No wheezing heard on  examination.  12/1 -CTA chest negative for PE, no evidence of pneumonia but did show mild atelectasis --Incentive spirometry Per discussion with ID consult, this is likely improving covid pneumonia.  Given patient stable on room air and at her baseline no further treatment needed.  Stopped antibiotics.  Lower respiratory tract infection due to COVID-19 virus .  Sinus tachycardia HR's overnight 11/30-12/1 increased to 110's to 120's.  D-dimer was slightly elevated.  Doppler U/S of BLE's and CTA chest are both negative for DVT or PE.  Possibly related to her musculoskeletal pain.  Abdominal pain has resolved after large BM yesterday. --Telemetry --Monitor for fever, pain, signs of infection 12/2: suspect mild tachycardia related to pain.   Constipation Opioid-induced constipation  Generalized abdominal pain. 11/30 xray showed large stool burden throughout colon.  Prior diarrhea was likely overflow with stool impaction underlying. --Started on Movantik  -- Stop daily Miralax, Senna-S BID -- Given Dulcolax suppository 11/30 --Enema was given 12/2 with very large BM resulting --Hold off other stool softeners/laxatives unless no BM after 2-3 days with Movantik --Can resume Miralax, Metamucil if Movantik alone not effective  Diarrhea-resolved as of 10/14/2022 Patient's daughter notes history of diarrhea since contracting COVID-19 and starting Paxlovid.  Most likely drug related.  Now resolved  - C. difficile negative  Traumatic brain injury [facility]) Per patient's daughter, patient is usually able to answer questions appropriately and 1 or 2 words.  She notes that mentation does fluctuate at times though. - Delirium precautions MRI of the brain negative for acute pathology.  Patient seems to be at baseline mental status now  Depression with anxiety Continued on home medications  Obesity with body mass index (BMI) of 30.0 to  39.9 Complicates overall care and prognosis.  Recommend  lifestyle modifications including physical activity and diet for weight loss and overall long-term health.   Generalized abdominal pain Persistent despite resolution of stool impaction. Repeat xray on 12/2 had question of rectal foreign body but CT abdomen pelvis was negative for that or other acute findings to explain ongoing pain. --Appreciate input from GI, surgery --Started on Bentyl trial --? Post-covid gastroparesis, start Reglan trial --Movantik for OIC --Limit opioids as much as possible  Palliative care by specialist See palliative care consult  Bronchitis Continue as needed cough medication  Musculoskeletal immobility Continue as needed Percocet. Patient follows with [facility] orthopedic surgery. Appears there are plans for removal of left knee hardware and eventual knee replacement. 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  Pt does best answering yes/no questions.        Subjective: Pt seen awake and seated edge of bed.  Reports ongoing abdominal pain.  Also reports intermittent nausea and vomiting this AM, but nursing staff unable to confirm this.  She continues to state "need to go", clarified with patient she means get out of the hospital.   Physical Exam: Vitals:   10/17/22 0053 10/17/22 0528 10/17/22 1037 10/17/22 1207  BP: 90/69 117/74 120/78 90/60  Pulse: (!) 116 (!) 114 (!) 109 (!) 106  Resp: 20 19 17 17   Temp: 97.8 F (36.6 C) 98 F (36.7 C) (!) 97.5 F (36.4 C) 97.9 F (36.6 C)  TempSrc:      SpO2: 91% 96% 94% 96%  Weight:      Height:       General exam: awake, alert, no acute distress HEENT: moist mucus membranes, hearing grossly normal  Respiratory system: CTAB, no wheezes, rales or rhonchi, normal respiratory effort. Cardiovascular system: normal S1/S2, RRR, no pedal edema.   Gastrointestinal system: soft, nontender, nondistended, no guarding or rebound, +bowel sounds Central nervous system:  chronic aphasia stable.  Extremities: moves all, left leg surgical scars and stable tenderness no warmth erythema or swelling Skin: dry, intact, normal temperature Psychiatry: anxious mood, congruent affect, judgement and insight appear normal   Data Reviewed:  Notable labs -- glucose 118, BUN 23, TSH normal.  CT abdomen/pelvis yesterday evening - no foreign body in rectum, diverticulosis without acute diverticulitis, cholelithiasis without acute cholecysitis - no acute findings to explain abodminal pain  Family Communication: updated daughter by phone this afternoon  Disposition: Status is: Inpatient Remains inpatient appropriate because: ongoing evaluation of persistent and uncontrolled abdominal pain with trials of new medications started   Planned Discharge Destination: Skilled nursing facility    Time spent: 45 minutes  Author: , DO 10/17/2022 2:34 PM  For on call review [email or web address].
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
## 2022-10-18 DIAGNOSIS — J189 Pneumonia, unspecified organism: Secondary | ICD-10-CM | POA: Diagnosis not present

## 2022-10-18 MED ORDER — POLYETHYLENE GLYCOL 3350 17 G PO PACK: 17.0000 g | PACK | Freq: Every day | ORAL | 0 refills | Status: AC | PRN

## 2022-10-18 MED ORDER — TRAMADOL HCL 50 MG PO TABS: 50.0000 mg | ORAL_TABLET | Freq: Four times a day (QID) | ORAL | 0 refills | Status: DC | PRN

## 2022-10-18 MED ORDER — DICYCLOMINE HCL 10 MG PO CAPS: 10.0000 mg | ORAL_CAPSULE | Freq: Three times a day (TID) | ORAL | Status: AC

## 2022-10-18 MED ORDER — CLONAZEPAM 0.5 MG PO TABS: 1.5000 mg | ORAL_TABLET | Freq: Two times a day (BID) | ORAL | 0 refills | Status: DC | PRN

## 2022-10-18 MED ORDER — OXYCODONE HCL 5 MG PO TABS: 5.0000 mg | ORAL_TABLET | Freq: Three times a day (TID) | ORAL | 0 refills | Status: DC | PRN

## 2022-10-18 MED ORDER — ACETAMINOPHEN 500 MG PO TABS: 1000.0000 mg | ORAL_TABLET | Freq: Three times a day (TID) | ORAL | 0 refills | Status: AC

## 2022-10-18 MED ORDER — SIMETHICONE 80 MG PO CHEW: 80.0000 mg | CHEWABLE_TABLET | Freq: Four times a day (QID) | ORAL | 0 refills | Status: DC | PRN

## 2022-10-18 MED ORDER — NALOXEGOL OXALATE 25 MG PO TABS: 25.0000 mg | ORAL_TABLET | Freq: Every day | ORAL | Status: AC

## 2022-10-18 NOTE — Discharge Summary (Addendum)
Physician Discharge Summary   Patient: Gloria Aguilar MRN: [id number] DOB: 09-May-1971  Admit date:     10/09/2022  Discharge date: 10/18/22  Discharge Physician: Pennie Banter   PCP: Joana Reamer, DO   Recommendations at discharge:   Follow up with Primary Care in 1-2 weeks Repeat CBC, BMP, Mg in 1-2 weeks Monitor pain control and follow up with outpatient prescriber as scheduled Follow up with [facility] surgery as scheduled  Follow up with Gastroenterology for evaluation if abdominal pain persistent.  Bentyl was added for history of IBS.  Reglan was tried without benefit, low suspicion for gastroparesis given no N/V after meals and no emesis of undigested foods. Due to severe constipation requiring enemas, Movantik was started for OIC.  Additional stool softeners may be added if still not having adequate BM's.  Monitor closely and use enema or suppository if needed.    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  Was having left knee pain so x-ray was done which did not show any acute pathology.  Off antibiotics 11/28: Can go back to [facility] after COVID isolation completed  on 11/30 per TOC.  Patient medically stable, palliative care consult  11/30: off Covid isolation.  Discharge cancelled due to abdominal pain. Xray showed extensive stool burden throughout colon.  Bowel regimen increased with suppository and enema ordered.  12/1: HR"s increased overnight and remained elevated.  Ruled out DVT/PE.  Chest xray with ?infection vs atelectasis.  Pt requiring oxygen again.  Discharge again deferred and started on empiric antibiotics. Resumed cardiac monitoring, remains sinus rhythm.  12/2: enema given with large BM result. No further abdominal pain. Off oxygen.  HR mildly elevated but improved and likely due to pain.  Patient was planned for discharge to SNF/rehab, but then developed recurrent severe generalized abdominal pain.  Further evaluation including imaging and consults to surgery and GI.  Added Movantik for opioid-induced constipation.   12/4: pt reports abdominal pain somewhat improved. Tolerating meals well.  HR's have also improved, consistent with improvement in pain.  Patient is medically stable and requesting for discharge today.     Assessment and Plan: * Multifocal pneumonia Patient presenting with 1 to 2-day history of worsening shortness of breath with multifocal pneumonia seen on CTA.  Given recent hospitalization for COVID-19 pneumonia,  Per patient's family and EDP, wheezing noted; history of extensive tobacco use but no diagnosis of COPD.  No wheezing heard on examination.  12/1 -CTA chest negative for PE, no evidence of pneumonia but did show mild atelectasis --Incentive spirometry Per discussion with ID consult, this is likely improving covid pneumonia.  Given patient stable on room air and at her baseline no further treatment needed.  Stopped antibiotics.  Lower respiratory tract infection due to COVID-19 virus .  Sinus tachycardia HR's overnight 11/30-12/1 increased to 110's to 120's.  D-dimer was slightly elevated.  Doppler U/S of BLE's  and CTA chest are both negative for DVT or PE.  Possibly related to her musculoskeletal pain.  Abdominal pain has resolved after large BM yesterday. --Telemetry --Monitor for fever, pain, signs of infection 12/2: suspect mild tachycardia related to pain. 12/4: HR's improved today, abdominal pain is better, supports tachycardia due to pain   Constipation Opioid-induced constipation  Generalized abdominal pain. 11/30 xray showed large stool burden throughout colon.  Prior diarrhea was likely overflow with stool impaction underlying. --Started on Movantik  -- Stop daily Miralax, Senna-S BID -- Given Dulcolax suppository 11/30 --Enema was given 12/2 with very large BM resulting --Hold off other stool softeners/laxatives unless no BM after 2-3 days with Movantik --Can resume Miralax, Metamucil if Movantik alone not effective  Diarrhea-resolved as of 10/14/2022 Patient's daughter notes history of diarrhea since contracting COVID-19 and starting Paxlovid.  Most likely drug related.  Now resolved  - C. difficile negative  Traumatic brain injury [facility](HCC) Per patient's daughter, patient is usually able to answer questions appropriately and 1 or 2 words.  She notes that mentation does fluctuate at times though. - Delirium precautions MRI of the brain negative for acute pathology.  Patient seems to be at baseline mental status now  Depression with anxiety Continued on home medications  Obesity with body mass index (BMI) of 30.0 to 39.9 Complicates overall care and prognosis.  Recommend lifestyle modifications including physical activity and diet for weight loss and overall long-term health.   Generalized abdominal pain Persistent despite resolution of stool impaction. Repeat xray on 12/2 had question of rectal foreign body but CT abdomen pelvis was negative for that or other acute findings to explain ongoing pain. --Appreciate input from GI, surgery --Started on Bentyl trial given hx of  IBS --? Post-covid gastroparesis, no improvement with Reglan trial. No history of emesis of undigested foods. --Movantik for OIC --Limit opioids as much as possible --Outpatient GI follow up if persistent  Palliative care by specialist See palliative care consult  Bronchitis Continue as needed cough medication  Musculoskeletal immobility Continue as needed Percocet. Patient follows with [facility]opedic surgery. Appears there are plans for removal of left knee hardware and eventual knee replacement. 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  Pt does best answering yes/no questions.    History of recent Covid-19 infection. No active infection.      Consultants: GI, surgery, palliative care Procedures performed: None  Disposition: Skilled nursing facility Diet recommendation:  Discharge Diet Orders (From admission, onward)     Start     Ordered   10/14/22 0000  Diet - low sodium heart healthy        10/14/22 1157           Cardiac diet DISCHARGE MEDICATION: Allergies as of 10/18/2022       Reactions   Cephalexin Hives   Levetiracetam Itching   Morphine    Wound Dressing Adhesive         Medication List     STOP taking these medications    Acetaminophen 500 MG capsule Replaced by: acetaminophen 500 MG tablet   nirmatrelvir/ritonavir EUA 20 x 150 MG & 10 x 100MG  Tabs Commonly known as: PAXLOVID   oxyCODONE-acetaminophen 5-325 MG tablet Commonly known as: PERCOCET/ROXICET  predniSONE 50 MG tablet Commonly known as: DELTASONE   senna 8.6 MG Tabs tablet Commonly known as: SENOKOT       TAKE these medications    acetaminophen 500 MG tablet Commonly known as: TYLENOL Take 2 tablets (1,000 mg total) by mouth every 8 (eight) hours. Replaces: Acetaminophen 500 MG capsule   albuterol 108 (90 Base) MCG/ACT inhaler Commonly known as: VENTOLIN HFA Inhale 2 puffs into the lungs every 4 (four)  hours as needed for wheezing or shortness of breath.   allopurinol 100 MG tablet Commonly known as: ZYLOPRIM Take 100 mg by mouth daily.   aspirin EC 81 MG tablet Take 1 tablet by mouth 3 (three) times daily.   busPIRone 5 MG tablet Commonly known as: BUSPAR Take 5 mg by mouth 3 (three) times daily.   clonazePAM 0.5 MG tablet Commonly known as: KLONOPIN Take 3 tablets (1.5 mg total) by mouth 2 (two) times daily as needed for anxiety. What changed: reasons to take this   dextromethorphan-guaiFENesin 30-600 MG 12hr tablet Commonly known as: MUCINEX DM Take 1 tablet by mouth 2 (two) times daily as needed for cough.   dicyclomine 10 MG capsule Commonly known as: BENTYL Take 1 capsule (10 mg total) by mouth 4 (four) times daily -  before meals and at bedtime.   diphenhydrAMINE 25 MG tablet Commonly known as: BENADRYL Take 25 mg by mouth 2 (two) times daily as needed.   DULoxetine 60 MG capsule Commonly known as: CYMBALTA Take 60 mg by mouth 2 (two) times daily.   gabapentin 300 MG capsule Commonly known as: NEURONTIN Take 600 mg by mouth 3 (three) times daily.   methocarbamol 750 MG tablet Commonly known as: ROBAXIN Take 1 tablet (750 mg total) by mouth every 8 (eight) hours as needed for muscle spasms.   mirtazapine 30 MG tablet Commonly known as: REMERON Take 30 mg by mouth at bedtime.   multivitamin with minerals tablet Take 1 tablet by mouth daily.   naloxegol oxalate 25 MG Tabs tablet Commonly known as: MOVANTIK Take 1 tablet (25 mg total) by mouth daily. Start taking on: October 19, 2022   naproxen sodium 220 MG tablet Commonly known as: ALEVE Take 220 mg by mouth daily as needed.   nicotine 21 mg/24hr patch Commonly known as: NICODERM CQ - dosed in mg/24 hours Place 1 patch (21 mg total) onto the skin daily.   oxyCODONE 5 MG immediate release tablet Commonly known as: Oxy IR/ROXICODONE Take 1-2 tablets (5-10 mg total) by mouth every 8 (eight) hours as  needed (severe pain despite tylenol and/or tramadol).   polyethylene glycol 17 g packet Commonly known as: MIRALAX / GLYCOLAX Take 17 g by mouth daily as needed (constipation > 48 hours despite Movantik). What changed: reasons to take this   simethicone 80 MG chewable tablet Commonly known as: MYLICON Chew 1 tablet (80 mg total) by mouth 4 (four) times daily as needed (abdominal gas pains or flatulence).   traMADol 50 MG tablet Commonly known as: ULTRAM Take 1 tablet (50 mg total) by mouth every 6 (six) hours as needed for moderate pain (despite tylenol (use before oxycodone, reserve oxycodone for severe pain)).   traZODone 150 MG tablet Commonly known as: DESYREL Take 150 mg by mouth at bedtime.        Contact information for after-discharge care     Destination     [facility] .   Service: Skilled Nursing Contact information: [address]  [address] [place] [address] [phone]                    Discharge Exam: Filed Weights   10/09/22 1019 10/09/22 2039  Weight: 86 kg 81.6 kg   General exam: awake, alert, no acute distress HEENT: atraumatic, clear conjunctiva, anicteric sclera, moist mucus membranes, hearing grossly normal  Respiratory system: CTAB, no wheezes, rales or rhonchi, normal respiratory effort. Cardiovascular system: normal S1/S2, RRR, no JVD, murmurs, rubs, gallops,  no pedal edema.   Gastrointestinal system: soft, NT, ND, no HSM felt, +bowel sounds. Central nervous system: A&O x3. Stable aphasia Extremities: moves all, no edema, normal tone Skin: dry, intact, normal temperature Psychiatry: normal mood, congruent affect   Condition at discharge: stable  The results of significant diagnostics from this hospitalization (including imaging, microbiology, ancillary and laboratory) are listed below for reference.   Imaging Studies: CT ABDOMEN PELVIS WO CONTRAST  Result Date: 10/16/2022 CLINICAL DATA:   Foreign body in the rectum. EXAM: CT ABDOMEN AND PELVIS WITHOUT CONTRAST TECHNIQUE: Multidetector CT imaging of the abdomen and pelvis was performed following the standard protocol without IV contrast. RADIATION DOSE REDUCTION: This exam was performed according to the departmental dose-optimization program which includes automated exposure control, adjustment of the mA and/or kV according to patient size and/or use of iterative reconstruction technique. COMPARISON:  CT abdomen and pelvis 10/03/2022 FINDINGS: Lower chest: There is some nonspecific ground-glass opacities in the lung bases. Hepatobiliary: Gallstones are present. No biliary ductal dilatation. Liver is within normal limits. Pancreas: Unremarkable. No pancreatic ductal dilatation or surrounding inflammatory changes. Spleen: Normal in size without focal abnormality. Adrenals/Urinary Tract: Adrenal glands are unremarkable. Kidneys are normal, without renal calculi, focal lesion, or hydronephrosis. Bladder is unremarkable. Stomach/Bowel: Stomach is within normal limits. Appendix appears normal. No evidence of bowel wall thickening, distention, or inflammatory changes. There is sigmoid colon diverticulosis. No evidence for foreign body. Vascular/Lymphatic: No significant vascular findings are present. No enlarged abdominal or pelvic lymph nodes. Reproductive: Uterus and bilateral adnexa are unremarkable. Other: No abdominal wall hernia. No abdominopelvic ascites. Injection sites are noted in the subcutaneous tissues of the left lower anterior abdominal wall. Bilateral breast implants are present. Musculoskeletal: There are bilateral hip screws and intramedullary nails. IMPRESSION: 1. No evidence for foreign body. 2. Cholelithiasis. 3. Sigmoid colon diverticulosis. 4. Nonspecific ground-glass opacities in the lung bases, possibly infectious/inflammatory. Electronically Signed   By: Darliss Cheney M.D.   On: 10/16/2022 23:22   DG Abd 1 View  Result Date:  10/16/2022 CLINICAL DATA:  Abdominal pain. EXAM: ABDOMEN - 1 VIEW COMPARISON:  None Available. FINDINGS: The bowel gas pattern is normal. No radio-opaque calculi identified. A square object containing gas is seen overlying the rectum, which could represent a gas containing foreign body within the rectum. Internal fixation hardware is noted in both hips. IMPRESSION: Square gas-containing object overlying the rectum, which raises suspicion for a foreign body within the rectum. Otherwise normal bowel gas pattern. Electronically Signed   By: Danae Orleans M.D.   On: 10/16/2022 17:26   DG Chest Port 1 View  Result Date: 10/15/2022 CLINICAL DATA:  Hypoxia EXAM: PORTABLE CHEST 1 VIEW COMPARISON:  Chest x-ray dated October 09, 2022 FINDINGS: Cardiac and mediastinal contours are within normal limits. New mild opacity of the mid right lung. No pleural effusion or pneumothorax. IMPRESSION: New mild opacity of the mid right lung, possibly due to atelectasis or infection. Electronically Signed   By: Aliene Altes.D.  On: 10/15/2022 17:20   CT Angio Chest Pulmonary Embolism (PE) W or WO Contrast  Result Date: 10/15/2022 CLINICAL DATA:  Pulmonary embolism (PE) suspected, high prob positive D-dimer, new O2 need EXAM: CT ANGIOGRAPHY CHEST WITH CONTRAST TECHNIQUE: Multidetector CT imaging of the chest was performed using the standard protocol during bolus administration of intravenous contrast. Multiplanar CT image reconstructions and MIPs were obtained to evaluate the vascular anatomy. RADIATION DOSE REDUCTION: This exam was performed according to the departmental dose-optimization program which includes automated exposure control, adjustment of the mA and/or kV according to patient size and/or use of iterative reconstruction technique. CONTRAST:  75mL OMNIPAQUE IOHEXOL 350 MG/ML SOLN COMPARISON:  Chest XR and CTA PE, 10/09/2022. FINDINGS: Suboptimal evaluation, secondary to motion degradation. Cardiovascular:  Satisfactory opacification of the pulmonary arteries to the segmental level. No segmental or larger pulmonary embolus. Conventional 3-sided LEFT aortic arch. No atherosclerosis or hemodynamic stenosis at the origins of the great vessels. Normal heart size. No pericardial effusion. Mediastinum/Nodes: No enlarged mediastinal, hilar, or axillary lymph nodes. Thyroid gland, trachea, and esophagus demonstrate no significant findings. Lungs/Pleura: Low lung volumes with mild basilar atelectasis. No additional focal consolidation, suspicious pulmonary nodule or mass. No pleural effusion or pneumothorax. Upper Abdomen: No acute abnormality. Dependent layering gallstones within a nondistended gallbladder. Musculoskeletal: Bilateral subpectoral breast implant augmentation. No acute chest wall abnormality. No significant osseous findings. Review of the MIP images confirms the above findings. IMPRESSION: Suboptimal evaluation, within these constraints; 1.  No segmental or larger pulmonary embolus. 2. Pulmonary hypoinflation with mild atelectasis. 3. Cholelithiasis. No additional CT evidence to suggest acute cholecystitis. Additional incidental, chronic and senescent findings as above. Electronically Signed   By: Roanna Banning M.D.   On: 10/15/2022 12:51   US Venous Img Lower Bilateral (DVT)  Result Date: 10/15/2022 CLINICAL DATA:  51 year old with positive D-dimer. EXAM: BILATERAL LOWER EXTREMITY VENOUS DOPPLER ULTRASOUND TECHNIQUE: Gray-scale sonography with graded compression, as well as color Doppler and duplex ultrasound were performed to evaluate the lower extremity deep venous systems from the level of the common femoral vein and including the common femoral, femoral, profunda femoral, popliteal and calf veins including the posterior tibial, peroneal and gastrocnemius veins when visible. The superficial great saphenous vein was also interrogated. Spectral Doppler was utilized to evaluate flow at rest and with distal  augmentation maneuvers in the common femoral, femoral and popliteal veins. COMPARISON:  10/03/2022 FINDINGS: RIGHT LOWER EXTREMITY Common Femoral Vein: No evidence of thrombus. Normal compressibility, respiratory phasicity and response to augmentation. Saphenofemoral Junction: No evidence of thrombus. Normal compressibility and flow on color Doppler imaging. Profunda Femoral Vein: No evidence of thrombus. Normal compressibility and flow on color Doppler imaging. Femoral Vein: No evidence of thrombus. Normal compressibility, respiratory phasicity and response to augmentation. Popliteal Vein: No evidence of thrombus. Normal compressibility, respiratory phasicity and response to augmentation. Calf Veins: Visualized right deep calf veins are patent without thrombus. Superficial Great Saphenous Vein: No evidence of thrombus. Normal compressibility. Other Findings:  None. LEFT LOWER EXTREMITY Common Femoral Vein: No evidence of thrombus. Normal compressibility, respiratory phasicity and response to augmentation. Saphenofemoral Junction: No evidence of thrombus. Normal compressibility and flow on color Doppler imaging. Profunda Femoral Vein: No evidence of thrombus. Normal compressibility and flow on color Doppler imaging. Femoral Vein: No evidence of thrombus. Normal compressibility, respiratory phasicity and response to augmentation. Popliteal Vein: No evidence of thrombus. Normal compressibility, respiratory phasicity and response to augmentation. Calf Veins: Visualized left deep calf veins are patent without thrombus. Superficial Great  Saphenous Vein: No evidence of thrombus. Normal compressibility. Other Findings:  None. IMPRESSION: No evidence of deep venous thrombosis in either lower extremity. Electronically Signed   By: Richarda Overlie M.D.   On: 10/15/2022 12:33   DG Abd 1 View  Result Date: 10/14/2022 CLINICAL DATA:  Abdominal pain. EXAM: ABDOMEN - 1 VIEW COMPARISON:  None Available. FINDINGS: Fairly large  amount of stool throughout the:. No dilated large or small bowel loops are identified. No evidence of free intraperitoneal air. No evidence of renal or ureteral calculi. No acute-appearing osseous abnormality. Fixation hardware at both hips. IMPRESSION: 1. Fairly large amount of stool throughout the colon, suggesting constipation. 2. Nonobstructive bowel gas pattern. Electronically Signed   By: Bary Richard M.D.   On: 10/14/2022 13:17   DG Knee Complete 4 Views Left  Result Date: 10/11/2022 CLINICAL DATA:  Left knee pain EXAM: LEFT KNEE - COMPLETE 4+ VIEW COMPARISON:  radiographs 07/17/2020 FINDINGS: Extensive changes from previous IM rod and screw fixation of the distal left femur and proximal left tibia. No radiographic evidence of loosening. Definite acute fracture. Lateral subluxation of the tibia in relation to the femur measuring approximately 8 mm. Advanced degenerative arthritis about the left knee with complete medial compartment narrowing with bone-on-bone contact and subchondral sclerosis. No knee joint effusion. IMPRESSION: Extensive postoperative changes about the left knee. No definite acute finding. Advanced degenerative arthritis with complete loss of joint space in the medial compartment. Electronically Signed   By: Minerva Fester M.D.   On: 10/11/2022 20:16   MR BRAIN WO CONTRAST  Result Date: 10/10/2022 CLINICAL DATA:  History of CVA with aphasia and right-sided weakness presents with shortness of breath and altered mental status. EXAM: MRI HEAD WITHOUT CONTRAST TECHNIQUE: Multiplanar, multiecho pulse sequences of the brain and surrounding structures were obtained without intravenous contrast. COMPARISON:  CT head 10/03/2022 FINDINGS: Brain: There is no acute intracranial hemorrhage, extra-axial fluid collection, or acute infarct. There is a large left MCA territorial infarct with associated encephalomalacia, gliosis, ex vacuo dilatation of the left lateral ventricle, and small amount  of chronic blood products. Additional smaller cortical infarcts are seen in the right cerebral hemisphere. Background parenchymal volume otherwise appears normal. The ventricles are otherwise normal in size. There is no mass lesion.  There is no mass effect or midline shift. Vascular: Normal flow voids. Skull and upper cervical spine: Normal marrow signal. Sinuses/Orbits: The paranasal sinuses are clear. The globes and orbits are unremarkable. Other: None. IMPRESSION: 1. No acute intracranial pathology. 2. Unchanged large remote left MCA territorial infarct and smaller remote infarcts in the right cerebral hemisphere. Electronically Signed   By: Lesia Hausen M.D.   On: 10/10/2022 12:29   CT Angio Chest PE W and/or Wo Contrast  Result Date: 10/09/2022 CLINICAL DATA:  Recent COVID, shortness of breath EXAM: CT ANGIOGRAPHY CHEST WITH CONTRAST TECHNIQUE: Multidetector CT imaging of the chest was performed using the standard protocol during bolus administration of intravenous contrast. Multiplanar CT image reconstructions and MIPs were obtained to evaluate the vascular anatomy. RADIATION DOSE REDUCTION: This exam was performed according to the departmental dose-optimization program which includes automated exposure control, adjustment of the mA and/or kV according to patient size and/or use of iterative reconstruction technique. CONTRAST:  33mL OMNIPAQUE IOHEXOL 350 MG/ML SOLN COMPARISON:  Chest x-ray 10/09/2022 FINDINGS: Cardiovascular: Satisfactory opacification of the pulmonary arteries to the segmental level. No evidence of pulmonary embolism. Nonaneurysmal aorta. No dissection is seen. Upper normal cardiac size. Trace pericardial effusion Mediastinum/Nodes:  Midline trachea. No thyroid mass. Borderline right paratracheal nodes measuring up to 10 mm. Esophagus within normal limits. Lungs/Pleura: Heterogeneous bilateral ground-glass densities and patchy consolidations, worse in the right upper lobe, consistent  with bilateral pneumonia. No pleural effusion or pneumothorax. Upper Abdomen: No acute abnormality. Musculoskeletal: Bilateral breast implants with mild capsular calcification. No acute osseous abnormality Review of the MIP images confirms the above findings. IMPRESSION: 1. Negative for acute pulmonary embolus or aortic dissection. 2. Heterogeneous bilateral ground-glass densities and patchy consolidations, worst in the right upper lobe, consistent with bilateral pneumonia Electronically Signed   By: Jasmine Pang M.D.   On: 10/09/2022 17:52   DG Chest Port 1 View  Result Date: 10/09/2022 CLINICAL DATA:  Shortness of breath. EXAM: PORTABLE CHEST 1 VIEW COMPARISON:  October 03, 2022. FINDINGS: Stable cardiomediastinal silhouette. Mild central pulmonary vascular congestion is noted with possible bilateral pulmonary edema. Bony thorax is unremarkable. IMPRESSION: Mild central pulmonary vascular congestion is noted with possible bilateral pulmonary edema. Electronically Signed   By: Lupita Raider M.D.   On: 10/09/2022 11:24   US Venous Img Lower Bilateral (DVT)  Result Date: 10/03/2022 CLINICAL DATA:  Cough, shortness of breath and fever. Fall. Question DVT. EXAM: BILATERAL LOWER EXTREMITY VENOUS DOPPLER ULTRASOUND TECHNIQUE: Gray-scale sonography with graded compression, as well as color Doppler and duplex ultrasound were performed to evaluate the lower extremity deep venous systems from the level of the common femoral vein and including the common femoral, femoral, profunda femoral, popliteal and calf veins including the posterior tibial, peroneal and gastrocnemius veins when visible. The superficial great saphenous vein was also interrogated. Spectral Doppler was utilized to evaluate flow at rest and with distal augmentation maneuvers in the common femoral, femoral and popliteal veins. COMPARISON:  None Available. FINDINGS: RIGHT LOWER EXTREMITY Common Femoral Vein: No evidence of thrombus. Normal  compressibility. Patient refused color and Doppler imaging. Saphenofemoral Junction: No evidence of thrombus. Normal compressibility. Patient refused color and Doppler imaging. Profunda Femoral Vein: No evidence of thrombus. Normal compressibility. Patient refused color and Doppler imaging. Femoral Vein: No evidence of thrombus. Normal compressibility. Patient refused color and Doppler imaging. Popliteal Vein: Not imaged.  Patient refused further imaging. Calf Veins: Not imaged.  Patient refused further imaging Superficial Great Saphenous Vein: Not imaged. Patient refused further imaging Venous Reflux:  None. Other Findings:  None. LEFT LOWER EXTREMITY Common Femoral Vein: No evidence of thrombus. Normal compressibility, respiratory phasicity and response to augmentation. Saphenofemoral Junction: No evidence of thrombus. Normal compressibility and flow on color Doppler imaging. Profunda Femoral Vein: No evidence of thrombus. Normal compressibility and flow on color Doppler imaging. Femoral Vein: No evidence of thrombus. Normal compressibility, respiratory phasicity and response to augmentation. Popliteal Vein: No evidence of thrombus. Normal compressibility, respiratory phasicity and response to augmentation. Calf Veins: No evidence of thrombus. Normal compressibility and flow on color Doppler imaging. Superficial Great Saphenous Vein: No evidence of thrombus. Normal compressibility. Venous Reflux:  None. Other Findings:  None. IMPRESSION: No evidence of deep venous thrombosis in either lower extremity. Of note, incomplete evaluation was performed in the right lower extremity as described above. Electronically Signed   By: Marin Roberts M.D.   On: 10/03/2022 16:23   CT CERVICAL SPINE WO CONTRAST  Result Date: 10/03/2022 CLINICAL DATA:  Neck trauma, fall EXAM: CT CERVICAL SPINE WITHOUT CONTRAST TECHNIQUE: Multidetector CT imaging of the cervical spine was performed without intravenous contrast.  Multiplanar CT image reconstructions were also generated. RADIATION DOSE REDUCTION: This exam was performed  according to the departmental dose-optimization program which includes automated exposure control, adjustment of the mA and/or kV according to patient size and/or use of iterative reconstruction technique. COMPARISON:  03/27/2020 FINDINGS: Alignment: Facet joints are aligned without dislocation or traumatic listhesis. Dens and lateral masses are aligned. Straightening with smooth reversal of the cervical lordosis. Skull base and vertebrae: No acute fracture. No primary bone lesion or focal pathologic process. Soft tissues and spinal canal: No prevertebral fluid or swelling. No visible canal hematoma. Disc levels:  Degenerative disc disease of C5-6 is similar to prior. Upper chest: Hypoventilatory changes within the included lung fields. Other: None. IMPRESSION: 1. No acute fracture or traumatic listhesis of the cervical spine. 2. Straightening with smooth reversal of the cervical lordosis, which may be secondary to positioning or muscle spasm. Electronically Signed   By: Duanne Guess D.O.   On: 10/03/2022 11:39   CT HEAD WO CONTRAST ( )  Result Date: 10/03/2022 CLINICAL DATA:  Trauma EXAM: CT HEAD WITHOUT CONTRAST TECHNIQUE: Contiguous axial images were obtained from the base of the skull through the vertex without intravenous contrast. RADIATION DOSE REDUCTION: This exam was performed according to the departmental dose-optimization program which includes automated exposure control, adjustment of the mA and/or kV according to patient size and/or use of iterative reconstruction technique. COMPARISON:  CT head 03/27/20 FINDINGS: Brain: Evolving large left MCA territory infarct involving large portions of the left frontal, parietal, and anterior temporal lobes. No evidence of hemorrhage. No hydrocephalus. No extra-axial fluid collection. There is mild ex vacuo dilatation of the left lateral ventricular  system. Vascular: No hyperdense vessel or unexpected calcification. Skull: Prior maxillary and mandibular fixation changes. Sinuses/Orbits: Mild mucosal thickening bilateral maxillary sinuses. There is a chronic fracture of the lateral wall of the left maxillary sinus. Impacted cerumen in bilateral external auditory canals. Other: None. IMPRESSION: Evolving large left MCA territory infarct. No evidence of hemorrhage or acute intracranial injury. Electronically Signed   By: Lorenza Cambridge M.D.   On: 10/03/2022 11:38   CT ABDOMEN PELVIS W CONTRAST  Result Date: 10/03/2022 CLINICAL DATA:  Abdominal pain, acute, nonlocalized EXAM: CT ABDOMEN AND PELVIS WITH CONTRAST TECHNIQUE: Multidetector CT imaging of the abdomen and pelvis was performed using the standard protocol following bolus administration of intravenous contrast. RADIATION DOSE REDUCTION: This exam was performed according to the departmental dose-optimization program which includes automated exposure control, adjustment of the mA and/or kV according to patient size and/or use of iterative reconstruction technique. CONTRAST:  OMNIPAQUE IOHEXOL 300 MG/ML  SOLN COMPARISON:  08/30/2014 FINDINGS: Lower chest: Dependent atelectasis. Symmetric inflation of the covered breast implants. Hepatobiliary: There could be hepatic steatosis but limited by post contrast timing.Cholelithiasis. No evidence of biliary inflammation. Pancreas: Unremarkable. Spleen: Unremarkable. Adrenals/Urinary Tract: Negative adrenals. No hydronephrosis or stone. Unremarkable bladder. Stomach/Bowel:  No obstruction. No appendicitis. Vascular/Lymphatic: No acute vascular abnormality. No mass or adenopathy. Reproductive:No pathologic findings. Other: No ascites or pneumoperitoneum. Musculoskeletal: No acute abnormalities. Femoral neck fixation on both sides. IMPRESSION: 1. No acute intra-abdominal finding. 2. Cholelithiasis and suspected hepatic steatosis. 3. Atelectasis at the lung  bases. Electronically Signed   By: Tiburcio Pea M.D.   On: 10/03/2022 06:29   DG Chest Port 1 View  Result Date: 10/03/2022 CLINICAL DATA:  Fall from bed.  Questionable sepsis EXAM: PORTABLE CHEST 1 VIEW COMPARISON:  07/17/2020 FINDINGS: Limited low volume chest with diffuse interstitial crowding. Linear atelectasis on the right. Cardiopericardial enlargement. No visible effusion or pneumothorax. Extensive artifact from EKG leads. IMPRESSION:  Very low volume chest with atelectasis.  No convincing pneumonia. Electronically Signed   By: Tiburcio Pea M.D.   On: 10/03/2022 04:42    Microbiology: Results for orders placed or performed during the hospital encounter of 10/09/22  MRSA Next Gen by PCR, Nasal     Status: None   Collection Time: 10/09/22  6:30 PM   Specimen: Nasal Mucosa; Nasal Swab  Result Value Ref Range Status   MRSA by PCR Next Gen NOT DETECTED NOT DETECTED Final    Comment: (NOTE) The GeneXpert MRSA Assay (FDA approved for NASAL specimens only), is one component of a comprehensive MRSA colonization surveillance program. It is not intended to diagnose MRSA infection nor to guide or monitor treatment for MRSA infections. Test performance is not FDA approved in patients less than 31 years old. Performed at [facility], [address] [place], [place] [address]   Blood culture (routine x 2)     Status: None   Collection Time: 10/09/22  7:57 PM   Specimen: BLOOD  Result Value Ref Range Status   Specimen Description BLOOD BLOOD RIGHT FOREARM  Final   Special Requests   Final    BOTTLES DRAWN AEROBIC AND ANAEROBIC Blood Culture results may not be optimal due to an excessive volume of blood received in culture bottles   Culture   Final    NO GROWTH 5 DAYS Performed at [facility], [address] [place], [place] [address]    Report Status 10/14/2022 FINAL  Final  Culture, blood (Routine X 2) w Reflex to ID Panel     Status: None   Collection Time:  10/10/22  6:11 AM   Specimen: BLOOD  Result Value Ref Range Status   Specimen Description BLOOD LEFT HAND  Final   Special Requests   Final    BOTTLES DRAWN AEROBIC AND ANAEROBIC Blood Culture adequate volume   Culture   Final    NO GROWTH 5 DAYS Performed at [facility], [address] [place], [place] [address]    Report Status 10/15/2022 FINAL  Final  Respiratory (~20 pathogens) panel by PCR     Status: None   Collection Time: 10/11/22 11:57 AM   Specimen: Nasopharyngeal Swab; Respiratory  Result Value Ref Range Status   Adenovirus NOT DETECTED NOT DETECTED Final   Coronavirus 229E NOT DETECTED NOT DETECTED Final    Comment: (NOTE) The Coronavirus on the Respiratory Panel, DOES NOT test for the novel  Coronavirus (2019 nCoV)    Coronavirus HKU1 NOT DETECTED NOT DETECTED Final   Coronavirus NL63 NOT DETECTED NOT DETECTED Final   Coronavirus OC43 NOT DETECTED NOT DETECTED Final   Metapneumovirus NOT DETECTED NOT DETECTED Final   Rhinovirus / Enterovirus NOT DETECTED NOT DETECTED Final   Influenza A NOT DETECTED NOT DETECTED Final   Influenza B NOT DETECTED NOT DETECTED Final   Parainfluenza Virus 1 NOT DETECTED NOT DETECTED Final   Parainfluenza Virus 2 NOT DETECTED NOT DETECTED Final   Parainfluenza Virus 3 NOT DETECTED NOT DETECTED Final   Parainfluenza Virus 4 NOT DETECTED NOT DETECTED Final   Respiratory Syncytial Virus NOT DETECTED NOT DETECTED Final   Bordetella pertussis NOT DETECTED NOT DETECTED Final   Bordetella Parapertussis NOT DETECTED NOT DETECTED Final   Chlamydophila pneumoniae NOT DETECTED NOT DETECTED Final   Mycoplasma pneumoniae NOT DETECTED NOT DETECTED Final    Comment: Performed at [facility], [address] [place], [place] [address]    Labs:  CBC: Recent Labs  Lab 10/15/22 0057 10/16/22 0542 10/17/22 0811  WBC 11.4* 7.1 6.0  NEUTROABS 7.7  --   --   HGB 13.7 13.3 12.5  HCT 39.4 37.8 35.5*  MCV 88.3 87.7 88.1  PLT 292  285 253   Basic Metabolic Panel: Recent Labs  Lab 10/15/22 0057 10/17/22 0811  NA 136 137  K 4.5 4.0  CL 101 106  CO2 26 25  GLUCOSE 132* 118*  BUN 21* 23*  CREATININE 0.75 0.63  CALCIUM 9.2 8.9  MG 2.1  --    Liver Function Tests: Recent Labs  Lab 10/15/22 0057  AST 29  ALT 38  ALKPHOS 121  BILITOT 0.7  PROT 7.6  ALBUMIN 3.7   CBG: No results for input(s): "GLUCAP" in the last 168 hours.  Discharge time spent: less than 30 minutes.  Signed: Pennie Banter, DO [facility] 10/18/2022

## 2022-10-18 NOTE — Progress Notes (Signed)
   10/18/22 1211  Medical Necessity for Transport Certificate --- IF THIS TRANSPORT IS ROUND TRIP OR SCHEDULED AND REPEATED, A PHYSICIAN MUST COMPLETE THIS FORM  Transport from: Scientist, product/process development) Geographical information systems officer to (Location) Other (Comment) ([facility] Room A9)  Did the patient arrive from a Skilled Nursing Facility, Assisted Living Facility or Group Home? No  Is this the closest appropriate facility? Yes  Date of Transport Service 10/18/22  Name of Transporting Agency [facility]  Round Trip Transport? No  Reason for Transport Discharge  Is this a hospice patient? 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 1. Patient unable to get up from bed without assistance  AND  2. Unable to ambulate  AND  3. Unable to sit in a chair, including wheelchair. No  Q2 Could the patient be transported safely by other means of transportation (I.E., wheelchair van)? No  Q3 Please check any of the following conditions that apply at the time of transport: Altered mental status;Risk of injury to self and/or others;Other (Comment) (expressive aphasia)  Electronic Signature Tempie Hoist  Credentials DP  Date Signed 10/18/22

## 2022-10-18 NOTE — Progress Notes (Signed)
Pulse elevated, Dr Denton Lank made aware. Patient discharged with EMS. All IVs removed and belongings sent with patient.

## 2022-10-18 NOTE — TOC Transition Note (Signed)
Transition of Care [facility]) - CM/SW Discharge Note   Patient Details  Name: Maille Halliwell MRN: [id number] Date of Birth: 1971-09-28  [facility] CM/SW Contact:  Tempie Hoist, LCSWA Phone Number: 10/18/2022, 12:27 PM   Clinical Narrative:     TOC spoke to daughter Cain Saupe to inform her that the patient will transfer to room A9 at [facility] today. TOC arranging [facility] to SNF. RN is calling report.  Final next level of care: Skilled Nursing Facility Barriers to Discharge: Barriers Resolved   Patient Goals and CMS Choice Patient states their goals for this hospitalization and ongoing recovery are:: attend SNF      Discharge Placement PASRR number recieved: 10/14/22 Existing PASRR number confirmed : 10/14/22          Patient chooses bed at: Other - please specify in the comment section below: [facility]) Patient to be transferred to facility by: [facility] Name of family member notified: [name] [phone] Patient and family notified of of transfer: 10/18/22  Discharge Plan and Services                 [facility] room A9                    Social Determinants of Health (SDOH) Interventions     Readmission Risk Interventions     No data to display

## 2022-10-20 ENCOUNTER — Encounter: Payer: Self-pay | Admitting: Nurse Practitioner

## 2022-10-20 ENCOUNTER — Non-Acute Institutional Stay: Payer: Medicaid Other | Admitting: Nurse Practitioner

## 2022-10-20 DIAGNOSIS — Z515 Encounter for palliative care: Secondary | ICD-10-CM

## 2022-10-20 DIAGNOSIS — R5381 Other malaise: Secondary | ICD-10-CM

## 2022-10-20 DIAGNOSIS — S069X9S Unspecified intracranial injury with loss of consciousness of unspecified duration, sequela: Secondary | ICD-10-CM
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
## 2022-10-31 ENCOUNTER — Emergency Department: Payer: Medicaid Other

## 2022-10-31 ENCOUNTER — Emergency Department
Admission: EM | Admit: 2022-10-31 | Discharge: 2022-11-01 | Disposition: A | Discharge status: Skilled Nursing Facility | Payer: Medicaid Other | Attending: Emergency Medicine | Admitting: Emergency Medicine

## 2022-10-31 ENCOUNTER — Other Ambulatory Visit: Payer: Self-pay

## 2022-10-31 DIAGNOSIS — M7989 Other specified soft tissue disorders: Secondary | ICD-10-CM | POA: Insufficient documentation

## 2022-10-31 DIAGNOSIS — R0602 Shortness of breath: Secondary | ICD-10-CM | POA: Insufficient documentation

## 2022-10-31 DIAGNOSIS — Z20822 Contact with and (suspected) exposure to covid-19: Secondary | ICD-10-CM | POA: Insufficient documentation

## 2022-10-31 DIAGNOSIS — R531 Weakness: Secondary | ICD-10-CM | POA: Diagnosis not present

## 2022-10-31 DIAGNOSIS — K8020 Calculus of gallbladder without cholecystitis without obstruction: Secondary | ICD-10-CM

## 2022-10-31 DIAGNOSIS — R1010 Upper abdominal pain, unspecified: Secondary | ICD-10-CM | POA: Insufficient documentation

## 2022-10-31 DIAGNOSIS — R0789 Other chest pain: Secondary | ICD-10-CM | POA: Insufficient documentation

## 2022-10-31 DIAGNOSIS — F1210 Cannabis abuse, uncomplicated: Secondary | ICD-10-CM | POA: Insufficient documentation

## 2022-10-31 DIAGNOSIS — Z8616 Personal history of COVID-19: Secondary | ICD-10-CM | POA: Diagnosis not present

## 2022-10-31 LAB — URINALYSIS, ROUTINE W REFLEX MICROSCOPIC
Bilirubin Urine: NEGATIVE
Glucose, UA: NEGATIVE mg/dL
Hgb urine dipstick: NEGATIVE
Ketones, ur: NEGATIVE mg/dL
Leukocytes,Ua: NEGATIVE
Nitrite: NEGATIVE
Protein, ur: NEGATIVE mg/dL
Specific Gravity, Urine: 1.012 (ref 1.005–1.030)
pH: 7 (ref 5.0–8.0)

## 2022-10-31 LAB — HEPATIC FUNCTION PANEL
ALT: 32 U/L (ref 0–44)
AST: 31 U/L (ref 15–41)
Albumin: 3.8 g/dL (ref 3.5–5.0)
Alkaline Phosphatase: 120 U/L (ref 38–126)
Bilirubin, Direct: <0.1 mg/dL (ref 0.0–0.2)
Total Bilirubin: 0.5 mg/dL (ref 0.3–1.2)
Total Protein: 7.6 g/dL (ref 6.5–8.1)

## 2022-10-31 LAB — BASIC METABOLIC PANEL
Anion gap: 4 — ABNORMAL LOW (ref 5–15)
BUN: 15 mg/dL (ref 6–20)
CO2: 25 mmol/L (ref 22–32)
Calcium: 8.6 mg/dL — ABNORMAL LOW (ref 8.9–10.3)
Chloride: 107 mmol/L (ref 98–111)
Creatinine, Ser: 0.65 mg/dL (ref 0.44–1.00)
GFR, Estimated: >60 mL/min (ref >60)
Glucose, Bld: 98 mg/dL (ref 70–99)
Potassium: 4 mmol/L (ref 3.5–5.1)
Sodium: 136 mmol/L (ref 135–145)

## 2022-10-31 LAB — CBC
HCT: 38.6 % (ref 36.0–46.0)
Hemoglobin: 13.1 g/dL (ref 12.0–15.0)
MCH: 30.5 pg (ref 26.0–34.0)
MCHC: 33.9 g/dL (ref 30.0–36.0)
MCV: 90 fL (ref 80.0–100.0)
Platelets: 260 10*3/uL (ref 150–400)
RBC: 4.29 MIL/uL (ref 3.87–5.11)
RDW: 14.6 % (ref 11.5–15.5)
WBC: 6.3 10*3/uL (ref 4.0–10.5)
nRBC: 0 % (ref 0.0–0.2)

## 2022-10-31 LAB — URINE DRUG SCREEN, QUALITATIVE (ARMC ONLY)
Amphetamines, Ur Screen: NOT DETECTED
Barbiturates, Ur Screen: NOT DETECTED
Benzodiazepine, Ur Scrn: NOT DETECTED
Cannabinoid 50 Ng, Ur ~~LOC~~: POSITIVE — AB
Cocaine Metabolite,Ur ~~LOC~~: NOT DETECTED
MDMA (Ecstasy)Ur Screen: NOT DETECTED
Methadone Scn, Ur: NOT DETECTED
Opiate, Ur Screen: NOT DETECTED
Phencyclidine (PCP) Ur S: NOT DETECTED
Tricyclic, Ur Screen: NOT DETECTED

## 2022-10-31 LAB — TROPONIN I (HIGH SENSITIVITY)
Troponin I (High Sensitivity): <2 ng/L (ref <18)
Troponin I (High Sensitivity): 3 ng/L (ref <18)

## 2022-10-31 LAB — POC URINE PREG, ED: Preg Test, Ur: NEGATIVE

## 2022-10-31 LAB — LIPASE, BLOOD: Lipase: 49 U/L (ref 11–51)

## 2022-10-31 MED ORDER — IOHEXOL 350 MG/ML SOLN
100.0000 mL | Freq: Once | INTRAVENOUS | Status: AC | PRN
  Administered 2022-10-31: 100 mL via INTRAVENOUS

## 2022-10-31 MED ORDER — ONDANSETRON HCL 4 MG/2ML IJ SOLN
4.0000 mg | Freq: Once | INTRAMUSCULAR | Status: AC
  Administered 2022-10-31: 4 mg via INTRAVENOUS
  Filled 2022-10-31: qty 2

## 2022-10-31 MED ORDER — HYDROMORPHONE HCL 1 MG/ML IJ SOLN
0.5000 mg | Freq: Once | INTRAMUSCULAR | Status: AC
  Administered 2022-10-31: 0.5 mg via INTRAVENOUS
  Filled 2022-10-31: qty 0.5

## 2022-10-31 NOTE — ED Provider Notes (Signed)
[facility] Provider Note    Event Date/Time   First MD Initiated Contact with Patient 10/31/22 2040     (approximate)   History   Chest Pain   HPI  Gloria Aguilar is a 51 y.o. female with prior TBI and stroke with chronic aphasia and right-sided weakness who comes in with concerns for chest and abdominal pain.  Reviewed patient's notes in November, 19 she was positive for COVID and was on 3 L of oxygen also treated for UTI.  This was then patient was readmitted on 11/25 where she was readmitted for pneumonia.  Patient comes in from [facility] due to concern for chest pain upper abdominal pain that started a few hours before coming in.  At baseline she does have right-sided paralysis and aphasia.  She they report trying many different things to try to help but nothing has helped.  She does report a little bit of shortness of breath associated with it.  She initially denied any falls in her head but then later told her POA that she might of had a fall.  Unclear exactly when.  Her POA is at bedside he does report some increased swelling in her right leg than baseline.   Physical Exam   Triage Vital Signs: ED Triage Vitals  Enc Vitals Group     BP 10/31/22 1816 104/88     Pulse Rate 10/31/22 1816 90     Resp 10/31/22 1816 20     Temp 10/31/22 1816 97.9 F (36.6 C)     Temp Source 10/31/22 1816 Oral     SpO2 10/31/22 1816 94 %     Weight 10/31/22 1811 180 lb 12.4 oz (82 kg)     Height 10/31/22 1811 5\' 2"  (1.575 m)     Head Circumference --      Peak Flow --      Pain Score --      Pain Loc --      Pain Edu? --      Excl. in GC? --     Most recent vital signs: Vitals:   10/31/22 1816  BP: 104/88  Pulse: 90  Resp: 20  Temp: 97.9 F (36.6 C)  SpO2: 94%     General: Awake, no distress.  CV:  Good peripheral perfusion.  Resp:  Normal effort.  Abd:  No distention.  Other:  Patient has baseline weakness on the right side.  She is unable to move  them.  She has got some swelling noted to her right leg with 2+ distal pulse.  No redness or erythema noted.  She has got some upper abdominal discomfort with palpation.  She has baseline aphasia.  Her POA states that neurologically she is at her baseline self.   ED Results / Procedures / Treatments   Labs (all labs ordered are listed, but only abnormal results are displayed) Labs Reviewed  BASIC METABOLIC PANEL - Abnormal; Notable for the following components:      Result Value   Calcium 8.6 (*)    Anion gap 4 (*)    All other components within normal limits  CBC  POC URINE PREG, ED  TROPONIN I (HIGH SENSITIVITY)  TROPONIN I (HIGH SENSITIVITY)     EKG  My interpretation of EKG:  Normal sinus rhythm 91 without any ST elevation or T wave inversions, normal intervals  RADIOLOGY I have reviewed the xray personally and interpreted and a little bit of unchanged opacification likely atelectasis  in the right midlung PROCEDURES:  Critical Care performed: No  Procedures   MEDICATIONS ORDERED IN ED: Medications  HYDROmorphone (DILAUDID) injection 0.5 mg (0.5 mg Intravenous Given 10/31/22 2104)  ondansetron (ZOFRAN) injection 4 mg (4 mg Intravenous Given 10/31/22 2103)     IMPRESSION / MDM / ASSESSMENT AND PLAN / ED COURSE  I reviewed the triage vital signs and the nursing notes.   Patient's presentation is most consistent with acute presentation with potential threat to life or bodily function.   Patient comes in with concerns for chest pain some abdominal pain.  She does have some swelling in her right leg.  Difficult to get a great history given limited due to aphasia.  POA her significant other is at bedside and have also already talked to her daughter.  Will get CT scans to evaluate for any new intracranial hemorrhage given a report of a fall but unclear when this was, cervical fracture, PE, pneumonia, abdominal issues.  Will get ultrasound to make sure no signs of  cholecystitis again ultrasound of her right leg to make sure no signs of DVT.  Will give 1 dose of IV Dilaudid and IV Zofran to help with symptoms  Troponins are negative x 2.  Pregnancy test is negative.  Urine without evidence of UTI.  Her BMP is reassuring CBC reassuring.  Hepatic function is normal lipase is normal  Chest x-ray reassuring.  Ultrasound without evidence of cholecystitis.  Patient will be handed off to oncoming team pending CT scans and ultrasound.       FINAL CLINICAL IMPRESSION(S) / ED DIAGNOSES   Final diagnoses:  Atypical chest pain     Rx / DC Orders   ED Discharge Orders     None        Note:  This document was prepared using Dragon voice recognition software and may include unintentional dictation errors.   Concha Se, MD 10/31/22 2230

## 2022-10-31 NOTE — ED Notes (Signed)
Patient transported to CT 

## 2022-10-31 NOTE — ED Triage Notes (Signed)
Pt to ED [facility] from [facility] at [facility] for CP since 5 hours. Staff told EMS they believe may be gas. Pt unable to provide good history. States pain is sharp. See first nurse note for more detail.   Hx stroke aphasia and R hemiplegia. Says some words, unable to get good hx. Following commands.

## 2022-10-31 NOTE — ED Notes (Signed)
Patient transported to us 

## 2022-10-31 NOTE — ED Notes (Signed)
ED Provider at bedside. 

## 2022-10-31 NOTE — ED Notes (Signed)
Please call daughter with update when available.

## 2022-10-31 NOTE — ED Notes (Signed)
Gloria Aguilar Daughter [phone]

## 2022-10-31 NOTE — ED Notes (Signed)
ED Provider at bedside speaking with POA.

## 2022-10-31 NOTE — ED Notes (Signed)
Pt back from [place], POA Homero Fellers is currently at the bedside.

## 2022-10-31 NOTE — ED Provider Triage Note (Signed)
Emergency Medicine Provider Triage Evaluation Note  Gloria Aguilar , a 51 y.o. female  was evaluated in triage.  Pt complains of chest pain x 5 hours.  No alleviating measures attempted prior to arrival.  EMS report stable vitals and reassuring EKG..  Physical Exam  Ht 5\' 2"  (1.575 m)   Wt 82 kg   BMI 33.06 kg/m  Gen:   Awake, no distress   Resp:  Normal effort  MSK:   Moves extremities without difficulty  Other:    Medical Decision Making  Medically screening exam initiated at 6:11 PM.  Appropriate orders placed.  Gloria Aguilar was informed that the remainder of the evaluation will be completed by another provider, this initial triage assessment does not replace that evaluation, and the importance of remaining in the ED until their evaluation is complete.    Windell Hummingbird, FNP 10/31/22 1858

## 2022-10-31 NOTE — ED Triage Notes (Signed)
Pt in via EMS from [facility] with c/o Cp all day long. Facility reports it may be gas pains. Facility gave pt robaxin x's 2, lactulose x's 1, albuterol x's 1, tramadol x's 2, percocet x's 3, ASA 325mg , klonopin 1.5mg , and simethicone x's 1 to try and identity the pain. Pt with right side paralysis and aphasic. Per facility when pt needs something or has pain she states "gotta go now" over and over. 136/85, 94HR, 97% on 2L, 94% on RA, CBG 123, RR 18

## 2022-10-31 NOTE — ED Notes (Signed)
Pt brought to ED 5 H at this time, this RN now assuming care. Pt was c/o having to urinate. Purewick placed.

## 2022-11-01 LAB — RESP PANEL BY RT-PCR (RSV, FLU A&B, COVID)  RVPGX2
Influenza A by PCR: NEGATIVE
Influenza B by PCR: NEGATIVE
Resp Syncytial Virus by PCR: NEGATIVE
SARS Coronavirus 2 by RT PCR: NEGATIVE

## 2022-11-01 NOTE — ED Notes (Signed)
Gloria Aguilar, the pts POA is taking pt home by POV. Report called to [facility] to [facility].

## 2022-11-26 ENCOUNTER — Encounter: Payer: Self-pay | Admitting: Nurse Practitioner

## 2022-11-26 ENCOUNTER — Non-Acute Institutional Stay: Payer: Medicaid Other | Admitting: Nurse Practitioner

## 2022-11-26 DIAGNOSIS — R4701 Aphasia: Secondary | ICD-10-CM

## 2022-11-26 DIAGNOSIS — Z515 Encounter for palliative care: Secondary | ICD-10-CM

## 2022-11-26 DIAGNOSIS — G8191 Hemiplegia, unspecified affecting right dominant side: Secondary | ICD-10-CM

## 2022-11-26 DIAGNOSIS — R5381 Other malaise: Secondary | ICD-10-CM

## 2022-11-26 NOTE — Progress Notes (Signed)
[facility] Consult Note Telephone: [phone]  Fax: [phone]    Date of encounter: 11/26/22 1:12 PM PATIENT NAME: Gloria Aguilar [id number]   [phone] (home) [phone] (work) DOB: 04/02/1971 MRN: [id number] PRIMARY CARE PROVIDER:    [facility]  RESPONSIBLE PARTY:    Contact Information     Name Relation Home Work Mobile   [name],[name] Daughter [phone]     [name] [phone]       I met face to face with patient in facility. Palliative Care was asked to follow this patient by consultation request of  Tarry Kos, Archie Endo, DO /[facility] to address advance care planning and complex medical decision making. This is the initial visit.                               ASSESSMENT AND PLAN / RECOMMENDATIONS:  Symptom Management/Plan: 1. Advance Care Planning;  Ongoing discussions 2. Goals of Care: Goals include to maximize quality of life and symptom management. Our advance care planning conversation included a discussion about:    The value and importance of advance care planning  Exploration of personal, cultural or spiritual beliefs that might influence medical decisions  Exploration of goals of care in the event of a sudden injury or illness  Identification and preparation of a healthcare agent  Review and updating or creation of an advance directive document. 3. Palliative care encounter; Palliative care encounter; Palliative medicine team will continue to support patient, patient's family, and medical team. Visit consisted of counseling and education dealing with the complex and emotionally intense issues of symptom management and palliative care in the setting of serious and potentially life-threatening illness   4. 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 Return 2 to 4 weeks or prn.   I spent 45 minutes providing this consultation starting at . More than 50% of the time in this consultation was spent in counseling and care coordination. PPS: 40% Chief Complaint: Initial palliative consult for complex medical decision making, address goals, manage ongoing symptoms   HISTORY OF PRESENT ILLNESS:  Gloria Aguilar is a 52 y.o. 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 Gloria Aguilar currently resides at [facility], requires assistance with transfers, mobility, adl's including dressing, bathing, toileting. Ms. Falotico requires assistance with feeding. Hospitalization 10/09/2022 for PNA. ED visit 10/31/2022 to 11/01/2022 for abdominal pain/CP workup significant for atypical chest pain.   I visited and observed Ms Aguilar  Gloria Aguilar was cooperative with assessment, support provided. I attempted to contact RP. Medical goals, social and family hx reviewed, medications and poc. Will continue currently until further discussions with RP. Updated. Staff.    History obtained from review of EMR, discussion with primary team, and interview with family, facility staff/caregiver and/or Gloria Aguilar.  I reviewed available labs, medications, imaging, studies and related documents from the EMR.  Records reviewed and summarized above.    Physical Exam: Constitutional: NAD General: obese, debilitated, female ENMT: oral mucous membranes moist CV: S1S2, RRR Pulmonary: LCTA Abdomen: normo-active BS + 4 quadrants, soft and non  tender MSK: w/c dependent Skin: warm and dry Neuro:  + contracture right forearm/left foot; + cognitive impairment Psych:  non-anxious affect, A and Oriented to self, place Thank you for the opportunity to participate in the care of Gloria Aguilar. Please call our office at [phone] if we can be of additional assistance.   Brix Brearley Ihor Gully, NP

## 2022-12-01 ENCOUNTER — Encounter: Payer: Self-pay | Admitting: Surgery

## 2022-12-01 ENCOUNTER — Ambulatory Visit (INDEPENDENT_AMBULATORY_CARE_PROVIDER_SITE_OTHER): Payer: Medicaid Other | Admitting: Surgery

## 2022-12-01 VITALS — BP 121/84 | HR 99 | Temp 98.0°F | Ht 63.0 in | Wt 227.0 lb

## 2022-12-01 DIAGNOSIS — K8020 Calculus of gallbladder without cholecystitis without obstruction: Secondary | ICD-10-CM

## 2022-12-01 MED ORDER — URSODIOL 300 MG PO CAPS: 300.0000 mg | ORAL_CAPSULE | Freq: Three times a day (TID) | ORAL | 2 refills | Status: AC

## 2022-12-01 NOTE — Patient Instructions (Addendum)
We will prescribe you Ursodiol to try to help with your gallbladder.  We encourage you to eat a low fat diet to help minimize any gallbladder pain.  Call us to schedule a follow up appointment with [place] once you are done with your knee surgeries.    Gallbladder Eating Plan High blood cholesterol, obesity, a sedentary lifestyle, an unhealthy diet, and diabetes are risk factors for developing gallstones. If you have a gallbladder condition, you may have trouble digesting fats and tolerating high fat intake. Eating a low-fat diet can help reduce your symptoms and may be helpful before and after having surgery to remove your gallbladder (cholecystectomy). Your health care provider may recommend that you work with a dietitian to help you reduce the amount of fat in your diet. What are tips for following this plan? General guidelines Limit your fat intake to less than 30% of your total daily calories. If you eat around 1,800 calories each day, this means eating less than 60 grams (g) of fat per day. Fat is an important part of a healthy diet. Eating a low-fat diet can make it hard to maintain a healthy body weight. Ask your dietitian how much fat, calories, and other nutrients you need each day. Eat small, frequent meals throughout the day instead of three large meals. Drink at least 8-10 cups (1.9-2.4 L) of fluid a day. Drink enough fluid to keep your urine pale yellow. If you drink alcohol: Limit how much you have to: 0-1 drink a day for women who are not pregnant. 0-2 drinks a day for men. Know how much alcohol is in a drink. In the [place] one drink equals one 12 oz bottle of beer (355 mL), one 5 oz glass of wine (148 mL), or one 1 oz glass of hard liquor (44 mL). Reading food labels  Check nutrition facts on food labels for the amount of fat per serving. Choose foods with less than 3 grams of fat per serving. Shopping Choose nonfat and low-fat healthy foods. Look for the words "nonfat,"  "low-fat," or "fat-free." Avoid buying processed or prepackaged foods. Cooking Cook using low-fat methods, such as baking, broiling, grilling, or boiling. Cook with small amounts of healthy fats, such as olive oil, grapeseed oil, canola oil, avocado oil, or sunflower oil. What foods are recommended?  All fresh, frozen, or canned fruits and vegetables. Whole grains. Low-fat or nonfat (skim) milk and yogurt. Lean meat, skinless poultry, fish, eggs, and beans. Low-fat protein supplement powders or drinks. Spices and herbs. The items listed above may not be a complete list of foods and beverages you can eat and drink. Contact a dietitian for more information. What foods are not recommended? High-fat foods. These include baked goods, fast food, fatty cuts of meat, ice cream, french toast, sweet rolls, pizza, cheese bread, foods covered with butter, creamy sauces, or cheese. Fried foods. These include french fries, tempura, battered fish, breaded chicken, fried breads, and sweets. Foods that cause bloating and gas. The items listed above may not be a complete list of foods that you should avoid. Contact a dietitian for more information. Summary A low-fat diet can be helpful if you have a gallbladder condition, or before and after gallbladder surgery. Limit your fat intake to less than 30% of your total daily calories. This is about 60 g of fat if you eat 1,800 calories each day. Eat small, frequent meals throughout the day instead of three large meals. This information is not intended to replace advice  given to you by your health care provider. Make sure you discuss any questions you have with your health care provider. Document Revised: 10/16/2021 Document Reviewed: 10/16/2021 Elsevier Patient Education  [place].

## 2022-12-03 ENCOUNTER — Encounter: Payer: Self-pay | Admitting: Surgery

## 2022-12-03 NOTE — Progress Notes (Signed)
12/01/2022  Reason for Visit:  Symptomatic cholelithiasis  History of Present Illness: Gloria Aguilar is a 52 y.o. female presenting for evaluation of symptomatic cholelithiasis.  Patient was seen in the ED on 10/31/22 for abdominal pain.  Patient has a history of prior traumatic brain injury and stroke as well as chronic aphasia and was difficult for the patient to express well symptoms that she was having.  It is also reported she had fallen prior to coming to the emergency room.  She had extensive workup in the emergency room which included an ultrasound which showed cholelithiasis with no evidence of acute cholecystitis, and a CT scan of the chest, abdomen, and pelvis which did not show any intra-abdominal abnormality except gallstones.  She was seen on 10/17/22 for abdominal pain which was thought to be related to constipation.  The patient does take laxatives to help with this.  However despite of her bowel movements being better, the pain in her abdomen still remains.  She reported the pain is almost daily and is more in the mid abdomen in a bandlike fashion.  When asking about radiation or if it goes to the back she does endorse that this is the case.  Reports having nausea and emesis as well.  It is unclear if this happens after meals but her daughter reports that the last couple episodes where early in the night.  Denies any fevers, chills, chest pain, shortness of breath.  She is recovering from her pneumonia and COVID diagnosis from November.  Past Medical History: Past Medical History:  Diagnosis Date   Anxiety    Depression    Diarrhea 10/09/2022   Dyspnea 10/13/2022   Stroke [facility])      Past Surgical History: Past Surgical History:  Procedure Laterality Date   COSMETIC SURGERY     EXTERNAL FIXATION LEG      Home Medications: Prior to Admission medications   Medication Sig Start Date End Date Taking? Authorizing Provider  acetaminophen (TYLENOL) 500 MG tablet Take 2 tablets  (1,000 mg total) by mouth every 8 (eight) hours. 10/18/22  Yes Nicole Kindred A, DO  albuterol (VENTOLIN HFA) 108 (90 Base) MCG/ACT inhaler Inhale 2 puffs into the lungs every 4 (four) hours as needed for wheezing or shortness of breath. 10/04/22  Yes Lorella Nimrod, MD  allopurinol (ZYLOPRIM) 100 MG tablet Take 100 mg by mouth daily.   Yes [provider]  aspirin 81 MG EC tablet Take 1 tablet by mouth 3 (three) times daily.   Yes [provider]  busPIRone (BUSPAR) 5 MG tablet Take 5 mg by mouth 3 (three) times daily.   Yes [provider]  clonazePAM (KLONOPIN) 0.5 MG tablet Take 3 tablets (1.5 mg total) by mouth 2 (two) times daily as needed for anxiety. 10/18/22  Yes Ezekiel Slocumb, DO  dextromethorphan-guaiFENesin (MUCINEX DM) 30-600 MG 12hr tablet Take 1 tablet by mouth 2 (two) times daily as needed for cough. 10/04/22  Yes Lorella Nimrod, MD  diclofenac Sodium (VOLTAREN) 1 % GEL Apply 2 g topically 2 (two) times daily.   Yes [provider]  dicyclomine (BENTYL) 10 MG capsule Take 1 capsule (10 mg total) by mouth 4 (four) times daily -  before meals and at bedtime. 10/18/22  Yes Nicole Kindred A, DO  diphenhydrAMINE (BENADRYL) 25 MG tablet Take 25 mg by mouth 2 (two) times daily as needed.   Yes [provider]  DULoxetine (CYMBALTA) 60 MG capsule Take 60 mg by mouth  2 (two) times daily. 08/18/21  Yes [provider]  gabapentin (NEURONTIN) 300 MG capsule Take 600 mg by mouth 3 (three) times daily. 10/12/21  Yes [provider]  lactulose (CHRONULAC) 10 GM/15ML solution Take 30 g by mouth daily as needed for mild constipation.   Yes [provider]  LORazepam (ATIVAN) 0.5 MG tablet Take 0.5 mg by mouth 2 (two) times daily.   Yes [provider]  methocarbamol (ROBAXIN) 750 MG tablet Take 1 tablet (750 mg total) by mouth every 8 (eight) hours as needed for muscle spasms. 10/16/22  Yes Esaw Grandchild A, DO   mirtazapine (REMERON) 30 MG tablet Take 30 mg by mouth at bedtime. 06/15/22  Yes [provider]  Multiple Vitamins-Minerals (MULTIVITAMIN WITH MINERALS) tablet Take 1 tablet by mouth daily.   Yes [provider]  naloxegol oxalate (MOVANTIK) 25 MG TABS tablet Take 1 tablet (25 mg total) by mouth daily. 10/19/22  Yes Esaw Grandchild A, DO  naloxone [facility]) nasal spray 4 mg/0.1 mL Place 1 spray into the nose as needed.   Yes [provider]  naproxen sodium (ALEVE) 220 MG tablet Take 220 mg by mouth daily as needed.   Yes [provider]  oxyCODONE-acetaminophen (PERCOCET/ROXICET) 5-325 MG tablet Take 1 tablet by mouth every 6 (six) hours as needed for severe pain.   Yes [provider]  polyethylene glycol (MIRALAX / GLYCOLAX) 17 g packet Take 17 g by mouth daily as needed (constipation > 48 hours despite Movantik). 10/18/22  Yes Pennie Banter, DO  senna (SENOKOT) 8.6 MG TABS tablet Take 1 tablet by mouth daily as needed for mild constipation.   Yes [provider]  simethicone (MYLICON) 80 MG chewable tablet Chew 1 tablet (80 mg total) by mouth 4 (four) times daily as needed (abdominal gas pains or flatulence). 10/18/22  Yes Esaw Grandchild A, DO  traMADol (ULTRAM) 50 MG tablet Take 1 tablet (50 mg total) by mouth every 6 (six) hours as needed for moderate pain (despite tylenol (use before oxycodone, reserve oxycodone for severe pain)). 10/18/22  Yes Esaw Grandchild A, DO  traZODone (DESYREL) 150 MG tablet Take 150 mg by mouth at bedtime. 04/28/22  Yes [provider]  ursodiol (ACTIGALL) 300 MG capsule Take 1 capsule (300 mg total) by mouth 3 (three) times daily. 12/01/22  Yes Sevastian Witczak, MD  nicotine (NICODERM CQ - DOSED IN MG/24 HOURS) 21 mg/24hr patch Place 1 patch (21 mg total) onto the skin daily. 10/05/22   Arnetha Courser, MD    Allergies: Allergies  Allergen Reactions   Cephalexin Hives   Levetiracetam Itching   Morphine     Wound Dressing Adhesive     Social History:  reports that she has quit smoking. Her smoking use included cigarettes. She has a 30.00 pack-year smoking history. She has been exposed to tobacco smoke. She has never used smokeless tobacco. She reports that she does not currently use alcohol. She reports that she does not use drugs.   Family History: No family history on file.  Review of Systems: Review of Systems  Constitutional:  Negative for chills and fever.  HENT:  Negative for hearing loss.   Respiratory:  Negative for shortness of breath.   Cardiovascular:  Negative for chest pain.  Gastrointestinal:  Positive for abdominal pain, constipation, nausea and vomiting. Negative for diarrhea.  Genitourinary:  Negative for dysuria.  Musculoskeletal:  Positive for joint pain.  Skin:  Negative for rash.  Neurological:  Negative for dizziness.  Psychiatric/Behavioral:  Negative for depression.     Physical Exam BP 121/84   Pulse 99   Temp 98 F (36.7 C)   Ht 5\' 3"  (1.6 m)   Wt 227 lb (103 kg)   LMP 12/01/2019   SpO2 98%   BMI 40.21 kg/m  CONSTITUTIONAL: No acute distress HEENT:  Normocephalic, atraumatic, extraocular motion intact. NECK: Trachea is midline, and there is no jugular venous distension.  RESPIRATORY:  Lungs are clear, and breath sounds are equal bilaterally. Normal respiratory effort without pathologic use of accessory muscles. CARDIOVASCULAR: Heart is regular without murmurs, gallops, or rubs. GI: The abdomen is soft, nondistended, with some tenderness to palpation in the mid abdomen but also in the right upper quadrant.  Negative Murphy's sign.  MUSCULOSKELETAL:  Normal muscle strength and tone in all four extremities.  No peripheral edema or cyanosis. SKIN: Skin turgor is normal. There are no pathologic skin lesions.  NEUROLOGIC:  Motor and sensation is grossly normal.  Cranial nerves are grossly intact. PSYCH:  Alert and oriented to person, place and time. Affect  is normal.  Laboratory Analysis: Labs from 10/31/2022: Sodium 136, potassium 4.0, chloride 107, CO2 25, BUN 15, creatinine 0.65.  Total bilirubin 0.5, AST 31, ALT 32, alkaline phosphatase 120, lipase 49, albumin 3.8.  WBC 6.3, hemoglobin 13.1, hematocrit 38.6, platelets 260.  Imaging: Ultrasound RUQ on 10/31/2022: IMPRESSION: 1. Cholelithiasis without sonographic evidence for acute cholecystitis. 2. 6 mm gallbladder polyp. 3. Echogenic liver parenchyma consistent with hepatic steatosis.  CT chest/abdomen/pelvis on 10/31/2022: IMPRESSION: 1. Slightly limited exam secondary to habitus and respiratory motion. No definite acute pulmonary embolus is seen. 2. Bilateral bandlike densities favored to represent atelectasis and or scarring. Heterogeneous parenchymal density with mosaicism, possible small airways disease. No consolidative pneumonia. 3. No CT evidence for acute intra-abdominal or pelvic abnormality.   Gallstones.  Assessment and Plan: This is a 52 y.o. female with symptomatic cholelithiasis.  - The patient has some issues with abdominal pain on her last admission towards the end of November/December but was thought to have issues with constipation rather than cholelithiasis.  Her bowel movements are better now with the use of different laxatives, but her abdominal pain remains.  On exam, she is tender in the right upper quadrant and somewhat in the mid abdomen as well.  Negative Murphy's sign.  I do think potentially her cholelithiasis is contributing to her abdominal discomfort.  There were no other findings in her abdomen on the CT scan to potentially giving her the same type of symptoms. - In light of this, discussed with the patient the recommendation for proceeding with cholecystectomy and discussed with her that this could be done via robotic assisted cholecystectomy.  Reviewed with her the surgery at length including the incisions, risks of bleeding, infection, injury to  surrounding structures, the use of ICG for better evaluation of the biliary anatomy, that this would be an outpatient procedure, postoperative activity restrictions, pain control, and she is willing to proceed. - The patient is also been having issues with left leg pain and she is currently scheduled to have surgery with orthopedic surgery on 12/13/2022.  Per the patient and her daughter, this would be a 2 part surgery.  I think for now would be better to proceed with the orthopedic side to help her with pain control and swelling in her leg and once she is recovered from the surgeries, we can then schedule her for her cholecystectomy.  In the  meantime, I think it would be prudent to do a low-fat diet to help trigger the gallbladder less and will also start her on ursodiol to help with her cholelithiasis. - The patient will follow-up with [place] after her surgeries and she will contact us to schedule an appointment.  I spent 40 minutes dedicated to the care of this patient on the date of this encounter to include pre-visit review of records, face-to-face time with the patient discussing diagnosis and management, and any post-visit coordination of care.   Melvyn Neth, [facility]

## 2022-12-13 HISTORY — PX: REMOVAL OF IMPLANT: SHX6451

## 2023-01-05 ENCOUNTER — Non-Acute Institutional Stay: Payer: Medicaid Other | Admitting: Nurse Practitioner

## 2023-01-05 ENCOUNTER — Encounter: Payer: Self-pay | Admitting: Nurse Practitioner

## 2023-01-05 DIAGNOSIS — G8191 Hemiplegia, unspecified affecting right dominant side: Secondary | ICD-10-CM

## 2023-01-05 DIAGNOSIS — Z515 Encounter for palliative care: Secondary | ICD-10-CM

## 2023-01-05 DIAGNOSIS — R5381 Other malaise: Secondary | ICD-10-CM

## 2023-01-05 DIAGNOSIS — I639 Cerebral infarction, unspecified: Secondary | ICD-10-CM

## 2023-01-05 DIAGNOSIS — R4701 Aphasia: Secondary | ICD-10-CM

## 2023-01-05 NOTE — Progress Notes (Addendum)
[facility] Pall[facility] Consult Note Telephone: [phone]  Fax: [phone]    Date of encounter: 01/05/23 7:44 PM PATIENT NAME: Gloria Gloria Aguilar [id number]   [id number] (home)  DOB: 02/17/1971 MRN: [id number] PRIMARY CARE PROVIDER:    [facility]  RESPONSIBLE PARTY:    Contact Information     Name Relation Home Work Mobile   Gloria Gloria Aguilar Daughter [phone]     Gloria Gloria Aguilar [phone]       I met face to face with patient in facility. Palliative Care was asked to follow this patient by consultation request of  Tarry Kos, Archie Endo, DO /[facility] to address advance care planning and complex medical decision making. This is the initial visit.                               ASSESSMENT AND PLAN / RECOMMENDATIONS:  Symptom Management/Plan: 1. Advance Care Planning;  Ongoing discussions 2. Goals of Care: Goals include to maximize quality of life and symptom management. Our advance care planning conversation included a discussion about:    The value and importance of advance care planning  Exploration of personal, cultural or spiritual beliefs that might influence medical decisions  Exploration of goals of care in the event of a sudden injury or illness  Identification and preparation of a healthcare agent  Review and updating or creation of an advance directive document. 3. Palliative care encounter; Palliative care encounter; Palliative medicine team will continue to support patient, patient's family, and medical team. Visit consisted of counseling and education dealing with the complex and emotionally intense issues of symptom management and palliative care in the setting of serious and potentially life-threatening illness   4. 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 Cut up Gloria Gloria Aguilar chicken on her try, observed while she used her fingers to feed herself. No s/s aspiration or choking. Praised Gloria Gloria Aguilar for independence as able.    Follow up Palliative Care Visit: Palliative care will continue to follow for complex medical decision making, advance care planning, and clarification of goals. Return 4 to 8 weeks or prn.   I spent 47 minutes providing this consultation starting at 12:15 pm. More than 50% of the time in this consultation was spent in counseling and care coordination. PPS: 40% Chief Complaint: Initial palliative consult for complex medical decision making, address goals, manage ongoing symptoms   HISTORY OF PRESENT ILLNESS:  Gloria Gloria Aguilar is a 52 y.o. 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 Gloria. Gloria Gloria Aguilar currently resides at [facility], requires assistance with transfers, mobility, adl's including dressing, bathing, toileting. Gloria. Gloria Aguilar requires assistance with feeding. Hospitalization 10/09/2022 for PNA. ED visit 10/31/2022 to 11/01/2022 for abdominal pain/CP workup significant for atypical chest pain. Saw Dr Hampton Abbot General sgy on 1/17/204 for symptomatic cholelithiasis with recommendation for proceeding with cholecystectomy and discussed with her that this could be done via robotic assisted cholecystectomy. Will have orthopedic complete their sgy then following once recovered will schedule sgy for gallbladder, add ursodiol to help with cholelithiasis. Imaging: Ultrasound RUQ on 10/31/2022: IMPRESSION: 1. Cholelithiasis without sonographic evidence for acute cholecystitis. 2. 6 mm gallbladder polyp. 3. Echogenic liver parenchyma consistent with hepatic steatosis.   CT chest/abdomen/pelvis  on 10/31/2022: IMPRESSION: 1. Slightly limited exam secondary to habitus and respiratory motion. No definite  acute pulmonary embolus is seen. 2. Bilateral bandlike densities favored to represent atelectasis and or scarring. Heterogeneous parenchymal density with mosaicism, possible small airways disease. No consolidative pneumonia. 3. No CT evidence for acute intra-abdominal or pelvic abnormality.   Gallstones. Hospitalized 12/13/2022 to 12/16/2022 for pain in bone fixation device; chronic pain syndrome. Gloria Gloria Aguilar has transitioned to LTC at [place]. Gloria Gloria Aguilar does require total care for transfers, mobility, adl's including bathing, dressing, tray setup. Gloria Gloria Aguilar does feed herself with her fingers. At present Gloria Gloria Aguilar is sitting in the w/c in her room, trying to feed herself lunch. Gloria Gloria Aguilar is aphasic so nods to answers, attempts to make sounds. Cut Gloria Gloria Aguilar meal up; she fed herself with finger foods. ROS negative. Gloria Gloria Aguilar appears stable, cooperative with assessment. No pain. Purpose of today PC f/u visit further discussion monitor trends of appetite, weights, monitor for functional, cognitive decline with chronic disease progression, assess any active symptoms, supportive role. Medications, poc, goc reviewed. No new recommendations today. Updated staff.    History obtained from review of EMR, discussion with primary team, and interview with family, facility staff/caregiver and/or Gloria. Gloria Aguilar.  I reviewed available labs, medications, imaging, studies and related documents from the EMR.  Records reviewed and summarized above.    Physical Exam: Constitutional: NAD General: obese, debilitated, female ENMT: oral mucous membranes moist CV: S1S2, RRR Pulmonary: LCTA Abdomen: normo-active BS + 4 quadrants, soft and non tender MSK: w/c dependent Skin: warm and dry Neuro:  + contracture right forearm/left foot; + cognitive impairment Psych: non-anxious affect, A and Oriented to self, place  Thank you for the opportunity to participate in the care of Gloria Gloria Aguilar. Please call our office at [phone] if we can be of  additional assistance.   Yuri Fana Ihor Gully, NP

## 2023-01-14 DIAGNOSIS — K8020 Calculus of gallbladder without cholecystitis without obstruction: Secondary | ICD-10-CM

## 2023-01-14 HISTORY — DX: Calculus of gallbladder without cholecystitis without obstruction: K80.20

## 2023-01-17 ENCOUNTER — Encounter: Payer: Self-pay | Admitting: Surgery

## 2023-01-17 ENCOUNTER — Ambulatory Visit (INDEPENDENT_AMBULATORY_CARE_PROVIDER_SITE_OTHER): Payer: Medicaid Other | Admitting: Surgery

## 2023-01-17 VITALS — BP 140/91 | HR 94 | Temp 98.0°F | Ht 63.0 in | Wt 230.0 lb

## 2023-01-17 DIAGNOSIS — K8020 Calculus of gallbladder without cholecystitis without obstruction: Secondary | ICD-10-CM | POA: Diagnosis not present

## 2023-01-17 NOTE — H&P (View-Only) (Signed)
01/17/2023  History of Present Illness: Gloria Aguilar is a 52 y.o. female presenting for follow up of symptomatic cholelithiasis.  She was last seen on 12/01/22 for her RUQ pain.  We had discussed that she was planning on having left leg surgery and we would follow up with her afterwards.  Her surgery went well and orthopedic surgery's note reports that she can proceed with gallbladder surgery now.  She has continued having RUQ pain since her last visit with me.  It has not worsened or improved despite of the ursodiol prescription given.  Denies any worsening episodes like what brought her to the ED in December 2023.  She's ready for surgery.  Past Medical History: Past Medical History:  Diagnosis Date   Anxiety    Depression    Diarrhea 10/09/2022   Dyspnea 10/13/2022   Stroke [facility])      Past Surgical History: Past Surgical History:  Procedure Laterality Date   COSMETIC SURGERY     EXTERNAL FIXATION LEG      Home Medications: Prior to Admission medications   Medication Sig Start Date End Date Taking? Authorizing Provider  acetaminophen (TYLENOL) 500 MG tablet Take 2 tablets (1,000 mg total) by mouth every 8 (eight) hours. 10/18/22  Yes Nicole Kindred A, DO  albuterol (VENTOLIN HFA) 108 (90 Base) MCG/ACT inhaler Inhale 2 puffs into the lungs every 4 (four) hours as needed for wheezing or shortness of breath. 10/04/22  Yes Lorella Nimrod, MD  allopurinol (ZYLOPRIM) 100 MG tablet Take 100 mg by mouth daily.   Yes [provider]  aspirin 81 MG EC tablet Take 1 tablet by mouth 3 (three) times daily.   Yes [provider]  busPIRone (BUSPAR) 5 MG tablet Take 5 mg by mouth 3 (three) times daily.   Yes [provider]  clonazePAM (KLONOPIN) 0.5 MG tablet Take 3 tablets (1.5 mg total) by mouth 2 (two) times daily as needed for anxiety. 10/18/22  Yes Ezekiel Slocumb, DO  dextromethorphan-guaiFENesin (MUCINEX DM) 30-600 MG 12hr tablet Take 1 tablet by mouth 2 (two) times  daily as needed for cough. 10/04/22  Yes Lorella Nimrod, MD  diclofenac Sodium (VOLTAREN) 1 % GEL Apply 2 g topically 2 (two) times daily.   Yes [provider]  dicyclomine (BENTYL) 10 MG capsule Take 1 capsule (10 mg total) by mouth 4 (four) times daily -  before meals and at bedtime. 10/18/22  Yes Nicole Kindred A, DO  diphenhydrAMINE (BENADRYL) 25 MG tablet Take 25 mg by mouth 2 (two) times daily as needed.   Yes [provider]  DULoxetine (CYMBALTA) 60 MG capsule Take 60 mg by mouth 2 (two) times daily. 08/18/21  Yes [provider]  gabapentin (NEURONTIN) 300 MG capsule Take 600 mg by mouth 3 (three) times daily. 10/12/21  Yes [provider]  lactulose (CHRONULAC) 10 GM/15ML solution Take 30 g by mouth daily as needed for mild constipation.   Yes [provider]  LORazepam (ATIVAN) 0.5 MG tablet Take 0.5 mg by mouth 2 (two) times daily.   Yes [provider]  methocarbamol (ROBAXIN) 750 MG tablet Take 1 tablet (750 mg total) by mouth every 8 (eight) hours as needed for muscle spasms. 10/16/22  Yes Nicole Kindred A, DO  mirtazapine (REMERON) 30 MG tablet Take 30 mg by mouth at bedtime. 06/15/22  Yes [provider]  Multiple Vitamins-Minerals (MULTIVITAMIN WITH MINERALS) tablet Take 1 tablet by mouth daily.   Yes [provider]  naloxegol oxalate (MOVANTIK) 25 MG TABS tablet Take 1 tablet (25 mg total) by mouth daily. 10/19/22  Yes Nicole Kindred A, DO  naloxone [facility]) nasal spray 4 mg/0.1 mL Place 1 spray into the nose as needed.   Yes [provider]  naproxen sodium (ALEVE) 220 MG tablet Take 220 mg by mouth daily as needed.   Yes [provider]  nicotine (NICODERM CQ - DOSED IN MG/24 HOURS) 21 mg/24hr patch Place 1 patch (21 mg total) onto the skin daily. 10/05/22  Yes Lorella Nimrod, MD  oxyCODONE-acetaminophen (PERCOCET/ROXICET) 5-325 MG tablet Take 1 tablet by mouth every 6 (six) hours as needed for  severe pain.   Yes [provider]  polyethylene glycol (MIRALAX / GLYCOLAX) 17 g packet Take 17 g by mouth daily as needed (constipation > 48 hours despite Movantik). 10/18/22  Yes Ezekiel Slocumb, DO  senna (SENOKOT) 8.6 MG TABS tablet Take 1 tablet by mouth daily as needed for mild constipation.   Yes [provider]  simethicone (MYLICON) 80 MG chewable tablet Chew 1 tablet (80 mg total) by mouth 4 (four) times daily as needed (abdominal gas pains or flatulence). 10/18/22  Yes Nicole Kindred A, DO  traMADol (ULTRAM) 50 MG tablet Take 1 tablet (50 mg total) by mouth every 6 (six) hours as needed for moderate pain (despite tylenol (use before oxycodone, reserve oxycodone for severe pain)). 10/18/22  Yes Nicole Kindred A, DO  traZODone (DESYREL) 150 MG tablet Take 150 mg by mouth at bedtime. 04/28/22  Yes [provider]  ursodiol (ACTIGALL) 300 MG capsule Take 1 capsule (300 mg total) by mouth 3 (three) times daily. 12/01/22  Yes Olean Ree, MD    Allergies: Allergies  Allergen Reactions   Cephalexin Hives   Levetiracetam Itching   Morphine    Wound Dressing Adhesive     Review of Systems: Review of Systems  Constitutional:  Negative for chills and fever.  HENT:  Negative for hearing loss.   Respiratory:  Negative for shortness of breath.   Cardiovascular:  Negative for chest pain.  Gastrointestinal:  Positive for abdominal pain, nausea and vomiting.  Genitourinary:  Negative for dysuria.  Musculoskeletal:  Positive for joint pain.  Skin:  Negative for rash.  Neurological:  Negative for dizziness.  Psychiatric/Behavioral:  Negative for depression.     Physical Exam BP (!) 140/91   Pulse 94   Temp 98 F (36.7 C)   Ht '5\' 3"'$  (1.6 m)   Wt 230 lb (104.3 kg)   LMP 12/01/2019   SpO2 98%   BMI 40.74 kg/m  CONSTITUTIONAL: No acute distress HEENT:  Normocephalic, atraumatic, extraocular motion intact. NECK:  Trachea is midline, no jugular venous  distention. RESPIRATORY:  Lungs are clear, and breath sounds are equal bilaterally. Normal respiratory effort without pathologic use of accessory muscles. CARDIOVASCULAR: Heart is regular without murmurs, gallops, or rubs. GI: The abdomen is soft, non-distended, with tenderness in RUQ but negative Murphy's sign.  MUSCULOSKELETAL:  Currently on a wheelchair, has hemiplegia. NEUROLOGIC:  Unable to assess PSYCH:  Alert and oriented to person, place and time. Affect is normal.  Assessment and Plan: This is a 52 y.o. female with symptomatic cholelithiasis.  --Patient is recovering well from her orthopedic surgery for her left leg.  She is due to left knee surgery in a couple of months, but has been cleared by ortho to proceed with gallbladder surgery.  The patient continues to have episodes of pain and have not  improved with the ursodiol.  As such, discussed with her proceeding with robotic assisted cholecystectomy.  --Reviewed the surgery at length with her including the planned incisions, the risks of bleeding, infection, injury to surrounding structures, the use of ICG for evaluation of biliary anatomy, that this would be an outpatient surgery, pain control, activity restrictions, and she's willing to proceed. --Will schedule her for surgery on 02/01/23.  Will also send for medical clearance.  Last dose of Aspirin would be on 01/26/23.  I spent 40 minutes dedicated to the care of this patient on the date of this encounter to include pre-visit review of records, face-to-face time with the patient discussing diagnosis and management, and any post-visit coordination of care.   Melvyn Neth, [facility]

## 2023-01-17 NOTE — Progress Notes (Unsigned)
01/17/2023  History of Present Illness: Gloria Aguilar is a 52 y.o. female presenting for follow up of symptomatic cholelithiasis.  She was last seen on 12/01/22 for her RUQ pain.  We had discussed that she was planning on having left leg surgery and we would follow up with her afterwards.  Her surgery went well and orthopedic surgery's note reports that she can proceed with gallbladder surgery now.  She has continued having RUQ pain since her last visit with me.  It has not worsened or improved despite of the ursodiol prescription given.  Denies any worsening episodes like what brought her to the ED in December 2023.  She's ready for surgery.  Past Medical History: Past Medical History:  Diagnosis Date   Anxiety    Depression    Diarrhea 10/09/2022   Dyspnea 10/13/2022   Stroke [facility])      Past Surgical History: Past Surgical History:  Procedure Laterality Date   COSMETIC SURGERY     EXTERNAL FIXATION LEG      Home Medications: Prior to Admission medications   Medication Sig Start Date End Date Taking? Authorizing Provider  acetaminophen (TYLENOL) 500 MG tablet Take 2 tablets (1,000 mg total) by mouth every 8 (eight) hours. 10/18/22  Yes Nicole Kindred A, DO  albuterol (VENTOLIN HFA) 108 (90 Base) MCG/ACT inhaler Inhale 2 puffs into the lungs every 4 (four) hours as needed for wheezing or shortness of breath. 10/04/22  Yes Lorella Nimrod, MD  allopurinol (ZYLOPRIM) 100 MG tablet Take 100 mg by mouth daily.   Yes [provider]  aspirin 81 MG EC tablet Take 1 tablet by mouth 3 (three) times daily.   Yes [provider]  busPIRone (BUSPAR) 5 MG tablet Take 5 mg by mouth 3 (three) times daily.   Yes [provider]  clonazePAM (KLONOPIN) 0.5 MG tablet Take 3 tablets (1.5 mg total) by mouth 2 (two) times daily as needed for anxiety. 10/18/22  Yes Ezekiel Slocumb, DO  dextromethorphan-guaiFENesin (MUCINEX DM) 30-600 MG 12hr tablet Take 1 tablet by mouth 2 (two) times  daily as needed for cough. 10/04/22  Yes Lorella Nimrod, MD  diclofenac Sodium (VOLTAREN) 1 % GEL Apply 2 g topically 2 (two) times daily.   Yes [provider]  dicyclomine (BENTYL) 10 MG capsule Take 1 capsule (10 mg total) by mouth 4 (four) times daily -  before meals and at bedtime. 10/18/22  Yes Nicole Kindred A, DO  diphenhydrAMINE (BENADRYL) 25 MG tablet Take 25 mg by mouth 2 (two) times daily as needed.   Yes [provider]  DULoxetine (CYMBALTA) 60 MG capsule Take 60 mg by mouth 2 (two) times daily. 08/18/21  Yes [provider]  gabapentin (NEURONTIN) 300 MG capsule Take 600 mg by mouth 3 (three) times daily. 10/12/21  Yes [provider]  lactulose (CHRONULAC) 10 GM/15ML solution Take 30 g by mouth daily as needed for mild constipation.   Yes [provider]  LORazepam (ATIVAN) 0.5 MG tablet Take 0.5 mg by mouth 2 (two) times daily.   Yes [provider]  methocarbamol (ROBAXIN) 750 MG tablet Take 1 tablet (750 mg total) by mouth every 8 (eight) hours as needed for muscle spasms. 10/16/22  Yes Nicole Kindred A, DO  mirtazapine (REMERON) 30 MG tablet Take 30 mg by mouth at bedtime. 06/15/22  Yes [provider]  Multiple Vitamins-Minerals (MULTIVITAMIN WITH MINERALS) tablet Take 1 tablet by mouth daily.   Yes [provider]  naloxegol oxalate (MOVANTIK) 25 MG TABS tablet Take 1 tablet (25 mg total) by mouth daily. 10/19/22  Yes Nicole Kindred A, DO  naloxone [facility]) nasal spray 4 mg/0.1 mL Place 1 spray into the nose as needed.   Yes [provider]  naproxen sodium (ALEVE) 220 MG tablet Take 220 mg by mouth daily as needed.   Yes [provider]  nicotine (NICODERM CQ - DOSED IN MG/24 HOURS) 21 mg/24hr patch Place 1 patch (21 mg total) onto the skin daily. 10/05/22  Yes Lorella Nimrod, MD  oxyCODONE-acetaminophen (PERCOCET/ROXICET) 5-325 MG tablet Take 1 tablet by mouth every 6 (six) hours as needed for  severe pain.   Yes [provider]  polyethylene glycol (MIRALAX / GLYCOLAX) 17 g packet Take 17 g by mouth daily as needed (constipation > 48 hours despite Movantik). 10/18/22  Yes Ezekiel Slocumb, DO  senna (SENOKOT) 8.6 MG TABS tablet Take 1 tablet by mouth daily as needed for mild constipation.   Yes [provider]  simethicone (MYLICON) 80 MG chewable tablet Chew 1 tablet (80 mg total) by mouth 4 (four) times daily as needed (abdominal gas pains or flatulence). 10/18/22  Yes Nicole Kindred A, DO  traMADol (ULTRAM) 50 MG tablet Take 1 tablet (50 mg total) by mouth every 6 (six) hours as needed for moderate pain (despite tylenol (use before oxycodone, reserve oxycodone for severe pain)). 10/18/22  Yes Nicole Kindred A, DO  traZODone (DESYREL) 150 MG tablet Take 150 mg by mouth at bedtime. 04/28/22  Yes [provider]  ursodiol (ACTIGALL) 300 MG capsule Take 1 capsule (300 mg total) by mouth 3 (three) times daily. 12/01/22  Yes Olean Ree, MD    Allergies: Allergies  Allergen Reactions   Cephalexin Hives   Levetiracetam Itching   Morphine    Wound Dressing Adhesive     Review of Systems: Review of Systems  Constitutional:  Negative for chills and fever.  HENT:  Negative for hearing loss.   Respiratory:  Negative for shortness of breath.   Cardiovascular:  Negative for chest pain.  Gastrointestinal:  Positive for abdominal pain, nausea and vomiting.  Genitourinary:  Negative for dysuria.  Musculoskeletal:  Positive for joint pain.  Skin:  Negative for rash.  Neurological:  Negative for dizziness.  Psychiatric/Behavioral:  Negative for depression.     Physical Exam BP (!) 140/91   Pulse 94   Temp 98 F (36.7 C)   Ht '5\' 3"'$  (1.6 m)   Wt 230 lb (104.3 kg)   LMP 12/01/2019   SpO2 98%   BMI 40.74 kg/m  CONSTITUTIONAL: No acute distress HEENT:  Normocephalic, atraumatic, extraocular motion intact. NECK:  Trachea is midline, no jugular venous  distention. RESPIRATORY:  Lungs are clear, and breath sounds are equal bilaterally. Normal respiratory effort without pathologic use of accessory muscles. CARDIOVASCULAR: Heart is regular without murmurs, gallops, or rubs. GI: The abdomen is soft, non-distended, with tenderness in RUQ but negative Murphy's sign.  MUSCULOSKELETAL:  Currently on a wheelchair, has hemiplegia. NEUROLOGIC:  Unable to assess PSYCH:  Alert and oriented to person, place and time. Affect is normal.  Assessment and Plan: This is a 52 y.o. female with symptomatic cholelithiasis.  --Patient is recovering well from her orthopedic surgery for her left leg.  She is due to left knee surgery in a couple of months, but has been cleared by ortho to proceed with gallbladder surgery.  The patient continues to have episodes of pain and have not  improved with the ursodiol.  As such, discussed with her proceeding with robotic assisted cholecystectomy.  --Reviewed the surgery at length with her including the planned incisions, the risks of bleeding, infection, injury to surrounding structures, the use of ICG for evaluation of biliary anatomy, that this would be an outpatient surgery, pain control, activity restrictions, and she's willing to proceed. --Will schedule her for surgery on 02/01/23.  Will also send for medical clearance.  Last dose of Aspirin would be on 01/26/23.  I spent 40 minutes dedicated to the care of this patient on the date of this encounter to include pre-visit review of records, face-to-face time with the patient discussing diagnosis and management, and any post-visit coordination of care.   Melvyn Neth, [facility]

## 2023-01-17 NOTE — Progress Notes (Signed)
Request for Medical Clearance sent to patient's facility where she resides at [facility]. To be filled out by her primary care provider there and faxed back to [place].

## 2023-01-17 NOTE — Patient Instructions (Signed)
You have requested to have your gallbladder removed. This will be done at [facility] on 01/27/23 with Dr. Hampton Abbot.  You will need to stop your Aspirin 5 days prior to surgery. Your last dose will be on 01/26/23.  You will most likely be out of work 1-2 weeks for this surgery.  If you have FMLA or disability paperwork that needs filled out you may drop this off at our office or this can be faxed to [phone].  You will return after your post-op appointment with a lifting restriction for approximately 4 more weeks.  You will be able to eat anything you would like to following surgery. But, start by eating a bland diet and advance this as tolerated. The Gallbladder diet is below, please go as closely by this diet as possible prior to surgery to avoid any further attacks.  Please see the (blue)pre-care form that you have been given today. Our surgery scheduler will call you to verify surgery date and to go over information.   If you have any questions, please call our office.  Laparoscopic Cholecystectomy Laparoscopic cholecystectomy is surgery to remove the gallbladder. The gallbladder is located in the upper right part of the abdomen, behind the liver. It is a storage sac for bile, which is produced in the liver. Bile aids in the digestion and absorption of fats. Cholecystectomy is often done for inflammation of the gallbladder (cholecystitis). This condition is usually caused by a buildup of gallstones (cholelithiasis) in the gallbladder. Gallstones can block the flow of bile, and that can result in inflammation and pain. In severe cases, emergency surgery may be required. If emergency surgery is not required, you will have time to prepare for the procedure. Laparoscopic surgery is an alternative to open surgery. Laparoscopic surgery has a shorter recovery time. Your common bile duct may also need to be examined during the procedure. If stones are found in the common bile duct, they may be  removed. LET [facility] CARE PROVIDER KNOW ABOUT: Any allergies you have. All medicines you are taking, including vitamins, herbs, eye drops, creams, and over-the-counter medicines. Previous problems you or members of your family have had with the use of anesthetics. Any blood disorders you have. Previous surgeries you have had.  Any medical conditions you have. RISKS AND COMPLICATIONS Generally, this is a safe procedure. However, problems may occur, including: Infection. Bleeding. Allergic reactions to medicines. Damage to other structures or organs. A stone remaining in the common bile duct. A bile leak from the cyst duct that is clipped when your gallbladder is removed. The need to convert to open surgery, which requires a larger incision in the abdomen. This may be necessary if your surgeon thinks that it is not safe to continue with a laparoscopic procedure. BEFORE THE PROCEDURE Ask your health care provider about: Changing or stopping your regular medicines. This is especially important if you are taking diabetes medicines or blood thinners. Taking medicines such as aspirin and ibuprofen. These medicines can thin your blood. Do not take these medicines before your procedure if your health care provider instructs you not to. Follow instructions from your health care provider about eating or drinking restrictions. Let your health care provider know if you develop a cold or an infection before surgery. Plan to have someone take you home after the procedure. Ask your health care provider how your surgical site will be marked or identified. You may be given antibiotic medicine to help prevent infection. PROCEDURE To reduce your  risk of infection: Your health care team will wash or sanitize their hands. Your skin will be washed with soap. An IV tube may be inserted into one of your veins. You will be given a medicine to make you fall asleep (general anesthetic). A breathing tube  will be placed in your mouth. The surgeon will make several small cuts (incisions) in your abdomen. A thin, lighted tube (laparoscope) that has a tiny camera on the end will be inserted through one of the small incisions. The camera on the laparoscope will send a picture to a TV screen (monitor) in the operating room. This will give the surgeon a good view inside your abdomen. A gas will be pumped into your abdomen. This will expand your abdomen to give the surgeon more room to perform the surgery. Other tools that are needed for the procedure will be inserted through the other incisions. The gallbladder will be removed through one of the incisions. After your gallbladder has been removed, the incisions will be closed with stitches (sutures), staples, or skin glue. Your incisions may be covered with a bandage (dressing). The procedure may vary among health care providers and hospitals. AFTER THE PROCEDURE Your blood pressure, heart rate, breathing rate, and blood oxygen level will be monitored often until the medicines you were given have worn off. You will be given medicines as needed to control your pain.   This information is not intended to replace advice given to you by your health care provider. Make sure you discuss any questions you have with your health care provider.   Document Released: 11/01/2005 Document Revised: 07/23/2015 Document Reviewed: 06/13/2013 Elsevier Interactive Patient Education 2016 [place] Diet for Gallbladder Conditions A low-fat diet can be helpful if you have pancreatitis or a gallbladder condition. With these conditions, your pancreas and gallbladder have trouble digesting fats. A healthy eating plan with less fat will help rest your pancreas and gallbladder and reduce your symptoms. WHAT DO I NEED TO KNOW ABOUT THIS DIET? Eat a low-fat diet. Reduce your fat intake to less than 20-30% of your total daily calories. This is less than 50-60 g of fat  per day. Remember that you need some fat in your diet. Ask your dietician what your daily goal should be. Choose nonfat and low-fat healthy foods. Look for the words "nonfat," "low fat," or "fat free." As a guide, look on the label and choose foods with less than 3 g of fat per serving. Eat only one serving. Avoid alcohol. Do not smoke. If you need help quitting, talk with your health care provider. Eat small frequent meals instead of three large heavy meals. WHAT FOODS CAN I EAT? Grains Include healthy grains and starches such as potatoes, wheat bread, fiber-rich cereal, and brown rice. Choose whole grain options whenever possible. In adults, whole grains should account for 45-65% of your daily calories.  Fruits and Vegetables Eat plenty of fruits and vegetables. Fresh fruits and vegetables add fiber to your diet. Meats and Other Protein Sources Eat lean meat such as chicken and pork. Trim any fat off of meat before cooking it. Eggs, fish, and beans are other sources of protein. In adults, these foods should account for 10-35% of your daily calories. Dairy Choose low-fat milk and dairy options. Dairy includes fat and protein, as well as calcium.  Fats and Oils Limit high-fat foods such as fried foods, sweets, baked goods, sugary drinks.  Other Creamy sauces and condiments, such as mayonnaise,  can add extra fat. Think about whether or not you need to use them, or use smaller amounts or low fat options. WHAT FOODS ARE NOT RECOMMENDED? High fat foods, such as: [employer]. Ice cream. [place] toast. Sweet rolls. Pizza. Cheese bread. Foods covered with batter, butter, creamy sauces, or cheese. Fried foods. Sugary drinks and desserts. Foods that cause gas or bloating   This information is not intended to replace advice given to you by your health care provider. Make sure you discuss any questions you have with your health care provider.   Document Released: 11/06/2013 Document Reviewed:  11/06/2013 Elsevier Interactive Patient Education [employer].

## 2023-01-18 ENCOUNTER — Telehealth: Payer: Self-pay | Admitting: Surgery

## 2023-01-18 NOTE — Telephone Encounter (Signed)
Spoke with daughter, Jonelle Sidle, she is informed of the following regarding scheduled surgery with Dr. Hampton Abbot.    Pre-Admission date/time, and Surgery date at [facility].  Surgery Date: 02/01/23 Preadmission Testing Date: 01/24/23 (phone 8a-1p)  She has been made aware to call [phone], between 1-3:00pm the day before surgery, to find out what time to arrive for surgery.

## 2023-01-24 ENCOUNTER — Emergency Department: Payer: Medicaid Other

## 2023-01-24 ENCOUNTER — Inpatient Hospital Stay
Admission: RE | Admit: 2023-01-24 | Discharge: 2023-01-24 | Disposition: A | Discharge status: Home / Self Care | Payer: Medicaid Other | Source: Ambulatory Visit

## 2023-01-24 ENCOUNTER — Other Ambulatory Visit: Payer: Self-pay | Admitting: Radiology

## 2023-01-24 ENCOUNTER — Encounter: Payer: Self-pay | Admitting: Emergency Medicine

## 2023-01-24 ENCOUNTER — Emergency Department
Admission: EM | Admit: 2023-01-24 | Discharge: 2023-01-24 | Disposition: A | Discharge status: Home / Self Care | Payer: Medicaid Other | Attending: Student in an Organized Health Care Education/Training Program | Admitting: Student in an Organized Health Care Education/Training Program

## 2023-01-24 ENCOUNTER — Other Ambulatory Visit: Payer: Self-pay

## 2023-01-24 DIAGNOSIS — R519 Headache, unspecified: Secondary | ICD-10-CM | POA: Insufficient documentation

## 2023-01-24 DIAGNOSIS — S83105A Unspecified dislocation of left knee, initial encounter: Secondary | ICD-10-CM | POA: Insufficient documentation

## 2023-01-24 DIAGNOSIS — Z1152 Encounter for screening for COVID-19: Secondary | ICD-10-CM | POA: Diagnosis not present

## 2023-01-24 DIAGNOSIS — W19XXXA Unspecified fall, initial encounter: Secondary | ICD-10-CM | POA: Insufficient documentation

## 2023-01-24 DIAGNOSIS — M79605 Pain in left leg: Secondary | ICD-10-CM | POA: Diagnosis present

## 2023-01-24 LAB — COMPREHENSIVE METABOLIC PANEL
ALT: 36 U/L (ref 0–44)
AST: 32 U/L (ref 15–41)
Albumin: 3.6 g/dL (ref 3.5–5.0)
Alkaline Phosphatase: 180 U/L — ABNORMAL HIGH (ref 38–126)
Anion gap: 6 (ref 5–15)
BUN: 20 mg/dL (ref 6–20)
CO2: 26 mmol/L (ref 22–32)
Calcium: 8.7 mg/dL — ABNORMAL LOW (ref 8.9–10.3)
Chloride: 106 mmol/L (ref 98–111)
Creatinine, Ser: 0.6 mg/dL (ref 0.44–1.00)
GFR, Estimated: >60 mL/min (ref >60)
Glucose, Bld: 101 mg/dL — ABNORMAL HIGH (ref 70–99)
Potassium: 3.5 mmol/L (ref 3.5–5.1)
Sodium: 138 mmol/L (ref 135–145)
Total Bilirubin: 0.6 mg/dL (ref 0.3–1.2)
Total Protein: 7.2 g/dL (ref 6.5–8.1)

## 2023-01-24 LAB — CBC WITH DIFFERENTIAL/PLATELET
Abs Immature Granulocytes: 0.01 10*3/uL (ref 0.00–0.07)
Basophils Absolute: 0 10*3/uL (ref 0.0–0.1)
Basophils Relative: 0 %
Eosinophils Absolute: 0.3 10*3/uL (ref 0.0–0.5)
Eosinophils Relative: 5 %
HCT: 34.7 % — ABNORMAL LOW (ref 36.0–46.0)
Hemoglobin: 11.7 g/dL — ABNORMAL LOW (ref 12.0–15.0)
Immature Granulocytes: 0 %
Lymphocytes Relative: 22 %
Lymphs Abs: 1.3 10*3/uL (ref 0.7–4.0)
MCH: 30.2 pg (ref 26.0–34.0)
MCHC: 33.7 g/dL (ref 30.0–36.0)
MCV: 89.7 fL (ref 80.0–100.0)
Monocytes Absolute: 0.6 10*3/uL (ref 0.1–1.0)
Monocytes Relative: 10 %
Neutro Abs: 3.5 10*3/uL (ref 1.7–7.7)
Neutrophils Relative %: 63 %
Platelets: 269 10*3/uL (ref 150–400)
RBC: 3.87 MIL/uL (ref 3.87–5.11)
RDW: 14.6 % (ref 11.5–15.5)
WBC: 5.6 10*3/uL (ref 4.0–10.5)
nRBC: 0 % (ref 0.0–0.2)

## 2023-01-24 LAB — RESP PANEL BY RT-PCR (RSV, FLU A&B, COVID)  RVPGX2
Influenza A by PCR: NEGATIVE
Influenza B by PCR: NEGATIVE
Resp Syncytial Virus by PCR: NEGATIVE
SARS Coronavirus 2 by RT PCR: NEGATIVE

## 2023-01-24 MED ORDER — OXYCODONE-ACETAMINOPHEN 5-325 MG PO TABS
1.0000 | ORAL_TABLET | Freq: Once | ORAL | Status: AC
  Administered 2023-01-24: 1 via ORAL
  Filled 2023-01-24: qty 1

## 2023-01-24 MED ORDER — OXYCODONE-ACETAMINOPHEN 5-325 MG PO TABS: 1.0000 | ORAL_TABLET | Freq: Four times a day (QID) | ORAL | 0 refills | Status: DC | PRN

## 2023-01-24 NOTE — Patient Instructions (Addendum)
Your procedure is scheduled TD:[id number] February 01, 2023. Report to the Registration Desk on the 1st floor of the [facility]. To find out your arrival time, please call [phone] between 1PM - 3PM on: Monday January 31, 2023. If your arrival time is 6:00 am, do not arrive before that time as the [facility] entrance doors do not open until 6:00 am.  REMEMBER: Instructions that are not followed completely may result in serious medical risk, up to and including death; or upon the discretion of your surgeon and anesthesiologist your surgery may need to be rescheduled.  Do not eat food after midnight the night before surgery.  No gum chewing or hard candies.  You may however, drink CLEAR liquids up to 2 hours before you are scheduled to arrive for your surgery. Do not drink anything within 2 hours of your scheduled arrival time.  Clear liquids include: - water  - apple juice without pulp - gatorade (not RED colors) - black coffee or tea (Do NOT add milk or creamers to the coffee or tea) Do NOT drink anything that is not on this list.  Type 1 and Type 2 diabetics should only drink water.  In addition, your doctor has ordered for you to drink the provided:  Ensure Pre-Surgery Clear Carbohydrate Drink  Gatorade G2 Drinking this carbohydrate drink up to two hours before surgery helps to reduce insulin resistance and improve patient outcomes. Please complete drinking 2 hours before scheduled arrival time.  One week prior to surgery: Stop Anti-inflammatories (NSAIDS) such as Advil, Aleve, Ibuprofen, Motrin, Naproxen, Naprosyn and Aspirin based products such as Excedrin, Goody's Powder, BC Powder. Stop ANY OVER THE COUNTER supplements until after surgery. You may however, continue to take Tylenol if needed for pain up until the day of surgery.  Continue taking all prescribed medications with the exception of the following:  **Follow guidelines for insulin and diabetes  medications**  Follow recommendations from Cardiologist or PCP regarding stopping blood thinners.  TAKE ONLY THESE MEDICATIONS THE MORNING OF SURGERY WITH A SIP OF WATER:  allopurinol (ZYLOPRIM) 100 MG  busPIRone (BUSPAR) 5 MG  DULoxetine (CYMBALTA) 60 MG  gabapentin (NEURONTIN) 300 MG    Use inhalers on the day of surgery and bring to the hospital. albuterol (VENTOLIN HFA) 108 (90 Base) MCG/ACT inhaler  Fleets enema or bowel prep as directed.  No Alcohol for 24 hours before or after surgery.  No Smoking including e-cigarettes for 24 hours before surgery.  No chewable tobacco products for at least 6 hours before surgery.  No nicotine patches on the day of surgery.  Do not use any "recreational" drugs for at least a week (preferably 2 weeks) before your surgery.  Please be advised that the combination of cocaine and anesthesia may have negative outcomes, up to and including death. If you test positive for cocaine, your surgery will be cancelled.  On the morning of surgery brush your teeth with toothpaste and water, you may rinse your mouth with mouthwash if you wish. Do not swallow any toothpaste or mouthwash.  Use CHG Soap or wipes as directed on instruction sheet.  Do not wear jewelry, make-up, hairpins, clips or nail polish.  Do not wear lotions, powders, or perfumes.   Do not shave body hair from the neck down 48 hours before surgery.  Contact lenses, hearing aids and dentures may not be worn into surgery.  Do not bring valuables to the hospital. [facility] is not responsible for any missing/lost belongings  or valuables.   Total Shoulder Arthroplasty:  use Benzoyl Peroxide 5% Gel as directed on instruction sheet.  Bring your C-PAP to the hospital in case you may have to spend the night.   Notify your doctor if there is any change in your medical condition (cold, fever, infection).  Wear comfortable clothing (specific to your surgery type) to the hospital.  After  surgery, you can help prevent lung complications by doing breathing exercises.  Take deep breaths and cough every 1-2 hours. Your doctor may order a device called an Incentive Spirometer to help you take deep breaths. When coughing or sneezing, hold a pillow firmly against your incision with both hands. This is called "splinting." Doing this helps protect your incision. It also decreases belly discomfort.  If you are being admitted to the hospital overnight, leave your suitcase in the car. After surgery it may be brought to your room.  In case of increased patient census, it may be necessary for you, the patient, to continue your postoperative care in the Same Day Surgery department.  If you are being discharged the day of surgery, you will not be allowed to drive home. You will need a responsible individual to drive you home and stay with you for 24 hours after surgery.   If you are taking public transportation, you will need to have a responsible individual with you.  Please call the [place] Dept. at [phone] if you have any questions about these instructions.  Surgery Visitation Policy:  Patients undergoing a surgery or procedure may have two family members or support persons with them as long as the person is not COVID-19 positive or experiencing its symptoms.   Inpatient Visitation:    Visiting hours are 7 a.m. to 8 p.m. Up to four visitors are allowed at one time in a patient room. The visitors may rotate out with other people during the day. One designated support person (adult) may remain overnight.  Due to an increase in RSV and influenza rates and associated hospitalizations, children ages 58 and under will not be able to visit patients in [facility]. Masks continue to be strongly recommended.     Preparing for Surgery with CHLORHEXIDINE GLUCONATE (CHG) Soap  Chlorhexidine Gluconate (CHG) Soap  o An antiseptic cleaner that kills germs and  bonds with the skin to continue killing germs even after washing  o Used for showering the night before surgery and morning of surgery  Before surgery, you can play an important role by reducing the number of germs on your skin.  CHG (Chlorhexidine gluconate) soap is an antiseptic cleanser which kills germs and bonds with the skin to continue killing germs even after washing.  Please do not use if you have an allergy to CHG or antibacterial soaps. If your skin becomes reddened/irritated stop using the CHG.  1. Shower the NIGHT BEFORE SURGERY and the MORNING OF SURGERY with CHG soap.  2. If you choose to wash your hair, wash your hair first as usual with your normal shampoo.  3. After shampooing, rinse your hair and body thoroughly to remove the shampoo.  4. Use CHG as you would any other liquid soap. You can apply CHG directly to the skin and wash gently with a scrungie or a clean washcloth.  5. Apply the CHG soap to your body only from the neck down. Do not use on open wounds or open sores. Avoid contact with your eyes, ears, mouth, and genitals (private parts). Wash  face and genitals (private parts) with your normal soap.  6. Wash thoroughly, paying special attention to the area where your surgery will be performed.  7. Thoroughly rinse your body with warm water.  8. Do not shower/wash with your normal soap after using and rinsing off the CHG soap.  9. Pat yourself dry with a clean towel.  10. Wear clean pajamas to bed the night before surgery.  12. Place clean sheets on your bed the night of your first shower and do not sleep with pets.  13. Shower again with the CHG soap on the day of surgery prior to arriving at the hospital.  14. Do not apply any deodorants/lotions/powders.  15. Please wear clean clothes to the hospital.

## 2023-01-24 NOTE — ED Triage Notes (Signed)
Presents via EMS s/p fall  Per EMS she fell yesterday  Now having headache  Also some pain to left knee/hip area

## 2023-01-24 NOTE — Discharge Instructions (Signed)
Call [facility] for close outpatient follow-up.

## 2023-01-24 NOTE — ED Provider Notes (Signed)
[facility] Provider Note    Event Date/Time   First MD Initiated Contact with Patient 01/24/23 [phone]     (approximate)   History   Fall   HPI  Gloria Aguilar is a 52 y.o. female extensive past medical history presents to the ER for evaluation of left leg pain occurred after fall yesterday.  Is complaining of headache has had some cough.  No chest pain no shortness of breath.  No known sick contacts.  Is planning to have total knee replacement of the left in a month.     Physical Exam   Triage Vital Signs: ED Triage Vitals  Enc Vitals Group     BP 01/24/23 0924 125/71     Pulse Rate 01/24/23 0924 96     Resp 01/24/23 0924 16     Temp 01/24/23 0924 98.1 F (36.7 C)     Temp Source 01/24/23 0924 Oral     SpO2 01/24/23 0924 94 %     Weight 01/24/23 0925 231 lb 7.7 oz (105 kg)     Height 01/24/23 0925 '5\' 3"'$  (1.6 m)     Head Circumference --      Peak Flow --      Pain Score 01/24/23 0925 7     Pain Loc --      Pain Edu? --      Excl. in [place]? --     Most recent vital signs: Vitals:   01/24/23 0924 01/24/23 1340  BP: 125/71 120/70  Pulse: 96 80  Resp: 16 16  Temp: 98.1 F (36.7 C) 98 F (36.7 C)  SpO2: 94% 94%     Constitutional: Alert, chronically ill-appearin Eyes: Conjunctivae are normal.  Head: Atraumatic. Nose: No congestion/rhinnorhea. Mouth/Throat: Mucous membranes are moist.   Neck: Painless ROM.  Cardiovascular:   Good peripheral circulation. Respiratory: Normal respiratory effort.  No retractions.  Gastrointestinal: Soft and nontender drains. Musculoskeletal: Swelling of the left knee.  Trace effusion.  Neurovascular intact distally. Neurologic:  MAE spontaneously. No gross focal neurologic deficits are appreciated.  Skin:  Skin is warm, dry and intact. No rash noted. Psychiatric: Mood and affect are normal. Speech and behavior are normal.    ED Results / Procedures / Treatments   Labs (all labs ordered are listed, but  only abnormal results are displayed) Labs Reviewed  CBC WITH DIFFERENTIAL/PLATELET - Abnormal; Notable for the following components:      Result Value   Hemoglobin 11.7 (*)    HCT 34.7 (*)    All other components within normal limits  COMPREHENSIVE METABOLIC PANEL - Abnormal; Notable for the following components:   Glucose, Bld 101 (*)    Calcium 8.7 (*)    Alkaline Phosphatase 180 (*)    All other components within normal limits  RESP PANEL BY RT-PCR (RSV, FLU A&B, COVID)  [id number]     EKG  ED ECG REPORT I, Merlyn Lot, the attending physician, personally viewed and interpreted this ECG.   Date: 01/24/2023  EKG Time: 10:08  Rate: 85  Rhythm: sinus  Axis: normal  Intervals: normal  ST&T Change: no stemi    RADIOLOGY Please see ED Course for my review and interpretation.  I personally reviewed all radiographic images ordered to evaluate for the above acute complaints and reviewed radiology reports and findings.  These findings were personally discussed with the patient.  Please see medical record for radiology report.    PROCEDURES:  Critical Care performed: No  Procedures   MEDICATIONS ORDERED IN ED: Medications  oxyCODONE-acetaminophen (PERCOCET/ROXICET) 5-325 MG per tablet 1 tablet (1 tablet Oral Given 01/24/23 1148)     IMPRESSION / MDM / ASSESSMENT AND PLAN / ED COURSE  I reviewed the triage vital signs and the nursing notes.                              Differential diagnosis includes, but is not limited to, fracture, dislocation, contusion, SDH, electrolyte abnormality, anemia  Patient presenting to the ER for evaluation of symptoms as described above.  Based on symptoms, risk factors and considered above differential, this presenting complaint could reflect a potentially life-threatening illness therefore the patient will be placed on continuous pulse oximetry and telemetry for monitoring.  Laboratory evaluation will be sent to evaluate for the above  complaints.     Clinical Course as of 01/24/23 1405  Mon Jan 24, 2023  1008 X-ray on my review and interpretation does not show evidence of acute fracture.  Will await formal radiology report. [PR]  [id number] CT without evidence of tibial plateau fracture.  Patient does have posterior dislocation of tibia on femur is noted but this does appear to be chronic when compared to imaging from November.  Will be placed in knee immobilizer.  She is neurovascular intact.  Remainder of workup is reassuring.  Will be given prescription for additional pain medication.  Instructed to follow-up with orthopedics at [facility]. [PR]    Clinical Course User Index [PR] Merlyn Lot, MD     FINAL CLINICAL IMPRESSION(S) / ED DIAGNOSES   Final diagnoses:  Dislocation of left knee, initial encounter     Rx / DC Orders   ED Discharge Orders          Ordered    oxyCODONE-acetaminophen (PERCOCET/ROXICET) 5-325 MG tablet  Every 6 hours PRN        01/24/23 1357             Note:  This document was prepared using Dragon voice recognition software and may include unintentional dictation errors.    Merlyn Lot, MD 01/24/23 [phone]

## 2023-01-25 ENCOUNTER — Encounter
Admission: RE | Admit: 2023-01-25 | Discharge: 2023-01-25 | Disposition: A | Discharge status: Home / Self Care | Payer: Medicaid Other | Source: Ambulatory Visit | Attending: Surgery | Admitting: Surgery

## 2023-01-25 HISTORY — DX: Unspecified osteoarthritis, unspecified site: M19.90

## 2023-01-25 HISTORY — DX: Irritable bowel syndrome, unspecified: K58.9

## 2023-01-25 HISTORY — DX: Gastritis, unspecified, without bleeding: K29.70

## 2023-01-25 HISTORY — DX: Gout, unspecified: M10.9

## 2023-01-25 HISTORY — DX: Gastro-esophageal reflux disease without esophagitis: K21.9

## 2023-01-25 NOTE — Progress Notes (Unsigned)
Medical Clearance has been received from Alvester Morin, NP. The patient is cleared at Low risk for surgery.

## 2023-01-25 NOTE — Patient Instructions (Addendum)
Your procedure is scheduled on: Tuesday, March 19 Report to the Registration Desk on the 1st floor of the [facility]. To find out your arrival time, please call [phone] between 1PM - 3PM on: Monday, March 18 If your arrival time is 6:00 am, do not arrive before that time as the [place] entrance doors do not open until 6:00 am.  REMEMBER: Instructions that are not followed completely may result in serious medical risk, up to and including death; or upon the discretion of your surgeon and anesthesiologist your surgery may need to be rescheduled.  Do not eat food after midnight the night before surgery.  No gum chewing or hard candies.  You may however, drink CLEAR liquids up to 2 hours before you are scheduled to arrive for your surgery. Do not drink anything within 2 hours of your scheduled arrival time.  Clear liquids include: - water  - apple juice without pulp - gatorade (not RED colors) - black coffee or tea (Do NOT add milk or creamers to the coffee or tea) Do NOT drink anything that is not on this list.  One week prior to surgery: starting today, March 12 Stop Anti-inflammatories (NSAIDS) such as Advil, Aleve, Ibuprofen, Motrin, Naproxen, Naprosyn and Aspirin based products such as Excedrin, Goody's Powder, BC Powder. Stop ANY OVER THE COUNTER supplements until after surgery. You may however, continue to take Tylenol if needed for pain up until the day of surgery.  Continue taking all prescribed medications with the exception of the following:  Per Dr. [facility] Abbot; the last day to take aspirin is Wednesday, March 13. Resume AFTER surgery per surgeon instructions.  TAKE ONLY THESE MEDICATIONS THE MORNING OF SURGERY WITH A SIP OF WATER:  Albuterol inhaler Buspirone Duloxetine Gabapentin  Use inhalers on the day of surgery and bring to the hospital.  No Alcohol for 24 hours before or after surgery.  No Smoking including e-cigarettes for 24 hours before surgery.   No chewable tobacco products for at least 6 hours before surgery.  No nicotine patches on the day of surgery.  Do not use any "recreational" drugs for at least a week (preferably 2 weeks) before your surgery.  Please be advised that the combination of cocaine and anesthesia may have negative outcomes, up to and including death. If you test positive for cocaine, your surgery will be cancelled.  On the morning of surgery brush your teeth with toothpaste and water, you may rinse your mouth with mouthwash if you wish. Do not swallow any toothpaste or mouthwash.  Use CHG Soap as directed on instruction sheet.  Do not wear jewelry, make-up, hairpins, clips or nail polish.  Do not wear lotions, powders, or perfumes.   Do not shave body hair from the neck down 48 hours before surgery.  Contact lenses, hearing aids and dentures may not be worn into surgery.  Do not bring valuables to the hospital. [facility] is not responsible for any missing/lost belongings or valuables.   Notify your doctor if there is any change in your medical condition (cold, fever, infection).  Wear comfortable clothing (specific to your surgery type) to the hospital.  After surgery, you can help prevent lung complications by doing breathing exercises.  Take deep breaths and cough every 1-2 hours. Your doctor may order a device called an Incentive Spirometer to help you take deep breaths. When coughing or sneezing, hold a pillow firmly against your incision with both hands. This is called "splinting." Doing this helps protect  your incision. It also decreases belly discomfort.  If you are being discharged the day of surgery, you will not be allowed to drive home. You will need a responsible individual to drive you home and stay with you for 24 hours after surgery.   If you are taking public transportation, you will need to have a responsible individual with you.  Please call the [place] Dept. at [phone] if you have any questions about these instructions.  Surgery Visitation Policy:  Patients undergoing a surgery or procedure may have two family members or support persons with them as long as the person is not COVID-19 positive or experiencing its symptoms.      Preparing for Surgery with CHLORHEXIDINE GLUCONATE (CHG) Soap  Chlorhexidine Gluconate (CHG) Soap  o An antiseptic cleaner that kills germs and bonds with the skin to continue killing germs even after washing  o Used for showering the night before surgery and morning of surgery  Before surgery, you can play an important role by reducing the number of germs on your skin.  CHG (Chlorhexidine gluconate) soap is an antiseptic cleanser which kills germs and bonds with the skin to continue killing germs even after washing.  Please do not use if you have an allergy to CHG or antibacterial soaps. If your skin becomes reddened/irritated stop using the CHG.  1. Shower the NIGHT BEFORE SURGERY and the MORNING OF SURGERY with CHG soap.  2. If you choose to wash your hair, wash your hair first as usual with your normal shampoo.  3. After shampooing, rinse your hair and body thoroughly to remove the shampoo.  4. Use CHG as you would any other liquid soap. You can apply CHG directly to the skin and wash gently with a scrungie or a clean washcloth.  5. Apply the CHG soap to your body only from the neck down. Do not use on open wounds or open sores. Avoid contact with your eyes, ears, mouth, and genitals (private parts). Wash face and genitals (private parts) with your normal soap.  6. Wash thoroughly, paying special attention to the area where your surgery will be performed.  7. Thoroughly rinse your body with warm water.  8. Do not shower/wash with your normal soap after using and rinsing off the CHG soap.  9. Pat yourself dry with a clean towel.  10. Wear clean pajamas to bed the night before surgery.  12. Place clean  sheets on your bed the night of your first shower and do not sleep with pets.  13. Shower again with the CHG soap on the day of surgery prior to arriving at the hospital.  14. Do not apply any deodorants/lotions/powders.  15. Please wear clean clothes to the hospital.
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
## 2023-01-26 NOTE — Progress Notes (Signed)
  Perioperative Services Pre-Admission/Anesthesia Testing     Date: 01/26/23  Name: Gloria Aguilar MRN:   [id number]  Re: Change in [place] for upcoming surgery   Case: [id number] Date/Time: 02/01/23 1220   Procedures:      XI ROBOTIC ASSISTED LAPAROSCOPIC CHOLECYSTECTOMY     INDOCYANINE GREEN FLUORESCENCE IMAGING (ICG)   Anesthesia type: General   Pre-op diagnosis: symptomatic cholelithiasis   Location: [facility] OR ROOM 04 / [place] [facility]S FOR ANESTHESIA GROUP   Surgeons: Olean Ree, MD     Primary attending surgeon was consulted regarding consideration of therapeutic change in antimicrobial agent being used for preoperative prophylaxis in this patient's upcoming surgical case. Following analysis of the risk versus benefits, the patient's primary attending surgeon advised that it would be acceptable to discontinue the ordered ciprofloxacin and place an order for cefazolin 2 gm IV on call to the OR. Orders for this patient were amended by me following collaborative conversation with attending surgeon taking into consideration of risk versus benefits associated with the change in therapy.  Honor Loh, MSN, APRN, FNP-C, CEN [facility]  Peri-operative Services Nurse Practitioner Phone: [phone] 01/26/23 8:33 AM

## 2023-01-27 ENCOUNTER — Other Ambulatory Visit: Payer: Medicaid Other

## 2023-02-01 ENCOUNTER — Inpatient Hospital Stay
Admission: RE | Admit: 2023-02-01 | Discharge: 2023-02-03 | DRG: 988 | Disposition: A | Discharge status: Skilled Nursing Facility | Payer: Medicaid Other | Source: Skilled Nursing Facility | Attending: Internal Medicine | Admitting: Internal Medicine

## 2023-02-01 ENCOUNTER — Encounter: Payer: Self-pay | Admitting: Surgery

## 2023-02-01 ENCOUNTER — Other Ambulatory Visit: Payer: Self-pay

## 2023-02-01 ENCOUNTER — Ambulatory Visit: Payer: Medicaid Other | Admitting: Urgent Care

## 2023-02-01 ENCOUNTER — Ambulatory Visit: Payer: Medicaid Other

## 2023-02-01 ENCOUNTER — Encounter
Admission: RE | Disposition: A | Discharge status: Skilled Nursing Facility | Payer: Self-pay | Source: Skilled Nursing Facility | Attending: Internal Medicine

## 2023-02-01 DIAGNOSIS — R000 Tachycardia, unspecified: Secondary | ICD-10-CM | POA: Diagnosis present

## 2023-02-01 DIAGNOSIS — Z720 Tobacco use: Secondary | ICD-10-CM | POA: Diagnosis present

## 2023-02-01 DIAGNOSIS — I10 Essential (primary) hypertension: Secondary | ICD-10-CM | POA: Insufficient documentation

## 2023-02-01 DIAGNOSIS — J9811 Atelectasis: Secondary | ICD-10-CM | POA: Diagnosis present

## 2023-02-01 DIAGNOSIS — T41205A Adverse effect of unspecified general anesthetics, initial encounter: Secondary | ICD-10-CM | POA: Diagnosis present

## 2023-02-01 DIAGNOSIS — Z885 Allergy status to narcotic agent status: Secondary | ICD-10-CM | POA: Diagnosis not present

## 2023-02-01 DIAGNOSIS — S069XAA Unspecified intracranial injury with loss of consciousness status unknown, initial encounter: Secondary | ICD-10-CM | POA: Diagnosis present

## 2023-02-01 DIAGNOSIS — K8020 Calculus of gallbladder without cholecystitis without obstruction: Secondary | ICD-10-CM | POA: Diagnosis present

## 2023-02-01 DIAGNOSIS — Z87820 Personal history of traumatic brain injury: Secondary | ICD-10-CM | POA: Diagnosis not present

## 2023-02-01 DIAGNOSIS — J9601 Acute respiratory failure with hypoxia: Principal | ICD-10-CM | POA: Diagnosis present

## 2023-02-01 DIAGNOSIS — Z7982 Long term (current) use of aspirin: Secondary | ICD-10-CM

## 2023-02-01 DIAGNOSIS — Z888 Allergy status to other drugs, medicaments and biological substances status: Secondary | ICD-10-CM

## 2023-02-01 DIAGNOSIS — F32A Depression, unspecified: Secondary | ICD-10-CM | POA: Diagnosis present

## 2023-02-01 DIAGNOSIS — Z7401 Bed confinement status: Secondary | ICD-10-CM

## 2023-02-01 DIAGNOSIS — F419 Anxiety disorder, unspecified: Secondary | ICD-10-CM | POA: Diagnosis present

## 2023-02-01 DIAGNOSIS — M109 Gout, unspecified: Secondary | ICD-10-CM | POA: Diagnosis present

## 2023-02-01 DIAGNOSIS — E669 Obesity, unspecified: Secondary | ICD-10-CM | POA: Diagnosis present

## 2023-02-01 DIAGNOSIS — M1990 Unspecified osteoarthritis, unspecified site: Secondary | ICD-10-CM | POA: Diagnosis present

## 2023-02-01 DIAGNOSIS — J45909 Unspecified asthma, uncomplicated: Secondary | ICD-10-CM | POA: Diagnosis present

## 2023-02-01 DIAGNOSIS — R0789 Other chest pain: Secondary | ICD-10-CM | POA: Diagnosis not present

## 2023-02-01 DIAGNOSIS — Z881 Allergy status to other antibiotic agents status: Secondary | ICD-10-CM

## 2023-02-01 DIAGNOSIS — K219 Gastro-esophageal reflux disease without esophagitis: Secondary | ICD-10-CM | POA: Diagnosis present

## 2023-02-01 DIAGNOSIS — I69351 Hemiplegia and hemiparesis following cerebral infarction affecting right dominant side: Secondary | ICD-10-CM | POA: Diagnosis not present

## 2023-02-01 DIAGNOSIS — Z79899 Other long term (current) drug therapy: Secondary | ICD-10-CM

## 2023-02-01 DIAGNOSIS — Z6841 Body Mass Index (BMI) 40.0 and over, adult: Secondary | ICD-10-CM

## 2023-02-01 DIAGNOSIS — Z1152 Encounter for screening for COVID-19: Secondary | ICD-10-CM | POA: Diagnosis not present

## 2023-02-01 DIAGNOSIS — Z8673 Personal history of transient ischemic attack (TIA), and cerebral infarction without residual deficits: Secondary | ICD-10-CM

## 2023-02-01 DIAGNOSIS — F418 Other specified anxiety disorders: Secondary | ICD-10-CM | POA: Diagnosis present

## 2023-02-01 DIAGNOSIS — F17290 Nicotine dependence, other tobacco product, uncomplicated: Secondary | ICD-10-CM | POA: Diagnosis present

## 2023-02-01 LAB — COMPREHENSIVE METABOLIC PANEL
ALT: 52 U/L — ABNORMAL HIGH (ref 0–44)
AST: 66 U/L — ABNORMAL HIGH (ref 15–41)
Albumin: 3.8 g/dL (ref 3.5–5.0)
Alkaline Phosphatase: 174 U/L — ABNORMAL HIGH (ref 38–126)
Anion gap: 12 (ref 5–15)
BUN: 20 mg/dL (ref 6–20)
CO2: 23 mmol/L (ref 22–32)
Calcium: 9 mg/dL (ref 8.9–10.3)
Chloride: 102 mmol/L (ref 98–111)
Creatinine, Ser: 0.71 mg/dL (ref 0.44–1.00)
GFR, Estimated: >60 mL/min (ref >60)
Glucose, Bld: 148 mg/dL — ABNORMAL HIGH (ref 70–99)
Potassium: 4.2 mmol/L (ref 3.5–5.1)
Sodium: 137 mmol/L (ref 135–145)
Total Bilirubin: 0.4 mg/dL (ref 0.3–1.2)
Total Protein: 7.8 g/dL (ref 6.5–8.1)

## 2023-02-01 LAB — MAGNESIUM: Magnesium: 1.7 mg/dL (ref 1.7–2.4)

## 2023-02-01 LAB — CBC WITH DIFFERENTIAL/PLATELET
Abs Immature Granulocytes: 0.03 10*3/uL (ref 0.00–0.07)
Basophils Absolute: 0 10*3/uL (ref 0.0–0.1)
Basophils Relative: 0 %
Eosinophils Absolute: 0 10*3/uL (ref 0.0–0.5)
Eosinophils Relative: 0 %
HCT: 38.8 % (ref 36.0–46.0)
Hemoglobin: 13.4 g/dL (ref 12.0–15.0)
Immature Granulocytes: 0 %
Lymphocytes Relative: 7 %
Lymphs Abs: 0.7 10*3/uL (ref 0.7–4.0)
MCH: 30.3 pg (ref 26.0–34.0)
MCHC: 34.5 g/dL (ref 30.0–36.0)
MCV: 87.8 fL (ref 80.0–100.0)
Monocytes Absolute: 0.1 10*3/uL (ref 0.1–1.0)
Monocytes Relative: 1 %
Neutro Abs: 8.8 10*3/uL — ABNORMAL HIGH (ref 1.7–7.7)
Neutrophils Relative %: 92 %
Platelets: 255 10*3/uL (ref 150–400)
RBC: 4.42 MIL/uL (ref 3.87–5.11)
RDW: 14 % (ref 11.5–15.5)
WBC: 9.6 10*3/uL (ref 4.0–10.5)
nRBC: 0 % (ref 0.0–0.2)

## 2023-02-01 LAB — BLOOD GAS, VENOUS
Acid-base deficit: 1.8 mmol/L (ref 0.0–2.0)
Bicarbonate: 25.8 mmol/L (ref 20.0–28.0)
O2 Saturation: 87.8 %
Patient temperature: 37
pCO2, Ven: 55 mmHg (ref 44–60)
pH, Ven: 7.28 (ref 7.25–7.43)
pO2, Ven: 59 mmHg — ABNORMAL HIGH (ref 32–45)

## 2023-02-01 LAB — TROPONIN I (HIGH SENSITIVITY)
Troponin I (High Sensitivity): 3 ng/L (ref <18)
Troponin I (High Sensitivity): 2 ng/L (ref <18)

## 2023-02-01 LAB — PHOSPHORUS: Phosphorus: 4.8 mg/dL — ABNORMAL HIGH (ref 2.5–4.6)

## 2023-02-01 LAB — PROCALCITONIN: Procalcitonin: <0.1 ng/mL

## 2023-02-01 SURGERY — CHOLECYSTECTOMY, ROBOT-ASSISTED, LAPAROSCOPIC
Anesthesia: General

## 2023-02-01 MED ORDER — ALLOPURINOL 100 MG PO TABS
100.0000 mg | ORAL_TABLET | Freq: Every day | ORAL | Status: DC
  Administered 2023-02-02 – 2023-02-03 (×2): 100 mg via ORAL
  Filled 2023-02-01 (×2): qty 1

## 2023-02-01 MED ORDER — NICOTINE 14 MG/24HR TD PT24: 14.0000 mg | MEDICATED_PATCH | Freq: Every day | TRANSDERMAL | Status: DC | PRN

## 2023-02-01 MED ORDER — IPRATROPIUM-ALBUTEROL 0.5-2.5 (3) MG/3ML IN SOLN
RESPIRATORY_TRACT | Status: AC
  Administered 2023-02-01: 3 mL via RESPIRATORY_TRACT
  Filled 2023-02-01: qty 3

## 2023-02-01 MED ORDER — ACETAMINOPHEN 650 MG RE SUPP: 650.0000 mg | Freq: Four times a day (QID) | RECTAL | Status: DC | PRN

## 2023-02-01 MED ORDER — KETOROLAC TROMETHAMINE 30 MG/ML IJ SOLN
INTRAMUSCULAR | Status: DC | PRN
  Administered 2023-02-01: 30 mg via INTRAVENOUS

## 2023-02-01 MED ORDER — CHLORHEXIDINE GLUCONATE 0.12 % MT SOLN
OROMUCOSAL | Status: AC
  Administered 2023-02-01: 15 mL via OROMUCOSAL
  Filled 2023-02-01: qty 15

## 2023-02-01 MED ORDER — BUPIVACAINE LIPOSOME 1.3 % IJ SUSP
INTRAMUSCULAR | Status: DC | PRN
  Administered 2023-02-01: 10 mL

## 2023-02-01 MED ORDER — ACETAMINOPHEN 325 MG PO TABS
650.0000 mg | ORAL_TABLET | Freq: Four times a day (QID) | ORAL | Status: DC | PRN
  Administered 2023-02-02: 650 mg via ORAL
  Filled 2023-02-01: qty 2

## 2023-02-01 MED ORDER — SENNOSIDES-DOCUSATE SODIUM 8.6-50 MG PO TABS: 1.0000 | ORAL_TABLET | Freq: Every evening | ORAL | Status: DC | PRN

## 2023-02-01 MED ORDER — IPRATROPIUM-ALBUTEROL 0.5-2.5 (3) MG/3ML IN SOLN
RESPIRATORY_TRACT | Status: AC
  Filled 2023-02-01: qty 3

## 2023-02-01 MED ORDER — FENTANYL CITRATE (PF) 100 MCG/2ML IJ SOLN
25.0000 ug | INTRAMUSCULAR | Status: DC | PRN
  Administered 2023-02-01: 25 ug via INTRAVENOUS

## 2023-02-01 MED ORDER — ONDANSETRON HCL 4 MG/2ML IJ SOLN
INTRAMUSCULAR | Status: DC | PRN
  Administered 2023-02-01: 4 mg via INTRAVENOUS

## 2023-02-01 MED ORDER — ROCURONIUM BROMIDE 100 MG/10ML IV SOLN
INTRAVENOUS | Status: DC | PRN
  Administered 2023-02-01: 10 mg via INTRAVENOUS
  Administered 2023-02-01: 40 mg via INTRAVENOUS

## 2023-02-01 MED ORDER — CLONAZEPAM 1 MG PO TABS
1.5000 mg | ORAL_TABLET | Freq: Two times a day (BID) | ORAL | Status: DC | PRN
  Administered 2023-02-02 (×2): 1.5 mg via ORAL
  Filled 2023-02-01 (×2): qty 1

## 2023-02-01 MED ORDER — IPRATROPIUM-ALBUTEROL 0.5-2.5 (3) MG/3ML IN SOLN: 3.0000 mL | RESPIRATORY_TRACT | Status: DC

## 2023-02-01 MED ORDER — OXYCODONE-ACETAMINOPHEN 5-325 MG PO TABS
1.0000 | ORAL_TABLET | Freq: Four times a day (QID) | ORAL | Status: AC | PRN
  Administered 2023-02-01 – 2023-02-02 (×3): 1 via ORAL
  Filled 2023-02-01 (×3): qty 1

## 2023-02-01 MED ORDER — IPRATROPIUM-ALBUTEROL 0.5-2.5 (3) MG/3ML IN SOLN: 3.0000 mL | Freq: Four times a day (QID) | RESPIRATORY_TRACT | Status: DC | PRN

## 2023-02-01 MED ORDER — GABAPENTIN 300 MG PO CAPS
ORAL_CAPSULE | ORAL | Status: AC
  Administered 2023-02-01: 300 mg via ORAL
  Filled 2023-02-01: qty 1

## 2023-02-01 MED ORDER — HYDROMORPHONE HCL 1 MG/ML IJ SOLN
INTRAMUSCULAR | Status: AC
  Administered 2023-02-01: 0.5 mg via INTRAVENOUS
  Filled 2023-02-01: qty 1

## 2023-02-01 MED ORDER — SEVOFLURANE IN SOLN
RESPIRATORY_TRACT | Status: AC
  Filled 2023-02-01: qty 250

## 2023-02-01 MED ORDER — ASPIRIN 81 MG PO TBEC
81.0000 mg | DELAYED_RELEASE_TABLET | Freq: Every day | ORAL | Status: AC
  Administered 2023-02-01 – 2023-02-02 (×2): 81 mg via ORAL
  Filled 2023-02-01 (×2): qty 1

## 2023-02-01 MED ORDER — OXYCODONE HCL 5 MG PO TABS: 5.0000 mg | ORAL_TABLET | ORAL | 0 refills | Status: DC | PRN

## 2023-02-01 MED ORDER — URSODIOL 300 MG PO CAPS
300.0000 mg | ORAL_CAPSULE | Freq: Three times a day (TID) | ORAL | Status: DC
  Administered 2023-02-01 – 2023-02-03 (×5): 300 mg via ORAL
  Filled 2023-02-01 (×6): qty 1

## 2023-02-01 MED ORDER — FENTANYL CITRATE (PF) 100 MCG/2ML IJ SOLN
INTRAMUSCULAR | Status: AC
  Filled 2023-02-01: qty 2

## 2023-02-01 MED ORDER — DEXAMETHASONE SODIUM PHOSPHATE 10 MG/ML IJ SOLN
INTRAMUSCULAR | Status: AC
  Filled 2023-02-01: qty 1

## 2023-02-01 MED ORDER — TRAZODONE HCL 50 MG PO TABS
150.0000 mg | ORAL_TABLET | Freq: Every evening | ORAL | Status: AC | PRN
  Administered 2023-02-02: 150 mg via ORAL
  Filled 2023-02-01: qty 1

## 2023-02-01 MED ORDER — PROPOFOL 10 MG/ML IV BOLUS
INTRAVENOUS | Status: AC
  Filled 2023-02-01: qty 20

## 2023-02-01 MED ORDER — GABAPENTIN 300 MG PO CAPS: 300.0000 mg | ORAL_CAPSULE | ORAL | Status: AC

## 2023-02-01 MED ORDER — INDOCYANINE GREEN 25 MG IV SOLR
2.5000 mg | INTRAVENOUS | Status: AC
  Administered 2023-02-01: 2.5 mg via INTRAVENOUS
  Filled 2023-02-01 (×2): qty 10

## 2023-02-01 MED ORDER — HYDRALAZINE HCL 20 MG/ML IJ SOLN: 5.0000 mg | Freq: Three times a day (TID) | INTRAMUSCULAR | Status: DC | PRN

## 2023-02-01 MED ORDER — ONDANSETRON HCL 4 MG/2ML IJ SOLN
INTRAMUSCULAR | Status: AC
  Filled 2023-02-01: qty 2

## 2023-02-01 MED ORDER — IBUPROFEN 600 MG PO TABS: 600.0000 mg | ORAL_TABLET | Freq: Three times a day (TID) | ORAL | 1 refills | Status: AC | PRN

## 2023-02-01 MED ORDER — LACTATED RINGERS IV SOLN: INTRAVENOUS | Status: DC

## 2023-02-01 MED ORDER — LACTULOSE 10 GM/15ML PO SOLN
30.0000 g | Freq: Every day | ORAL | Status: DC
  Administered 2023-02-02 – 2023-02-03 (×2): 30 g via ORAL
  Filled 2023-02-01 (×2): qty 60

## 2023-02-01 MED ORDER — SUGAMMADEX SODIUM 200 MG/2ML IV SOLN
INTRAVENOUS | Status: DC | PRN
  Administered 2023-02-01: 200 mg via INTRAVENOUS

## 2023-02-01 MED ORDER — MIRTAZAPINE 15 MG PO TABS
30.0000 mg | ORAL_TABLET | Freq: Every day | ORAL | Status: DC
  Administered 2023-02-02: 30 mg via ORAL
  Filled 2023-02-01: qty 2

## 2023-02-01 MED ORDER — DULOXETINE HCL 30 MG PO CPEP
60.0000 mg | ORAL_CAPSULE | Freq: Two times a day (BID) | ORAL | Status: DC
  Administered 2023-02-01 – 2023-02-03 (×4): 60 mg via ORAL
  Filled 2023-02-01 (×4): qty 2

## 2023-02-01 MED ORDER — HYDROMORPHONE HCL 1 MG/ML IJ SOLN
0.2500 mg | INTRAMUSCULAR | Status: DC | PRN
  Administered 2023-02-01: 0.5 mg via INTRAVENOUS

## 2023-02-01 MED ORDER — SUCCINYLCHOLINE CHLORIDE 200 MG/10ML IV SOSY
PREFILLED_SYRINGE | INTRAVENOUS | Status: AC
  Filled 2023-02-01: qty 10

## 2023-02-01 MED ORDER — BUPIVACAINE LIPOSOME 1.3 % IJ SUSP
INTRAMUSCULAR | Status: AC
  Filled 2023-02-01: qty 10

## 2023-02-01 MED ORDER — ALBUTEROL SULFATE HFA 108 (90 BASE) MCG/ACT IN AERS
INHALATION_SPRAY | RESPIRATORY_TRACT | Status: AC
  Filled 2023-02-01: qty 6.7

## 2023-02-01 MED ORDER — LIDOCAINE HCL (CARDIAC) PF 100 MG/5ML IV SOSY
PREFILLED_SYRINGE | INTRAVENOUS | Status: DC | PRN
  Administered 2023-02-01 (×2): 30 mg via INTRAVENOUS

## 2023-02-01 MED ORDER — CEFAZOLIN SODIUM-DEXTROSE 2-4 GM/100ML-% IV SOLN
INTRAVENOUS | Status: AC
  Filled 2023-02-01: qty 100

## 2023-02-01 MED ORDER — FENTANYL CITRATE (PF) 100 MCG/2ML IJ SOLN
INTRAMUSCULAR | Status: AC
  Administered 2023-02-01: 25 ug via INTRAVENOUS
  Filled 2023-02-01: qty 2

## 2023-02-01 MED ORDER — KETOROLAC TROMETHAMINE 30 MG/ML IJ SOLN
INTRAMUSCULAR | Status: AC
  Filled 2023-02-01: qty 1

## 2023-02-01 MED ORDER — LABETALOL HCL 5 MG/ML IV SOLN
INTRAVENOUS | Status: AC
  Filled 2023-02-01: qty 4

## 2023-02-01 MED ORDER — PROMETHAZINE HCL 25 MG/ML IJ SOLN: 6.2500 mg | INTRAMUSCULAR | Status: DC | PRN

## 2023-02-01 MED ORDER — LABETALOL HCL 5 MG/ML IV SOLN
INTRAVENOUS | Status: DC | PRN
  Administered 2023-02-01 (×2): 5 mg via INTRAVENOUS

## 2023-02-01 MED ORDER — FENTANYL CITRATE (PF) 100 MCG/2ML IJ SOLN
INTRAMUSCULAR | Status: DC | PRN
  Administered 2023-02-01: 50 ug via INTRAVENOUS
  Administered 2023-02-01: 100 ug via INTRAVENOUS
  Administered 2023-02-01 (×3): 50 ug via INTRAVENOUS

## 2023-02-01 MED ORDER — BUSPIRONE HCL 10 MG PO TABS
5.0000 mg | ORAL_TABLET | Freq: Three times a day (TID) | ORAL | Status: DC
  Administered 2023-02-01 – 2023-02-03 (×5): 5 mg via ORAL
  Filled 2023-02-01 (×5): qty 1

## 2023-02-01 MED ORDER — ALBUTEROL SULFATE HFA 108 (90 BASE) MCG/ACT IN AERS
INHALATION_SPRAY | RESPIRATORY_TRACT | Status: DC | PRN
  Administered 2023-02-01: 4 via RESPIRATORY_TRACT

## 2023-02-01 MED ORDER — FENTANYL CITRATE (PF) 100 MCG/2ML IJ SOLN: 25.0000 ug | INTRAMUSCULAR | Status: DC | PRN

## 2023-02-01 MED ORDER — DEXAMETHASONE SODIUM PHOSPHATE 10 MG/ML IJ SOLN
INTRAMUSCULAR | Status: DC | PRN
  Administered 2023-02-01: 10 mg via INTRAVENOUS

## 2023-02-01 MED ORDER — GABAPENTIN 300 MG PO CAPS
600.0000 mg | ORAL_CAPSULE | Freq: Three times a day (TID) | ORAL | Status: DC
  Administered 2023-02-02 – 2023-02-03 (×4): 600 mg via ORAL
  Filled 2023-02-01 (×4): qty 2

## 2023-02-01 MED ORDER — ORAL CARE MOUTH RINSE: 15.0000 mL | Freq: Once | OROMUCOSAL | Status: AC

## 2023-02-01 MED ORDER — ONDANSETRON HCL 4 MG PO TABS: 4.0000 mg | ORAL_TABLET | Freq: Four times a day (QID) | ORAL | Status: DC | PRN

## 2023-02-01 MED ORDER — ACETAMINOPHEN 500 MG PO TABS
ORAL_TABLET | ORAL | Status: AC
  Administered 2023-02-01: 1000 mg via ORAL
  Filled 2023-02-01: qty 2

## 2023-02-01 MED ORDER — BUPIVACAINE HCL (PF) 0.25 % IJ SOLN
INTRAMUSCULAR | Status: AC
  Filled 2023-02-01: qty 30

## 2023-02-01 MED ORDER — CHLORHEXIDINE GLUCONATE CLOTH 2 % EX PADS: 6.0000 | MEDICATED_PAD | Freq: Once | CUTANEOUS | Status: DC

## 2023-02-01 MED ORDER — ROCURONIUM BROMIDE 10 MG/ML (PF) SYRINGE
PREFILLED_SYRINGE | INTRAVENOUS | Status: AC
  Filled 2023-02-01: qty 10

## 2023-02-01 MED ORDER — SUCCINYLCHOLINE CHLORIDE 200 MG/10ML IV SOSY
PREFILLED_SYRINGE | INTRAVENOUS | Status: DC | PRN
  Administered 2023-02-01: 120 mg via INTRAVENOUS

## 2023-02-01 MED ORDER — METOPROLOL TARTRATE 5 MG/5ML IV SOLN
5.0000 mg | INTRAVENOUS | Status: DC | PRN
  Administered 2023-02-02: 5 mg via INTRAVENOUS
  Filled 2023-02-01: qty 5

## 2023-02-01 MED ORDER — OXYCODONE HCL 5 MG PO TABS: 5.0000 mg | ORAL_TABLET | Freq: Once | ORAL | Status: AC

## 2023-02-01 MED ORDER — ONDANSETRON HCL 4 MG/2ML IJ SOLN: 4.0000 mg | Freq: Four times a day (QID) | INTRAMUSCULAR | Status: DC | PRN

## 2023-02-01 MED ORDER — FAMOTIDINE 20 MG PO TABS
ORAL_TABLET | ORAL | Status: AC
  Administered 2023-02-01: 20 mg via ORAL
  Filled 2023-02-01: qty 1

## 2023-02-01 MED ORDER — CHLORHEXIDINE GLUCONATE 0.12 % MT SOLN: 15.0000 mL | Freq: Once | OROMUCOSAL | Status: AC

## 2023-02-01 MED ORDER — CHLORHEXIDINE GLUCONATE CLOTH 2 % EX PADS
6.0000 | MEDICATED_PAD | Freq: Once | CUTANEOUS | Status: AC
  Administered 2023-02-01: 6 via TOPICAL

## 2023-02-01 MED ORDER — ACETAMINOPHEN 500 MG PO TABS: 1000.0000 mg | ORAL_TABLET | ORAL | Status: AC

## 2023-02-01 MED ORDER — CEFAZOLIN SODIUM-DEXTROSE 2-4 GM/100ML-% IV SOLN
2.0000 g | Freq: Once | INTRAVENOUS | Status: AC
  Administered 2023-02-01: 2 g via INTRAVENOUS

## 2023-02-01 MED ORDER — DM-GUAIFENESIN ER 30-600 MG PO TB12: 1.0000 | ORAL_TABLET | Freq: Two times a day (BID) | ORAL | Status: DC | PRN

## 2023-02-01 MED ORDER — LIDOCAINE HCL (PF) 2 % IJ SOLN
INTRAMUSCULAR | Status: AC
  Filled 2023-02-01: qty 5

## 2023-02-01 MED ORDER — FAMOTIDINE 20 MG PO TABS: 20.0000 mg | ORAL_TABLET | Freq: Once | ORAL | Status: AC

## 2023-02-01 MED ORDER — OXYCODONE HCL 5 MG PO TABS
ORAL_TABLET | ORAL | Status: AC
  Administered 2023-02-01: 5 mg via ORAL
  Filled 2023-02-01: qty 1

## 2023-02-01 MED ORDER — BUPIVACAINE LIPOSOME 1.3 % IJ SUSP: 20.0000 mL | Freq: Once | INTRAMUSCULAR | Status: DC

## 2023-02-01 MED ORDER — BUPIVACAINE HCL 0.25 % IJ SOLN
INTRAMUSCULAR | Status: DC | PRN
  Administered 2023-02-01: 30 mL

## 2023-02-01 MED ORDER — PROPOFOL 10 MG/ML IV BOLUS
INTRAVENOUS | Status: DC | PRN
  Administered 2023-02-01: 50 mg via INTRAVENOUS
  Administered 2023-02-01: 150 mg via INTRAVENOUS

## 2023-02-01 SURGICAL SUPPLY — 55 items
ADH SKN CLS APL DERMABOND .7 (GAUZE/BANDAGES/DRESSINGS) ×1
BAG PRESSURE INF REUSE 1000 (BAG) IMPLANT
CANNULA CAP OBTURATR AIRSEAL 8 (CAP) IMPLANT
CANNULA REDUC XI 12-8 STAPL (CANNULA) ×1
CANNULA REDUCER 12-8 DVNC XI (CANNULA) ×1 IMPLANT
CLIP LIGATING HEMO O LOK GREEN (MISCELLANEOUS) ×1 IMPLANT
DERMABOND ADVANCED .7 DNX12 (GAUZE/BANDAGES/DRESSINGS) ×1 IMPLANT
DRAPE ARM DVNC X/XI (DISPOSABLE) ×4 IMPLANT
DRAPE COLUMN DVNC XI (DISPOSABLE) ×1 IMPLANT
DRAPE DA VINCI XI ARM (DISPOSABLE) ×4
DRAPE DA VINCI XI COLUMN (DISPOSABLE) ×1
ELECT CAUTERY BLADE TIP 2.5 (TIP) ×1
ELECT REM PT RETURN 9FT ADLT (ELECTROSURGICAL) ×1
ELECTRODE CAUTERY BLDE TIP 2.5 (TIP) ×1 IMPLANT
ELECTRODE REM PT RTRN 9FT ADLT (ELECTROSURGICAL) ×1 IMPLANT
GLOVE SURG SYN 7.0 (GLOVE) ×4 IMPLANT
GLOVE SURG SYN 7.0 PF PI (GLOVE) ×2 IMPLANT
GLOVE SURG SYN 7.5  E (GLOVE) ×4
GLOVE SURG SYN 7.5 E (GLOVE) ×4 IMPLANT
GLOVE SURG SYN 7.5 PF PI (GLOVE) ×2 IMPLANT
GOWN STRL REUS W/ TWL LRG LVL3 (GOWN DISPOSABLE) ×4 IMPLANT
GOWN STRL REUS W/TWL LRG LVL3 (GOWN DISPOSABLE) ×4
IRRIGATOR SUCT 8 DISP DVNC XI (IRRIGATION / IRRIGATOR) IMPLANT
IRRIGATOR SUCTION 8MM XI DISP (IRRIGATION / IRRIGATOR)
IV NS 1000ML (IV SOLUTION)
IV NS 1000ML BAXH (IV SOLUTION) IMPLANT
KIT PINK PAD W/HEAD ARE REST (MISCELLANEOUS) ×1
KIT PINK PAD W/HEAD ARM REST (MISCELLANEOUS) ×1 IMPLANT
LABEL OR SOLS (LABEL) ×1 IMPLANT
MANIFOLD NEPTUNE II (INSTRUMENTS) ×1 IMPLANT
NEEDLE HYPO 22GX1.5 SAFETY (NEEDLE) ×1 IMPLANT
NS IRRIG 500ML POUR BTL (IV SOLUTION) ×1 IMPLANT
OBTURATOR OPTICAL STANDARD 8MM (TROCAR) ×1
OBTURATOR OPTICAL STND 8 DVNC (TROCAR) ×1
OBTURATOR OPTICALSTD 8 DVNC (TROCAR) ×1 IMPLANT
PACK LAP CHOLECYSTECTOMY (MISCELLANEOUS) ×1 IMPLANT
PENCIL SMOKE EVACUATOR (MISCELLANEOUS) ×1 IMPLANT
SEAL CANN UNIV 5-8 DVNC XI (MISCELLANEOUS) ×3 IMPLANT
SEAL XI 5MM-8MM UNIVERSAL (MISCELLANEOUS) ×3
SET TUBE FILTERED XL AIRSEAL (SET/KITS/TRAYS/PACK) IMPLANT
SET TUBE SMOKE EVAC HIGH FLOW (TUBING) ×1 IMPLANT
SOL ELECTROSURG ANTI STICK (MISCELLANEOUS) ×1
SOLUTION ELECTROSURG ANTI STCK (MISCELLANEOUS) ×1 IMPLANT
SPIKE FLUID TRANSFER (MISCELLANEOUS) ×1 IMPLANT
SPONGE T-LAP 18X18 ~~LOC~~+RFID (SPONGE) IMPLANT
SPONGE T-LAP 4X18 ~~LOC~~+RFID (SPONGE) ×1 IMPLANT
STAPLER CANNULA SEAL DVNC XI (STAPLE) ×1 IMPLANT
STAPLER CANNULA SEAL XI (STAPLE) ×1
SUT MNCRL AB 4-0 PS2 18 (SUTURE) ×1 IMPLANT
SUT VIC AB 3-0 SH 27 (SUTURE)
SUT VIC AB 3-0 SH 27X BRD (SUTURE) IMPLANT
SUT VICRYL 0 UR6 27IN ABS (SUTURE) ×2 IMPLANT
SYS BAG RETRIEVAL 10MM (BASKET) ×1
SYSTEM BAG RETRIEVAL 10MM (BASKET) ×1 IMPLANT
WATER STERILE IRR 500ML POUR (IV SOLUTION) ×1 IMPLANT

## 2023-02-01 NOTE — Assessment & Plan Note (Signed)
This meets criteria for morbid obesity based on the presence of 1 or more chronic comorbidities. Patient has BMI of 39.95, history of CVA. This complicates overall care and prognosis.

## 2023-02-01 NOTE — Assessment & Plan Note (Addendum)
Etiology workup in progress Avoid unnecessary sedating medications including: Gabapentin, scheduled trazodone, tramadol, Robaxin Check procalcitonin, respiratory panel, VBG, high sensitive troponin Ordered admission labs CMP, CBC with differentials, magnesium, phosphorus DuoNebs every 6 hours as needed for wheezing and shortness of breath, 4 days ordered Admit to PCU, inpatient

## 2023-02-01 NOTE — Assessment & Plan Note (Signed)
Per healthcare power of attorney at bedside, patient appears to be at baseline now

## 2023-02-01 NOTE — Transfer of Care (Signed)
Immediate Anesthesia Transfer of Care Note  Patient: Gloria Aguilar  Procedure(s) Performed: XI ROBOTIC ASSISTED LAPAROSCOPIC CHOLECYSTECTOMY INDOCYANINE GREEN FLUORESCENCE IMAGING (ICG)  Patient Location: PACU  Anesthesia Type:General  Level of Consciousness: sedated and responds to stimulation  Airway & Oxygen Therapy: Patient Spontanous Breathing and Patient connected to face mask oxygen  Post-op Assessment: Report given to RN and Post -op Vital signs reviewed and stable  Post vital signs: Reviewed and stable  Last Vitals:  Vitals Value Taken Time  BP 124/86 02/01/23 1445  Temp    Pulse 79 02/01/23 1452  Resp 13 02/01/23 1452  SpO2 91 % 02/01/23 1452  Vitals shown include unvalidated device data.  Last Pain:  Vitals:   02/01/23 1145  TempSrc: Temporal  PainSc: 0-No pain         Complications: No notable events documented.

## 2023-02-01 NOTE — Assessment & Plan Note (Signed)
Presumed secondary to pain Metoprolol 5 mg IV every 4 hours as needed for heart rate greater than 120, 3 doses ordered

## 2023-02-01 NOTE — Transfer of Care (Deleted)
Immediate Anesthesia Transfer of Care Note  Patient: Gloria Aguilar  Procedure(s) Performed: XI ROBOTIC ASSISTED LAPAROSCOPIC CHOLECYSTECTOMY INDOCYANINE GREEN FLUORESCENCE IMAGING (ICG)  Patient Location: PACU  Anesthesia Type:General  Level of Consciousness: sedated and responds to stimulation  Airway & Oxygen Therapy: Patient Spontanous Breathing and Patient connected to face mask oxygen  Post-op Assessment: Report given to RN and Post -op Vital signs reviewed and stable  Post vital signs: Reviewed and stable  Last Vitals:  Vitals Value Taken Time  BP 124/86 02/01/23 1445  Temp    Pulse 74 02/01/23 1447  Resp 11 02/01/23 1447  SpO2 90 % 02/01/23 1447  Vitals shown include unvalidated device data.  Last Pain:  Vitals:   02/01/23 1145  TempSrc: Temporal  PainSc: 0-No pain         Complications: No notable events documented.

## 2023-02-01 NOTE — Discharge Instructions (Addendum)
Discharge Instructions: 1.  Patient may shower, but do not scrub wounds heavily and dab dry only. 2.  Do not submerge wounds in pool/tub until fully healed. 3.  Do not apply ointments or hydrogen peroxide to the wounds. 4.  May apply ice packs to the wounds for comfort. 5.  May resume Aspirin on 02/03/23. 6.  Do not drive while taking narcotics for pain control.  Prior to driving, make sure you are able to rotate right and left to look at blindspots without significant pain or discomfort. 7.  No heavy lifting or pushing of more than 10-15 lbs for 4 weeks.  AMBULATORY SURGERY  DISCHARGE INSTRUCTIONS   The drugs that you were given will stay in your system until tomorrow so for the next 24 hours you should not:  Drive an automobile Make any legal decisions Drink any alcoholic beverage   You may resume regular meals tomorrow.  Today it is better to start with liquids and gradually work up to solid foods.  You may eat anything you prefer, but it is better to start with liquids, then soup and crackers, and gradually work up to solid foods.   Please notify your doctor immediately if you have any unusual bleeding, trouble breathing, redness and pain at the surgery site, drainage, fever, or pain not relieved by medication.    Additional Instructions: PLEASE LEAVE GREEN ARMBAND ON FOR 4 DAYS     Please contact your physician with any problems or Same Day Surgery at [phone], Monday through Friday 6 am to 4 pm, or [place] at [facility] number at [phone].

## 2023-02-01 NOTE — Interval H&P Note (Signed)
History and Physical Interval Note:  02/01/2023 12:29 PM  Gloria Aguilar  has presented today for surgery, with the diagnosis of symptomatic cholelithiasis.  The various methods of treatment have been discussed with the patient and family. After consideration of risks, benefits and other options for treatment, the patient has consented to  Procedure(s): XI ROBOTIC ASSISTED LAPAROSCOPIC CHOLECYSTECTOMY (N/A) [place] (ICG) (N/A) as a surgical intervention.  The patient's history has been reviewed, patient examined, no change in status, stable for surgery.  I have reviewed the patient's chart and labs.  Questions were answered to the patient's satisfaction.     [name]

## 2023-02-01 NOTE — Progress Notes (Signed)
Seen by Dr Rosey Bath. Neb tx and observe. Goal at this time is monitor oxygen/lungs and reduce pain level
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
## 2023-02-01 NOTE — Assessment & Plan Note (Signed)
Patient does not have prior diagnosis of hypertension, presumed secondary to pain as patient is postop for cholecystectomy Hydralazine 5 mg IV every 8 hours as needed for SBP greater than 170, 4 days ordered

## 2023-02-01 NOTE — Progress Notes (Signed)
Pilar Plate [facility]) called to bedside to help Shiffon feel more at ease. She calmed down with him by her side. Breathing sounds better but still presents with upper lung bases wheezing. He states she had some type of respiratory virus prior to surgery that may be preexisting. Gaver her 2 puffs of her albuterol inhaler. Appears to be much calmer and pain appears to be less.

## 2023-02-01 NOTE — Anesthesia Procedure Notes (Signed)
Procedure Name: Intubation Date/Time: 02/01/2023 1:10 PM  Performed by: Levin Erp, CRNAPre-anesthesia Checklist: Patient identified, Emergency Drugs available, Suction available, Patient being monitored and Timeout performed Patient Re-evaluated:Patient Re-evaluated prior to induction Oxygen Delivery Method: Circle system utilized Preoxygenation: Pre-oxygenation with 100% oxygen Induction Type: IV induction Ventilation: Mask ventilation without difficulty Laryngoscope Size: McGraph and 4 Grade View: Grade II Tube type: Oral Number of attempts: 1 Airway Equipment and Method: Stylet Placement Confirmation: ETT inserted through vocal cords under direct vision, positive ETCO2 and breath sounds checked- equal and bilateral Secured at: 20 (at lip) cm Tube secured with: Tape Dental Injury: Teeth and Oropharynx as per pre-operative assessment  Comments: edentulous

## 2023-02-01 NOTE — Assessment & Plan Note (Signed)
-   As needed nicotine patch ordered for nicotine craving 
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
## 2023-02-01 NOTE — Assessment & Plan Note (Signed)
Aspirin 81 mg resumed for day of admission and tomorrow for 02/02/2023 Per med reconciliation patient takes aspirin 81 mg 3 times daily, this was not resumed

## 2023-02-01 NOTE — Progress Notes (Signed)
Unable to wean off O2. Sats down in the 80's with each attempt and after incentive spirometer. Lung bases are clear but still has some congestion in the airway. Was able to cough and spit some clear mucus. She lives in an extended healthcare facility and Pilar Plate, her [facility]) does not feel comfortable with her going back there tonight due to her breathing. Dr Hampton Abbot aware, ordering CXRAY and orders to admit under hospitalist.
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
## 2023-02-01 NOTE — Op Note (Signed)
  Procedure Date:  02/01/2023  Pre-operative Diagnosis:  Symptomatic cholelithiasis  Post-operative Diagnosis: Symptomatic cholelithiasis  Procedure:  Robotic assisted cholecystectomy with ICG FireFly cholangiogram  Surgeon:  Melvyn Neth, MD  Anesthesia:  General endotracheal  Estimated Blood Loss:  15 ml  Specimens:  gallbladder  Complications:  None  Indications for Procedure:  This is a 52 y.o. female who presents with abdominal pain and workup revealing symptomatic cholelithiasis.  The benefits, complications, treatment options, and expected outcomes were discussed with the patient. The risks of bleeding, infection, recurrence of symptoms, failure to resolve symptoms, bile duct damage, bile duct leak, retained common bile duct stone, bowel injury, and need for further procedures were all discussed with the patient and she was willing to proceed.  The patient received an injection of ICG via intravenous catheter before surgery start.  Description of Procedure: The patient was correctly identified in the preoperative area and brought into the operating room.  The patient was placed supine with VTE prophylaxis in place.  Appropriate time-outs were performed.  Anesthesia was induced and the patient was intubated.  Appropriate antibiotics were infused.  The abdomen was prepped and draped in a sterile fashion. An infraumbilical incision was made. A cutdown technique was used to enter the abdominal cavity without injury, and a 12 mm robotic port was inserted.  Pneumoperitoneum was obtained with appropriate opening pressures.  Three 8-mm ports were placed in the mid abdomen at the level of the umbilicus under direct visualization.  The DaVinci platform was docked, camera targeted, and instruments were placed under direct visualization.  The gallbladder was identified.  The fundus was grasped and retracted cephalad.  Adhesions were lysed bluntly and with electrocautery. The infundibulum was  grasped and retracted laterally, exposing the peritoneum overlying the gallbladder.  This was incised with electrocautery and extended on either side of the gallbladder.  FireFly cholangiogram was then obtained, and we were able to clearly identify the cystic duct and common bile duct.  There was uninterrupted flow of ICG from the liver to the common bile duct, the cystic duct, gallbladder, and into the duodenum, confirming patency of the cystic duct and common bile duct.  The cystic duct and cystic artery were carefully dissected with combination of cautery and blunt dissection.  Both were clipped twice proximally and once distally, cutting in between.  The gallbladder was taken from the gallbladder fossa in a retrograde fashion with electrocautery. The gallbladder was placed in an Endocatch bag. The liver bed was inspected and any bleeding was controlled with electrocautery. The right upper quadrant was then inspected again revealing intact clips, no bleeding, and no ductal injury.  The 8 mm ports were removed under direct visualization and the 12 mm port was removed.  The Endocatch bag was brought out via the umbilical incision. The fascial opening was closed using 0 vicryl suture.  Local anesthetic was infused in all incisions and the incisions were closed with 4-0 Monocryl.  The wounds were cleaned and sealed with DermaBond.  The patient was emerged from anesthesia and extubated and brought to the recovery room for further management.  The patient tolerated the procedure well and all counts were correct at the end of the case.   Melvyn Neth, MD

## 2023-02-01 NOTE — Assessment & Plan Note (Signed)
Status postcholecystectomy per general surgery on 02/01/2023

## 2023-02-02 DIAGNOSIS — R0789 Other chest pain: Secondary | ICD-10-CM

## 2023-02-02 DIAGNOSIS — J9601 Acute respiratory failure with hypoxia: Secondary | ICD-10-CM | POA: Diagnosis not present

## 2023-02-02 DIAGNOSIS — K8020 Calculus of gallbladder without cholecystitis without obstruction: Secondary | ICD-10-CM | POA: Diagnosis not present

## 2023-02-02 LAB — BASIC METABOLIC PANEL
Anion gap: 6 (ref 5–15)
BUN: 21 mg/dL — ABNORMAL HIGH (ref 6–20)
CO2: 25 mmol/L (ref 22–32)
Calcium: 8.8 mg/dL — ABNORMAL LOW (ref 8.9–10.3)
Chloride: 106 mmol/L (ref 98–111)
Creatinine, Ser: 0.58 mg/dL (ref 0.44–1.00)
GFR, Estimated: >60 mL/min (ref >60)
Glucose, Bld: 140 mg/dL — ABNORMAL HIGH (ref 70–99)
Potassium: 4.1 mmol/L (ref 3.5–5.1)
Sodium: 137 mmol/L (ref 135–145)

## 2023-02-02 LAB — CBC
HCT: 33.3 % — ABNORMAL LOW (ref 36.0–46.0)
Hemoglobin: 11.8 g/dL — ABNORMAL LOW (ref 12.0–15.0)
MCH: 30.8 pg (ref 26.0–34.0)
MCHC: 35.4 g/dL (ref 30.0–36.0)
MCV: 86.9 fL (ref 80.0–100.0)
Platelets: 254 10*3/uL (ref 150–400)
RBC: 3.83 MIL/uL — ABNORMAL LOW (ref 3.87–5.11)
RDW: 14.1 % (ref 11.5–15.5)
WBC: 8.1 10*3/uL (ref 4.0–10.5)
nRBC: 0 % (ref 0.0–0.2)

## 2023-02-02 LAB — TROPONIN I (HIGH SENSITIVITY): Troponin I (High Sensitivity): 2 ng/L (ref <18)

## 2023-02-02 LAB — RESP PANEL BY RT-PCR (RSV, FLU A&B, COVID)  RVPGX2
Influenza A by PCR: NEGATIVE
Influenza B by PCR: NEGATIVE
Resp Syncytial Virus by PCR: NEGATIVE
SARS Coronavirus 2 by RT PCR: NEGATIVE

## 2023-02-02 MED ORDER — OXYCODONE-ACETAMINOPHEN 5-325 MG PO TABS
1.0000 | ORAL_TABLET | Freq: Four times a day (QID) | ORAL | Status: DC
  Administered 2023-02-02 – 2023-02-03 (×4): 1 via ORAL
  Filled 2023-02-02 (×4): qty 1

## 2023-02-02 MED ORDER — ENOXAPARIN SODIUM 60 MG/0.6ML IJ SOSY
0.5000 mg/kg | PREFILLED_SYRINGE | INTRAMUSCULAR | Status: DC
  Administered 2023-02-02: 50 mg via SUBCUTANEOUS
  Filled 2023-02-02: qty 0.6

## 2023-02-02 MED ORDER — OXYCODONE-ACETAMINOPHEN 5-325 MG PO TABS: 1.0000 | ORAL_TABLET | Freq: Four times a day (QID) | ORAL | Status: DC | PRN

## 2023-02-02 MED ORDER — BLISTEX MEDICATED EX OINT: TOPICAL_OINTMENT | CUTANEOUS | Status: DC | PRN

## 2023-02-02 MED ORDER — DOCUSATE SODIUM 100 MG PO CAPS
200.0000 mg | ORAL_CAPSULE | Freq: Two times a day (BID) | ORAL | Status: DC
  Administered 2023-02-02 – 2023-02-03 (×2): 200 mg via ORAL
  Filled 2023-02-02 (×3): qty 2

## 2023-02-02 MED ORDER — TRAMADOL HCL 50 MG PO TABS
50.0000 mg | ORAL_TABLET | Freq: Four times a day (QID) | ORAL | Status: DC | PRN
  Administered 2023-02-02: 50 mg via ORAL
  Filled 2023-02-02: qty 1
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
## 2023-02-02 NOTE — Anesthesia Postprocedure Evaluation (Signed)
Anesthesia Post Note  Patient: Tierney Wolden  Procedure(s) Performed: XI ROBOTIC ASSISTED LAPAROSCOPIC CHOLECYSTECTOMY INDOCYANINE GREEN FLUORESCENCE IMAGING (ICG)  Patient location during evaluation: PACU Anesthesia Type: General Level of consciousness: awake and alert Pain management: pain level controlled Vital Signs Assessment: post-procedure vital signs reviewed and stable Respiratory status: spontaneous breathing, nonlabored ventilation, respiratory function stable and patient connected to nasal cannula oxygen Cardiovascular status: blood pressure returned to baseline and stable Postop Assessment: no apparent nausea or vomiting Anesthetic complications: no   No notable events documented.   Last Vitals:  Vitals:   02/02/23 0500 02/02/23 0829  BP: 131/78 104/60  Pulse: (!) 105 (!) 109  Resp: 17 19  Temp: 36.6 C 36.5 C  SpO2: 97% 96%    Last Pain:  Vitals:   02/02/23 0609  TempSrc:   PainSc: 5                  Martha Clan
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
## 2023-02-02 NOTE — Progress Notes (Signed)
02/02/2023  Subjective: Patient is 1 Day Post-Op s/p robotic assisted cholecystectomy.  Patient needed admission post-op due to shortness of breath and O2 desaturation in PACU.  Overnight has done well and her O2 was weaned to room air this morning.  Labwork has been reassuring.  CXR yesterday showed bibasilar atelectasis  Vital signs: Temp:  [97 F (36.1 C)-98.2 F (36.8 C)] 98.2 F (36.8 C) (03/20 0959) Pulse Rate:  [69-110] 110 (03/20 0959) Resp:  [11-20] 20 (03/20 0959) BP: (104-146)/(60-109) 124/91 (03/20 0959) SpO2:  [88 %-97 %] 96 % (03/20 0959)   Intake/Output: 03/19 0701 - 03/20 0700 In: 800 [I.V.:700; IV Piggyback:100] Out: 20 [Blood:20]    Physical Exam: Constitutional:  No acute distress Abdomen:  soft, obese, non-distended, appropriately tender to palpation.  Appears that she's tender around the incisions, mostly umbilical area.  No RUQ pain like she had before surgery.  Labs:  Recent Labs    02/01/23 1815 02/02/23 0646  WBC 9.6 8.1  HGB 13.4 11.8*  HCT 38.8 33.3*  PLT 255 254   Recent Labs    02/01/23 1815 02/02/23 0646  NA 137 137  K 4.2 4.1  CL 102 106  CO2 23 25  GLUCOSE 148* 140*  BUN 20 21*  CREATININE 0.71 0.58  CALCIUM 9.0 8.8*   No results for input(s): "LABPROT", "INR" in the last 72 hours.  Imaging: DG Chest Port 1 View  Result Date: 02/01/2023 CLINICAL DATA:  Shortness of breath EXAM: PORTABLE CHEST 1 VIEW COMPARISON:  Chest x-ray dated January 24, 2023 FINDINGS: Cardiac and mediastinal contours are unchanged. Low lung volumes. Bibasilar opacities, likely due to atelectasis. Elevation of the right hemidiaphragm. No evidence of pleural effusion or pneumothorax. IMPRESSION: Low lung volumes with bibasilar atelectasis, findings are unchanged when compared with the prior exam. Electronically Signed   By: Yetta Glassman M.D.   On: 02/01/2023 17:25    Assessment/Plan: This is a 52 y.o. female s/p robotic cholecystectomy, with shortness of  breath and hypoxia.  --Patient is doing better this morning and is weaned back down to room air.  Will order regular diet.   --OK to discharge to her nursing home from surgical standpoint if ok from medical standpoint. --Prescription for pain medications sent to her pharmacy yesterday. --Follow up in two weeks -- 02/15/23.   Melvyn Neth, [facility]

## 2023-02-02 NOTE — Progress Notes (Signed)
PROGRESS NOTE    Gloria Aguilar  [id number] DOB: 1971-06-02 DOA: 02/01/2023 PCP: Danna Hefty, DO   Assessment & Plan:   Principal Problem:   Acute hypoxemic respiratory failure ([place]) Active Problems:   Sinus tachycardia   History of stroke   Traumatic brain injury ([place])   Depression with anxiety   Tobacco abuse   Obesity with body mass index (BMI) of 30.0 to 39.9   Symptomatic cholelithiasis   Essential (primary) hypertension  Assessment and Plan: Acute hypoxic respiratory failure: etiology unclear. Work-up so far is neg. Procal < 0.10. Possibly medication induced vs anesthesia. Continue on bronchodilators. Weaned off of oxygen.   Traumatic brain injury: at baseline. Continue w/ supportive care  Chest pain: EKG done which shows no acute ischemic changes. Troponin neg. Tender to palpation, unlikely cardiac possibly MSK in etiology. Percocet prn    Hx of CVA: continue on aspirin    Tobacco abuse: nicotine patch prn  Obesity: BMI 39.9. Complicates overall care & prognosis   Symptomatic cholelithiasis: s/p cholecystectomy on 02/01/23      DVT prophylaxis: SCDs Code Status: full  Family Communication: discussed pt's care w/ pt's friend, Pilar Plate, and answered his questions Disposition Plan: likely d/c back LTC tomorrow   Level of care: Progressive  Status is: Inpatient Remains inpatient appropriate because: severity of illness   Consultants:  General surg  Procedures:   Antimicrobials:   Subjective: Pt c/o chest pain  Objective: Vitals:   02/01/23 1830 02/01/23 1845 02/01/23 1939 02/02/23 0500  BP: (!) 136/106 125/84 133/80 131/78  Pulse: (!) 108 (!) 109 (!) 110 (!) 105  Resp: 11 16 19 17   Temp:   98 F (36.7 C) 97.8 F (36.6 C)  TempSrc:   Oral Oral  SpO2: 93% 95% 95% 97%  Weight:      Height:        Intake/Output Summary (Last 24 hours) at 02/02/2023 0814 Last data filed at 02/01/2023 1436 Gross per 24 hour  Intake 800 ml  Output 20 ml   Net 780 ml   Filed Weights   02/01/23 1145  Weight: 102.3 kg    Examination:  General exam: Appears calm and comfortable  Respiratory system: Clear to auscultation. Respiratory effort normal. Cardiovascular system: S1 & S2+. No rubs, gallops or clicks.  Gastrointestinal system: Abdomen is nondistended, soft and nontender.  Normal bowel sounds heard. Central nervous system: Alert and awake. Psychiatry: Judgement and insight appears at baseline. Flat mood and affect    Data Reviewed: I have personally reviewed following labs and imaging studies  CBC: Recent Labs  Lab 02/01/23 1815 02/02/23 0646  WBC 9.6 8.1  NEUTROABS 8.8*  --   HGB 13.4 11.8*  HCT 38.8 33.3*  MCV 87.8 86.9  PLT 255 0000000   Basic Metabolic Panel: Recent Labs  Lab 02/01/23 1815 02/02/23 0646  NA 137 137  K 4.2 4.1  CL 102 106  CO2 23 25  GLUCOSE 148* 140*  BUN 20 21*  CREATININE 0.71 0.58  CALCIUM 9.0 8.8*  MG 1.7  --   PHOS 4.8*  --    GFR: Estimated Creatinine Clearance: 95.1 mL/min (by C-G formula based on SCr of 0.58 mg/dL). Liver Function Tests: Recent Labs  Lab 02/01/23 1815  AST 66*  ALT 52*  ALKPHOS 174*  BILITOT 0.4  PROT 7.8  ALBUMIN 3.8   No results for input(s): "LIPASE", "AMYLASE" in the last 168 hours. No results for input(s): "AMMONIA" in the last 168 hours.  Coagulation Profile: No results for input(s): "INR", "PROTIME" in the last 168 hours. Cardiac Enzymes: No results for input(s): "CKTOTAL", "CKMB", "CKMBINDEX", "TROPONINI" in the last 168 hours. BNP (last 3 results) No results for input(s): "PROBNP" in the last 8760 hours. HbA1C: No results for input(s): "HGBA1C" in the last 72 hours. CBG: No results for input(s): "GLUCAP" in the last 168 hours. Lipid Profile: No results for input(s): "CHOL", "HDL", "LDLCALC", "TRIG", "CHOLHDL", "LDLDIRECT" in the last 72 hours. Thyroid Function Tests: No results for input(s): "TSH", "T4TOTAL", "FREET4", "T3FREE", "THYROIDAB"  in the last 72 hours. Anemia Panel: No results for input(s): "VITAMINB12", "FOLATE", "FERRITIN", "TIBC", "IRON", "RETICCTPCT" in the last 72 hours. Sepsis Labs: Recent Labs  Lab 02/01/23 1815  PROCALCITON <0.10    Recent Results (from the past 240 hour(s))  Resp panel by RT-PCR (RSV, Flu A&B, Covid) Anterior Nasal Swab     Status: None   Collection Time: 01/24/23  9:27 AM   Specimen: Anterior Nasal Swab  Result Value Ref Range Status   SARS Coronavirus 2 by RT PCR NEGATIVE NEGATIVE Final    Comment: (NOTE) SARS-CoV-2 target nucleic acids are NOT DETECTED.  The SARS-CoV-2 RNA is generally detectable in upper respiratory specimens during the acute phase of infection. The lowest concentration of SARS-CoV-2 viral copies this assay can detect is 138 copies/mL. A negative result does not preclude SARS-Cov-2 infection and should not be used as the sole basis for treatment or other patient management decisions. A negative result may occur with  improper specimen collection/handling, submission of specimen other than nasopharyngeal swab, presence of viral mutation(s) within the areas targeted by this assay, and inadequate number of viral copies(<138 copies/mL). A negative result must be combined with clinical observations, patient history, and epidemiological information. The expected result is Negative.  Fact Sheet for Patients:  EntrepreneurPulse.com.au  Fact Sheet for Healthcare Providers:  IncredibleEmployment.be  This test is no t yet approved or cleared by the [place] FDA and  has been authorized for detection and/or diagnosis of SARS-CoV-2 by FDA under an Emergency Use Authorization (EUA). This EUA will remain  in effect (meaning this test can be used) for the duration of the COVID-19 declaration under Section 564(b)(1) of the Act, 21 U.S.C.section 360bbb-3(b)(1), unless the authorization is terminated  or revoked sooner.        Influenza A by PCR NEGATIVE NEGATIVE Final   Influenza B by PCR NEGATIVE NEGATIVE Final    Comment: (NOTE) The Xpert Xpress SARS-CoV-2/FLU/RSV plus assay is intended as an aid in the diagnosis of influenza from Nasopharyngeal swab specimens and should not be used as a sole basis for treatment. Nasal washings and aspirates are unacceptable for Xpert Xpress SARS-CoV-2/FLU/RSV testing.  Fact Sheet for Patients: EntrepreneurPulse.com.au  Fact Sheet for Healthcare Providers: IncredibleEmployment.be  This test is not yet approved or cleared by the [place] FDA and has been authorized for detection and/or diagnosis of SARS-CoV-2 by FDA under an Emergency Use Authorization (EUA). This EUA will remain in effect (meaning this test can be used) for the duration of the COVID-19 declaration under Section 564(b)(1) of the Act, 21 U.S.C. section 360bbb-3(b)(1), unless the authorization is terminated or revoked.     Resp Syncytial Virus by PCR NEGATIVE NEGATIVE Final    Comment: (NOTE) Fact Sheet for Patients: EntrepreneurPulse.com.au  Fact Sheet for Healthcare Providers: IncredibleEmployment.be  This test is not yet approved or cleared by the [place] FDA and has been authorized for detection and/or diagnosis of SARS-CoV-2 by FDA  under an Emergency Use Authorization (EUA). This EUA will remain in effect (meaning this test can be used) for the duration of the COVID-19 declaration under Section 564(b)(1) of the Act, 21 U.S.C. section 360bbb-3(b)(1), unless the authorization is terminated or revoked.  Performed at [facility], [address] [place], [place] [address]   Resp panel by RT-PCR (RSV, Flu A&B, Covid) Anterior Nasal Swab     Status: None   Collection Time: 02/02/23  1:14 AM   Specimen: Anterior Nasal Swab  Result Value Ref Range Status   SARS Coronavirus 2 by RT PCR NEGATIVE NEGATIVE  Final    Comment: (NOTE) SARS-CoV-2 target nucleic acids are NOT DETECTED.  The SARS-CoV-2 RNA is generally detectable in upper respiratory specimens during the acute phase of infection. The lowest concentration of SARS-CoV-2 viral copies this assay can detect is 138 copies/mL. A negative result does not preclude SARS-Cov-2 infection and should not be used as the sole basis for treatment or other patient management decisions. A negative result may occur with  improper specimen collection/handling, submission of specimen other than nasopharyngeal swab, presence of viral mutation(s) within the areas targeted by this assay, and inadequate number of viral copies(<138 copies/mL). A negative result must be combined with clinical observations, patient history, and epidemiological information. The expected result is Negative.  Fact Sheet for Patients:  EntrepreneurPulse.com.au  Fact Sheet for Healthcare Providers:  IncredibleEmployment.be  This test is no t yet approved or cleared by the [place] FDA and  has been authorized for detection and/or diagnosis of SARS-CoV-2 by FDA under an Emergency Use Authorization (EUA). This EUA will remain  in effect (meaning this test can be used) for the duration of the COVID-19 declaration under Section 564(b)(1) of the Act, 21 U.S.C.section 360bbb-3(b)(1), unless the authorization is terminated  or revoked sooner.       Influenza A by PCR NEGATIVE NEGATIVE Final   Influenza B by PCR NEGATIVE NEGATIVE Final    Comment: (NOTE) The Xpert Xpress SARS-CoV-2/FLU/RSV plus assay is intended as an aid in the diagnosis of influenza from Nasopharyngeal swab specimens and should not be used as a sole basis for treatment. Nasal washings and aspirates are unacceptable for Xpert Xpress SARS-CoV-2/FLU/RSV testing.  Fact Sheet for Patients: EntrepreneurPulse.com.au  Fact Sheet for Healthcare  Providers: IncredibleEmployment.be  This test is not yet approved or cleared by the [place] FDA and has been authorized for detection and/or diagnosis of SARS-CoV-2 by FDA under an Emergency Use Authorization (EUA). This EUA will remain in effect (meaning this test can be used) for the duration of the COVID-19 declaration under Section 564(b)(1) of the Act, 21 U.S.C. section 360bbb-3(b)(1), unless the authorization is terminated or revoked.     Resp Syncytial Virus by PCR NEGATIVE NEGATIVE Final    Comment: (NOTE) Fact Sheet for Patients: EntrepreneurPulse.com.au  Fact Sheet for Healthcare Providers: IncredibleEmployment.be  This test is not yet approved or cleared by the [place] FDA and has been authorized for detection and/or diagnosis of SARS-CoV-2 by FDA under an Emergency Use Authorization (EUA). This EUA will remain in effect (meaning this test can be used) for the duration of the COVID-19 declaration under Section 564(b)(1) of the Act, 21 U.S.C. section 360bbb-3(b)(1), unless the authorization is terminated or revoked.  Performed at [facility], [address] [place], [place] [address]          Radiology Studies: [facility] Chest [place] 1 View  Result Date: 02/01/2023 CLINICAL DATA:  Shortness of  breath EXAM: PORTABLE CHEST 1 VIEW COMPARISON:  Chest x-ray dated January 24, 2023 FINDINGS: Cardiac and mediastinal contours are unchanged. Low lung volumes. Bibasilar opacities, likely due to atelectasis. Elevation of the right hemidiaphragm. No evidence of pleural effusion or pneumothorax. IMPRESSION: Low lung volumes with bibasilar atelectasis, findings are unchanged when compared with the prior exam. Electronically Signed   By: Yetta Glassman M.D.   On: 02/01/2023 17:25        Scheduled Meds:  allopurinol  100 mg Oral Daily   aspirin EC  81 mg Oral Daily   busPIRone  5 mg Oral TID   Chlorhexidine  Gluconate Cloth  6 each Topical Once   DULoxetine  60 mg Oral BID   gabapentin  600 mg Oral TID   lactulose  30 g Oral Daily   mirtazapine  30 mg Oral QHS   ursodiol  300 mg Oral TID   Continuous Infusions:   LOS: 1 day    Time spent: 25 mins    Wyvonnia Dusky, MD [facility] Pager [phone]  If 7PM-7AM, please contact night-coverage [email or web address] 02/02/2023, 8:14 AM

## 2023-02-03 DIAGNOSIS — J9601 Acute respiratory failure with hypoxia: Secondary | ICD-10-CM | POA: Diagnosis not present

## 2023-02-03 DIAGNOSIS — E669 Obesity, unspecified: Secondary | ICD-10-CM

## 2023-02-03 DIAGNOSIS — K8020 Calculus of gallbladder without cholecystitis without obstruction: Secondary | ICD-10-CM | POA: Diagnosis not present

## 2023-02-03 LAB — SURGICAL PATHOLOGY

## 2023-02-03 MED ORDER — OXYCODONE HCL 5 MG PO TABS: 5.0000 mg | ORAL_TABLET | ORAL | 0 refills | Status: DC | PRN

## 2023-02-03 NOTE — Progress Notes (Signed)
Report was given to Eber Hong at [facility]. Questions were answered and encourage. Call back number given.

## 2023-02-03 NOTE — TOC Transition Note (Signed)
Transition of Care [facility]) - CM/SW Discharge Note   Patient Details  Name: [name] MRN: [id number] Date of Birth: 1970-11-18  [facility] CM/SW Contact:  Beverly Sessions, RN Phone Number: 02/03/2023, 2:16 PM   Clinical Narrative:    Patient will DC to: [facility] Anticipated DC date:02/03/23  Family notified: [name] by: Johnanna Schneiders  Per MD patient ready for DC to . RN, patient, patient's family, and facility notified of DC. Discharge Summary sent to facility. RN given number for report. DC packet on chart. Ambulance transport requested for patient.  Bedside RN to send signed scripts in discharge packet  TOC signing off.  Isaias Cowman [facility] [phone]          Patient Goals and CMS Choice      Discharge Placement                         Discharge Plan and Services Additional resources added to the After Visit Summary for                                       Social Determinants of Health (SDOH) Interventions SDOH Screenings   Food Insecurity: No Food Insecurity (02/01/2023)  Housing: Low Risk  (02/01/2023)  Transportation Needs: No Transportation Needs (02/01/2023)  Utilities: Not At Risk (02/01/2023)  Tobacco Use: Medium Risk (02/01/2023)     Readmission Risk Interventions     No data to display

## 2023-02-03 NOTE — Plan of Care (Signed)

## 2023-02-03 NOTE — Discharge Summary (Signed)
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 08/30/2014 1510   PHURINE 7.0 10/31/2022 2107   GLUCOSEU NEGATIVE 10/31/2022 2107   GLUCOSEU Negative 08/30/2014 1510   HGBUR NEGATIVE 10/31/2022 2107   BILIRUBINUR NEGATIVE 10/31/2022 2107   BILIRUBINUR Negative 08/30/2014  [place] 10/31/2022 2107   PROTEINUR NEGATIVE 10/31/2022 2107   NITRITE NEGATIVE 10/31/2022 2107   LEUKOCYTESUR NEGATIVE 10/31/2022 2107   LEUKOCYTESUR Negative 08/30/2014 1510   Sepsis Labs Recent Labs  Lab 02/01/23 1815 02/02/23 0646  WBC 9.6 8.1   Microbiology Recent Results (from the past 240 hour(s))  Resp panel by RT-PCR (RSV, Flu A&B, Covid) Anterior Nasal Swab     Status: None   Collection Time: 02/02/23  1:14 AM    Specimen: Anterior Nasal Swab  Result Value Ref Range Status   SARS Coronavirus 2 by RT PCR NEGATIVE NEGATIVE Final    Comment: (NOTE) SARS-CoV-2 target nucleic acids are NOT DETECTED.  The SARS-CoV-2 RNA is generally detectable in upper respiratory specimens during the acute phase of infection. The lowest concentration of SARS-CoV-2 viral copies this assay can detect is 138 copies/mL. A negative result does not preclude SARS-Cov-2 infection and should not be used as the sole basis for treatment or other patient management decisions. A negative result may occur with  improper specimen collection/handling, submission of specimen other than nasopharyngeal swab, presence of viral mutation(s) within the areas targeted by this assay, and inadequate number of viral copies(<138 copies/mL). A negative result must be combined with clinical observations, patient history, and epidemiological information. The expected result is Negative.  Fact Sheet for Patients:  EntrepreneurPulse.com.au  Fact Sheet for Healthcare Providers:  IncredibleEmployment.be  This test is no t yet approved or cleared by the [place] FDA and  has been authorized for detection and/or diagnosis of SARS-CoV-2 by FDA under an Emergency Use Authorization (EUA). This EUA will remain  in effect (meaning this test can be used) for the duration of the COVID-19 declaration under Section 564(b)(1) of the Act, 21 U.S.C.section 360bbb-3(b)(1), unless the authorization is terminated  or revoked sooner.       Influenza A by PCR NEGATIVE NEGATIVE Final   Influenza B by PCR NEGATIVE NEGATIVE Final    Comment: (NOTE) The Xpert Xpress SARS-CoV-2/FLU/RSV plus assay is intended as an aid in the diagnosis of influenza from Nasopharyngeal swab specimens and should not be used as a sole basis for treatment. Nasal washings and aspirates are unacceptable for Xpert Xpress  SARS-CoV-2/FLU/RSV testing.  Fact Sheet for Patients: EntrepreneurPulse.com.au  Fact Sheet for Healthcare Providers: IncredibleEmployment.be  This test is not yet approved or cleared by the [place] FDA and has been authorized for detection and/or diagnosis of SARS-CoV-2 by FDA under an Emergency Use Authorization (EUA). This EUA will remain in effect (meaning this test can be used) for the duration of the COVID-19 declaration under Section 564(b)(1) of the Act, 21 U.S.C. section 360bbb-3(b)(1), unless the authorization is terminated or revoked.     Resp Syncytial Virus by PCR NEGATIVE NEGATIVE Final    Comment: (NOTE) Fact Sheet for Patients: EntrepreneurPulse.com.au  Fact Sheet for Healthcare Providers: IncredibleEmployment.be  This test is not yet approved or cleared by the [place] FDA and has been authorized for detection and/or diagnosis of SARS-CoV-2 by FDA under an Emergency Use Authorization (EUA). This EUA will remain in effect (meaning this test can be used) for the duration of the COVID-19 declaration under Section 564(b)(1) of the Act, 21 U.S.C. section 360bbb-3(b)(1), unless the authorization is terminated or revoked.  Performed at [facility], [address] [place], [place] [address]      Time coordinating discharge: Over 30 minutes  SIGNED:   Wyvonnia Dusky, MD  [facility] 02/03/2023, 12:26  PM Pager   If 7PM-7AM, please contact night-coverage [email or web address]

## 2023-02-15 ENCOUNTER — Encounter: Payer: Self-pay | Admitting: Nurse Practitioner

## 2023-02-15 ENCOUNTER — Non-Acute Institutional Stay: Payer: Medicaid Other | Admitting: Nurse Practitioner

## 2023-02-15 ENCOUNTER — Encounter: Payer: Medicaid Other | Admitting: Physician Assistant

## 2023-02-15 DIAGNOSIS — R4701 Aphasia: Secondary | ICD-10-CM

## 2023-02-15 DIAGNOSIS — Z515 Encounter for palliative care: Secondary | ICD-10-CM

## 2023-02-15 DIAGNOSIS — R5381 Other malaise: Secondary | ICD-10-CM

## 2023-02-15 DIAGNOSIS — I639 Cerebral infarction, unspecified: Secondary | ICD-10-CM

## 2023-02-15 DIAGNOSIS — G8191 Hemiplegia, unspecified affecting right dominant side: Secondary | ICD-10-CM

## 2023-02-15 NOTE — Progress Notes (Signed)
[facility] Consult Note Telephone: [phone]  Fax: [phone]    Date of encounter: 02/15/23 3:05 PM PATIENT NAME: Gloria Aguilar [place] [address]   [phone] (home)  DOB: 02/15/1971 MRN: [id number] PRIMARY CARE PROVIDER:    [facility]  RESPONSIBLE PARTY:    Contact Information     Name Relation Home Work Mobile   [place] [phone]     [name],[name] Daughter [phone]       I met face to face with patient in facility. Palliative Care was asked to follow this patient by consultation request of [facility] to address advance care planning and complex medical decision making. This is the initial visit.                               ASSESSMENT AND PLAN / RECOMMENDATIONS:  Symptom Management/Plan: 1. Advance Care Planning;  Ongoing discussions 2. Goals of Care: Goals include to maximize quality of life and symptom management. Our advance care planning conversation included a discussion about:    The value and importance of advance care planning  Exploration of personal, cultural or spiritual beliefs that might influence medical decisions  Exploration of goals of care in the event of a sudden injury or illness  Identification and preparation of a healthcare agent  Review and updating or creation of an advance directive document. 3. Palliative care encounter; Palliative care encounter; Palliative medicine team will continue to support patient, patient's family, and medical team. Visit consisted of counseling and education dealing with the complex and emotionally intense issues of symptom management and palliative care in the setting of serious and potentially life-threatening illness   4. Debility with aphasia/hemiplegia secondary to CVA;  continue supportive care, encourage mobility, therapy to participate as able, then restorative, socialization, encourage activities, supportive role. Cut up  Gloria Aguilar chicken on her try, observed while she used her fingers to feed herself. No s/s aspiration or choking. Praised Gloria Aguilar for independence as able.    Follow up Palliative Care Visit: Palliative care will continue to follow for complex medical decision making, advance care planning, and clarification of goals. Return 4 to 8 weeks or prn.   I spent 45 minutes providing this consultation. More than 50% of the time in this consultation was spent in counseling and care coordination. PPS: 40% Chief Complaint: Initial palliative consult for complex medical decision making, address goals, manage ongoing symptoms   HISTORY OF PRESENT ILLNESS:  Gloria Aguilar is a 52 y.o. 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 Gloria. Aguilar currently resides at [facility], requires assistance with transfers, mobility, adl's including dressing, bathing, toileting. Gloria. Aguilar requires assistance with feeding. Hospitalized from 02/01/2023 to 02/03/2023 for symptomatic cholelithiasis s/p cholecystectomy with developed acute hypoxic respiratory failure stabilized and d/c back to [facility] where Gloria Aguilar resides. Purpose of today PC f/u visit further discussion monitor trends of appetite, weights, monitor for functional, cognitive decline with chronic disease progression, assess any active symptoms, supportive role. Medications, poc, goc reviewed. At present Gloria Aguilar is sitting in the w/c with her [place] with her. We talked about recent gallbladder sgy, had appointment with surgeon scheduled for today. We talked about ros, though limited with cognitive impairment. We talked about how she has been doing. We talked about  functional abilities, residing facility. Medications, goc, poc reviewed. No new changes, no recent falls, wounds, infections. Gloria Aguilar continues to smoke and  declines smoking cessation. Supportive visit. Gloria Aguilar currently appears stable.    History obtained from review of EMR, discussion with primary team, and interview with family, facility staff/caregiver and/or Gloria. Aguilar.  I reviewed available labs, medications, imaging, studies and related documents from the EMR.  Records reviewed and summarized above.    Physical Exam: Constitutional: NAD General: obese, debilitated, female ENMT: oral mucous membranes moist CV: S1S2, RRR Pulmonary: LCTA MSK: w/c dependent Neuro:  + contracture right forearm/left foot; + cognitive impairment Psych: non-anxious affect, A and Oriented to self, place Thank you for the opportunity to participate in the care of Gloria. Aguilar. Please call our office at [phone] if we can be of additional assistance.   Nazareth Norenberg Ihor Gully, NP

## 2023-02-16 ENCOUNTER — Encounter: Payer: Self-pay | Admitting: Physician Assistant

## 2023-02-16 ENCOUNTER — Ambulatory Visit (INDEPENDENT_AMBULATORY_CARE_PROVIDER_SITE_OTHER): Payer: Medicaid Other | Admitting: Physician Assistant

## 2023-02-16 VITALS — BP 120/87 | HR 102 | Temp 98.3°F | Ht 63.0 in | Wt 227.0 lb

## 2023-02-16 DIAGNOSIS — Z09 Encounter for follow-up examination after completed treatment for conditions other than malignant neoplasm: Secondary | ICD-10-CM

## 2023-02-16 DIAGNOSIS — K8020 Calculus of gallbladder without cholecystitis without obstruction: Secondary | ICD-10-CM

## 2023-02-16 NOTE — Progress Notes (Signed)
[facility] POST-OP OFFICE VISIT  02/16/2023  HPI: Gloria Aguilar is a 52 y.o. female 15 days s/p robotic assisted laparoscopic cholecystectomy for symptomatic cholelithiasis with Dr Hampton Abbot  From a gallbladder perspective, she is doing well No abdominal pain No fever, chills, nausea, emesis She is having looser stool but no frank diarrhea Incisions well healed  Vital signs: BP 120/87   Pulse (!) 102   Temp 98.3 F (36.8 C)   Ht 5\' 3"  (1.6 m)   Wt 227 lb (103 kg)   LMP 12/01/2019   SpO2 98%   BMI 40.21 kg/m    Physical Exam: Constitutional: Well appearing female, NAD Abdomen: Soft, non-tender, non-distended, no rebound/guarding Skin: Laparoscopic incisions are healing well, no erythema or drainage   Assessment/Plan: This is a 52 y.o. female 15 days s/p robotic assisted laparoscopic cholecystectomy for symptomatic cholelithiasis with Dr Hampton Abbot   - Faythe Ghee to trial imodium +/- metamucil for loose stool if needed   - Pain control prn  - Reviewed wound care recommendation  - Reviewed surgical pathology; [place]  - She can follow up on as needed basis; She understands to call with questions/concerns  -- Edison Simon, PA-C [facility] 02/16/2023, 11:20 AM M-F: 7am - 4pm

## 2023-02-16 NOTE — Patient Instructions (Signed)

## 2023-04-29 ENCOUNTER — Non-Acute Institutional Stay: Payer: Medicaid Other | Admitting: Nurse Practitioner

## 2023-04-29 DIAGNOSIS — G8191 Hemiplegia, unspecified affecting right dominant side: Secondary | ICD-10-CM

## 2023-04-29 DIAGNOSIS — R4701 Aphasia: Secondary | ICD-10-CM

## 2023-04-29 DIAGNOSIS — Z515 Encounter for palliative care: Secondary | ICD-10-CM

## 2023-04-29 DIAGNOSIS — I639 Cerebral infarction, unspecified: Secondary | ICD-10-CM

## 2023-04-29 DIAGNOSIS — R5381 Other malaise: Secondary | ICD-10-CM

## 2023-04-29 NOTE — Progress Notes (Signed)
Therapist, nutritional Palliative Care Consult Note Telephone: [phone]  Fax: [phone]    Date of encounter: 04/29/23 3:29 PM PATIENT NAME: Onedia Niemeier [address] [place] [address]   [phone] (home)  DOB: September 16, 1971 MRN: [id number] PRIMARY CARE PROVIDER:    [facility]  RESPONSIBLE PARTY:    Contact Information     Name Relation Home Work Mobile   [place] Friend [phone]     [name],[name] Daughter [phone]        I met face to face with patient in facility. Palliative Care was asked to follow this patient by consultation request of [facility] to address advance care planning and complex medical decision making. This is the initial visit.                               ASSESSMENT AND PLAN / RECOMMENDATIONS:  Symptom Management/Plan: 1. Advance Care Planning;  Ongoing discussions 2.  Palliative care encounter; Palliative care encounter; Palliative medicine team will continue to support patient, patient's family, and medical team. Visit consisted of counseling and education dealing with the complex and emotionally intense issues of symptom management and palliative care in the setting of serious and potentially life-threatening illness   3. Debility with aphasia/hemiplegia secondary to CVA;  continue supportive care, encourage mobility, therapy to participate as able, then restorative, socialization, encourage activities, supportive role. Cut up Ms [place] chicken on her try, observed while she used her fingers to feed herself. No s/s aspiration or choking. Continue supportive care   Follow up Palliative Care Visit: Palliative care will continue to follow for complex medical decision making, advance care planning, and clarification of goals. Return 2 to 8 weeks or prn.   I spent 48 minutes providing this consultation. More than 50% of the time in this consultation was spent in counseling and care coordination. PPS: 40% Chief  Complaint: Follow up palliative consult for complex medical decision making, address goals, manage ongoing symptoms   HISTORY OF PRESENT ILLNESS:  Suriya Baldy is a 52 y.o. 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 Ms. Tygart currently resides at [facility], requires assistance with transfers, mobility, adl's including dressing, bathing, toileting. Ms. Brafford requires assistance with feeding. Hospitalized from 02/01/2023 to 02/03/2023 for symptomatic cholelithiasis s/p cholecystectomy with developed acute hypoxic respiratory failure stabilized and d/c back to [facility] where Ms Protsman resides. Purpose of today PC f/u visit further discussion monitor trends of appetite, weights, monitor for functional, cognitive decline with chronic disease progression, assess any active symptoms, supportive role. Medications, poc, goc reviewed. At present Ms Schuknecht is sitting in the w/c in her room, currently wearing O2. Ms Washa endorses overall she has not been feeling well, Ms Reida continues to smoke and declines smoking cessation. Ms Carreon aphasic with communication limited, talked about ros, nodded her head. Reviewed medications, poc, goc. Supportive visit. PC f/u visit further discussion monitor trends of appetite, weights, monitor for functional, cognitive decline with chronic disease progression, assess any active symptoms, supportive role. History obtained from review of EMR, discussion with primary team, and interview with family, facility staff/caregiver and/or Ms. Pae.  I reviewed available labs, medications, imaging, studies and related documents from the EMR.  Records reviewed and summarized above.    Physical Exam: Constitutional: NAD General: obese, debilitated, female ENMT: oral mucous membranes moist  CV: S1S2, RRR Pulmonary: LCTA MSK: w/c dependent Neuro:  +  contracture right forearm/left foot; + cognitive impairment Psych: non-anxious affect, A and Oriented to self, place Thank you for the opportunity to participate in the care of Ms. Hirschhorn. Please call our office at [phone] if we can be of additional assistance.   Adlee Paar Prince [place], NP

## 2023-06-18 ENCOUNTER — Emergency Department: Payer: Medicaid Other

## 2023-06-18 ENCOUNTER — Emergency Department
Admission: EM | Admit: 2023-06-18 | Discharge: 2023-06-18 | Disposition: A | Discharge status: Skilled Nursing Facility | Payer: Medicaid Other | Attending: Emergency Medicine | Admitting: Emergency Medicine

## 2023-06-18 ENCOUNTER — Other Ambulatory Visit: Payer: Self-pay

## 2023-06-18 DIAGNOSIS — Z8673 Personal history of transient ischemic attack (TIA), and cerebral infarction without residual deficits: Secondary | ICD-10-CM | POA: Insufficient documentation

## 2023-06-18 DIAGNOSIS — M79674 Pain in right toe(s): Secondary | ICD-10-CM | POA: Diagnosis present

## 2023-06-18 LAB — URIC ACID: Uric Acid, Serum: 4.7 mg/dL (ref 2.5–7.1)

## 2023-06-18 LAB — CBC WITH DIFFERENTIAL/PLATELET
Abs Immature Granulocytes: 0.01 10*3/uL (ref 0.00–0.07)
Basophils Absolute: 0 10*3/uL (ref 0.0–0.1)
Basophils Relative: 1 %
Eosinophils Absolute: 0.3 10*3/uL (ref 0.0–0.5)
Eosinophils Relative: 6 %
HCT: 38.8 % (ref 36.0–46.0)
Hemoglobin: 13.9 g/dL (ref 12.0–15.0)
Immature Granulocytes: 0 %
Lymphocytes Relative: 28 %
Lymphs Abs: 1.5 10*3/uL (ref 0.7–4.0)
MCH: 31.7 pg (ref 26.0–34.0)
MCHC: 35.8 g/dL (ref 30.0–36.0)
MCV: 88.4 fL (ref 80.0–100.0)
Monocytes Absolute: 0.3 10*3/uL (ref 0.1–1.0)
Monocytes Relative: 6 %
Neutro Abs: 3.3 10*3/uL (ref 1.7–7.7)
Neutrophils Relative %: 59 %
Platelets: 251 10*3/uL (ref 150–400)
RBC: 4.39 MIL/uL (ref 3.87–5.11)
RDW: 14 % (ref 11.5–15.5)
WBC: 5.5 10*3/uL (ref 4.0–10.5)
nRBC: 0 % (ref 0.0–0.2)

## 2023-06-18 LAB — BASIC METABOLIC PANEL
Anion gap: 9 (ref 5–15)
BUN: 19 mg/dL (ref 6–20)
CO2: 25 mmol/L (ref 22–32)
Calcium: 9.2 mg/dL (ref 8.9–10.3)
Chloride: 104 mmol/L (ref 98–111)
Creatinine, Ser: 0.6 mg/dL (ref 0.44–1.00)
GFR, Estimated: >60 mL/min (ref >60)
Glucose, Bld: 98 mg/dL (ref 70–99)
Potassium: 3.8 mmol/L (ref 3.5–5.1)
Sodium: 138 mmol/L (ref 135–145)

## 2023-06-18 MED ORDER — SODIUM CHLORIDE 0.9 % IV BOLUS: 1000.0000 mL | Freq: Once | INTRAVENOUS | Status: DC

## 2023-06-18 MED ORDER — OXYCODONE-ACETAMINOPHEN 5-325 MG PO TABS: 1.0000 | ORAL_TABLET | ORAL | 0 refills | Status: DC | PRN

## 2023-06-18 MED ORDER — ONDANSETRON HCL 4 MG/2ML IJ SOLN
4.0000 mg | Freq: Once | INTRAMUSCULAR | Status: AC
  Administered 2023-06-18: 4 mg via INTRAVENOUS

## 2023-06-18 MED ORDER — OXYCODONE-ACETAMINOPHEN 5-325 MG PO TABS
1.0000 | ORAL_TABLET | Freq: Once | ORAL | Status: AC
  Administered 2023-06-18: 1 via ORAL
  Filled 2023-06-18: qty 1

## 2023-06-18 MED ORDER — ONDANSETRON HCL 4 MG/2ML IJ SOLN
4.0000 mg | Freq: Once | INTRAMUSCULAR | Status: DC
  Filled 2023-06-18: qty 2

## 2023-06-18 MED ORDER — MORPHINE SULFATE (PF) 4 MG/ML IV SOLN
4.0000 mg | Freq: Once | INTRAVENOUS | Status: AC
  Administered 2023-06-18: 4 mg via INTRAVENOUS

## 2023-06-18 MED ORDER — KETOROLAC TROMETHAMINE 15 MG/ML IJ SOLN
15.0000 mg | Freq: Once | INTRAMUSCULAR | Status: DC
  Filled 2023-06-18: qty 1

## 2023-06-18 MED ORDER — MORPHINE SULFATE (PF) 4 MG/ML IV SOLN: 4.0000 mg | Freq: Once | INTRAVENOUS | Status: DC

## 2023-06-18 MED ORDER — DOXYCYCLINE HYCLATE 100 MG PO TABS: 100.0000 mg | ORAL_TABLET | Freq: Two times a day (BID) | ORAL | 0 refills | Status: DC

## 2023-06-18 MED ORDER — ONDANSETRON 4 MG PO TBDP: 4.0000 mg | ORAL_TABLET | Freq: Once | ORAL | Status: DC

## 2023-06-18 MED ORDER — MORPHINE SULFATE (PF) 4 MG/ML IV SOLN
4.0000 mg | Freq: Once | INTRAVENOUS | Status: DC
  Filled 2023-06-18: qty 1

## 2023-06-18 NOTE — ED Triage Notes (Signed)
Patient states pain and swelling to great right toe x 3 days

## 2023-06-18 NOTE — ED Notes (Addendum)
Report given to [facility] at [facility]

## 2023-06-18 NOTE — Discharge Instructions (Addendum)
Take the medications as prescribed.  Follow-up with podiatry.  Return emergency department worsening.  Do not let anyone at the facility cut your toenails at this time. Doxycycline will be twice daily for 10 days, Percocet for pain as needed every 4-6 hours

## 2023-06-18 NOTE — ED Provider Notes (Cosign Needed Addendum)
[facility] Provider Note    Event Date/Time   First MD Initiated Contact with Patient 06/18/23 1340     (approximate)   History   Toe Pain   HPI  Gloria Aguilar is a 52 y.o. female CVA,, IBS, right sided paralysis presents emergency department with right great toe pain.  Patient states has been hurting and the nurse cut her toenail ingrown tendon.  Now is very tender at left hip.  No fever or chills.      Physical Exam   Triage Vital Signs: ED Triage Vitals  Encounter Vitals Group     BP 06/18/23 1323 119/84     Systolic BP Percentile --      Diastolic BP Percentile --      Pulse Rate 06/18/23 1323 89     Resp 06/18/23 1323 20     Temp 06/18/23 1323 98.1 F (36.7 C)     Temp Source 06/18/23 1323 Oral     SpO2 06/18/23 1323 94 %     Weight --      Height --      Head Circumference --      Peak Flow --      Pain Score 06/18/23 1322 9     Pain Loc --      Pain Education --      Exclude from Growth Chart --     Most recent vital signs: Vitals:   06/18/23 1323  BP: 119/84  Pulse: 89  Resp: 20  Temp: 98.1 F (36.7 C)  SpO2: 94%     General: Awake, no distress.   CV:  Good peripheral perfusion. regular rate and  rhythm Resp:  Normal effort.  Abd:  No distention.   Other:  Right foot with swelling noted when compared to the left, right great toe tender to palpation, no pus or drainage noted   ED Results / Procedures / Treatments   Labs (all labs ordered are listed, but only abnormal results are displayed) Labs Reviewed  BASIC METABOLIC PANEL  CBC WITH DIFFERENTIAL/PLATELET  URIC ACID     EKG     RADIOLOGY X-ray of the right foot    PROCEDURES:   Procedures   MEDICATIONS ORDERED IN ED: Medications  oxyCODONE-acetaminophen (PERCOCET/ROXICET) 5-325 MG per tablet 1 tablet (has no administration in time range)  morphine (PF) 4 MG/ML injection 4 mg (4 mg Intravenous Given 06/18/23 1552)  ondansetron (ZOFRAN)  injection 4 mg (4 mg Intravenous Given 06/18/23 1552)     IMPRESSION / MDM / ASSESSMENT AND PLAN / ED COURSE  I reviewed the triage vital signs and the nursing notes.                              Differential diagnosis includes, but is not limited to, osteomyelitis, cellulitis, infected ingrown toenail, fracture  Patient's presentation is most consistent with acute presentation with potential threat to life or bodily function.   X-ray of the right foot, labs ordered  Patient was given pain medication, morphine 4 mg IV, Zofran 4 mg IV   X-ray of the right foot independently reviewed interpreted by me as being negative for any acute abnormality, labs are reassuring  Explained findings to patient.  She continues to have a lot of pain in the right great toe.  Unsure if this is actually a neuropathy versus infection.  Will start her on antibiotic, give her pain medication  and have her follow-up podiatry.  At this time it does not appear to be an ingrown toenail.  The area around the nail itself is not red or swollen.  ----------------------------------------- 6:33 PM on 06/18/2023 ----------------------------------------- Patient still awaiting EMS arrival to return her to the facility.  Now is complaining of headache and that she is hungry.  Will go ahead and let her have a dinner tray and do some normal saline IV along with 15 mg of Toradol.  FINAL CLINICAL IMPRESSION(S) / ED DIAGNOSES   Final diagnoses:  Great toe pain, right     Rx / DC Orders   ED Discharge Orders          Ordered    doxycycline (VIBRA-TABS) 100 MG tablet  2 times daily        06/18/23 1640    oxyCODONE-acetaminophen (PERCOCET) 5-325 MG tablet  Every 4 hours PRN        06/18/23 1640             Note:  This document was prepared using Dragon voice recognition software and may include unintentional dictation errors.    Faythe Ghee, PA-C 06/18/23 1642    Loleta Rose, MD 06/18/23 1653     Faythe Ghee, PA-C 06/18/23 Jerene Bears    Loleta Rose, MD 06/19/23 [phone]

## 2023-06-20 ENCOUNTER — Ambulatory Visit: Payer: Medicaid Other | Admitting: Podiatry

## 2023-06-21 ENCOUNTER — Ambulatory Visit: Payer: Medicaid Other | Admitting: Podiatry

## 2023-06-21 ENCOUNTER — Ambulatory Visit (INDEPENDENT_AMBULATORY_CARE_PROVIDER_SITE_OTHER): Payer: Medicaid Other | Admitting: Podiatry

## 2023-06-21 DIAGNOSIS — L600 Ingrowing nail: Secondary | ICD-10-CM
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
## 2023-06-21 NOTE — Patient Instructions (Signed)

## 2023-07-16 ENCOUNTER — Other Ambulatory Visit: Payer: Self-pay

## 2023-07-16 ENCOUNTER — Emergency Department
Admission: EM | Admit: 2023-07-16 | Discharge: 2023-07-17 | Disposition: A | Discharge status: Home / Self Care | Payer: Medicaid Other | Attending: Emergency Medicine | Admitting: Emergency Medicine

## 2023-07-16 DIAGNOSIS — S0990XA Unspecified injury of head, initial encounter: Secondary | ICD-10-CM | POA: Diagnosis not present

## 2023-07-16 DIAGNOSIS — F419 Anxiety disorder, unspecified: Secondary | ICD-10-CM | POA: Diagnosis not present

## 2023-07-16 DIAGNOSIS — X838XXA Intentional self-harm by other specified means, initial encounter: Secondary | ICD-10-CM | POA: Diagnosis not present

## 2023-07-16 DIAGNOSIS — Z7289 Other problems related to lifestyle: Secondary | ICD-10-CM

## 2023-07-16 DIAGNOSIS — S50811A Abrasion of right forearm, initial encounter: Secondary | ICD-10-CM | POA: Insufficient documentation

## 2023-07-16 DIAGNOSIS — F32A Depression, unspecified: Secondary | ICD-10-CM | POA: Diagnosis not present

## 2023-07-16 DIAGNOSIS — S069XAA Unspecified intracranial injury with loss of consciousness status unknown, initial encounter: Secondary | ICD-10-CM | POA: Diagnosis present

## 2023-07-16 DIAGNOSIS — S59911A Unspecified injury of right forearm, initial encounter: Secondary | ICD-10-CM | POA: Diagnosis present

## 2023-07-16 DIAGNOSIS — F418 Other specified anxiety disorders: Secondary | ICD-10-CM

## 2023-07-16 LAB — URINE DRUG SCREEN, QUALITATIVE (ARMC ONLY)
Amphetamines, Ur Screen: NOT DETECTED
Barbiturates, Ur Screen: NOT DETECTED
Benzodiazepine, Ur Scrn: POSITIVE — AB
Cannabinoid 50 Ng, Ur ~~LOC~~: POSITIVE — AB
Cocaine Metabolite,Ur ~~LOC~~: NOT DETECTED
MDMA (Ecstasy)Ur Screen: NOT DETECTED
Methadone Scn, Ur: NOT DETECTED
Opiate, Ur Screen: NOT DETECTED
Phencyclidine (PCP) Ur S: NOT DETECTED
Tricyclic, Ur Screen: NOT DETECTED

## 2023-07-16 LAB — COMPREHENSIVE METABOLIC PANEL
ALT: 45 U/L — ABNORMAL HIGH (ref 0–44)
AST: 39 U/L (ref 15–41)
Albumin: 4.1 g/dL (ref 3.5–5.0)
Alkaline Phosphatase: 151 U/L — ABNORMAL HIGH (ref 38–126)
Anion gap: 12 (ref 5–15)
BUN: 21 mg/dL — ABNORMAL HIGH (ref 6–20)
CO2: 24 mmol/L (ref 22–32)
Calcium: 9 mg/dL (ref 8.9–10.3)
Chloride: 101 mmol/L (ref 98–111)
Creatinine, Ser: 0.63 mg/dL (ref 0.44–1.00)
GFR, Estimated: >60 mL/min (ref >60)
Glucose, Bld: 89 mg/dL (ref 70–99)
Potassium: 3.7 mmol/L (ref 3.5–5.1)
Sodium: 137 mmol/L (ref 135–145)
Total Bilirubin: 0.6 mg/dL (ref 0.3–1.2)
Total Protein: 8 g/dL (ref 6.5–8.1)

## 2023-07-16 LAB — CBC
HCT: 39.7 % (ref 36.0–46.0)
Hemoglobin: 14.2 g/dL (ref 12.0–15.0)
MCH: 32.1 pg (ref 26.0–34.0)
MCHC: 35.8 g/dL (ref 30.0–36.0)
MCV: 89.6 fL (ref 80.0–100.0)
Platelets: 226 10*3/uL (ref 150–400)
RBC: 4.43 MIL/uL (ref 3.87–5.11)
RDW: 13.4 % (ref 11.5–15.5)
WBC: 4.8 10*3/uL (ref 4.0–10.5)
nRBC: 0 % (ref 0.0–0.2)

## 2023-07-16 LAB — SALICYLATE LEVEL: Salicylate Lvl: <7 mg/dL — ABNORMAL LOW (ref 7.0–30.0)

## 2023-07-16 LAB — ETHANOL: Alcohol, Ethyl (B): <10 mg/dL (ref <10)

## 2023-07-16 LAB — ACETAMINOPHEN LEVEL: Acetaminophen (Tylenol), Serum: <10 ug/mL — ABNORMAL LOW (ref 10–30)

## 2023-07-16 MED ORDER — ASPIRIN 81 MG PO TBEC
81.0000 mg | DELAYED_RELEASE_TABLET | Freq: Every day | ORAL | Status: DC
  Administered 2023-07-16: 81 mg via ORAL
  Filled 2023-07-16: qty 1

## 2023-07-16 MED ORDER — GABAPENTIN 300 MG PO CAPS
600.0000 mg | ORAL_CAPSULE | Freq: Three times a day (TID) | ORAL | Status: DC
  Administered 2023-07-16: 600 mg via ORAL
  Filled 2023-07-16: qty 2

## 2023-07-16 MED ORDER — MIRTAZAPINE 15 MG PO TABS
30.0000 mg | ORAL_TABLET | Freq: Every day | ORAL | Status: DC
  Administered 2023-07-16: 30 mg via ORAL
  Filled 2023-07-16: qty 2

## 2023-07-16 MED ORDER — LINACLOTIDE 145 MCG PO CAPS
145.0000 ug | ORAL_CAPSULE | Freq: Every day | ORAL | Status: DC
  Filled 2023-07-16: qty 1

## 2023-07-16 MED ORDER — METHOCARBAMOL 500 MG PO TABS
750.0000 mg | ORAL_TABLET | Freq: Three times a day (TID) | ORAL | Status: DC | PRN
  Administered 2023-07-17: 750 mg via ORAL
  Filled 2023-07-16: qty 2

## 2023-07-16 MED ORDER — ASPIRIN 81 MG PO TBEC: 81.0000 mg | DELAYED_RELEASE_TABLET | Freq: Three times a day (TID) | ORAL | Status: DC

## 2023-07-16 MED ORDER — OXYCODONE-ACETAMINOPHEN 5-325 MG PO TABS
1.0000 | ORAL_TABLET | ORAL | Status: DC | PRN
  Administered 2023-07-16 – 2023-07-17 (×3): 1 via ORAL
  Filled 2023-07-16 (×3): qty 1

## 2023-07-16 MED ORDER — DICYCLOMINE HCL 10 MG PO CAPS
10.0000 mg | ORAL_CAPSULE | Freq: Three times a day (TID) | ORAL | Status: DC
  Administered 2023-07-16: 10 mg via ORAL
  Filled 2023-07-16: qty 1

## 2023-07-16 MED ORDER — ALLOPURINOL 100 MG PO TABS
100.0000 mg | ORAL_TABLET | Freq: Every day | ORAL | Status: DC
  Administered 2023-07-17: 100 mg via ORAL
  Filled 2023-07-16: qty 1

## 2023-07-16 MED ORDER — ALUM & MAG HYDROXIDE-SIMETH 200-200-20 MG/5ML PO SUSP: 15.0000 mL | Freq: Three times a day (TID) | ORAL | Status: DC | PRN

## 2023-07-16 MED ORDER — DIPHENHYDRAMINE HCL 25 MG PO CAPS: 25.0000 mg | ORAL_CAPSULE | Freq: Two times a day (BID) | ORAL | Status: DC | PRN

## 2023-07-16 MED ORDER — BUSPIRONE HCL 5 MG PO TABS
5.0000 mg | ORAL_TABLET | Freq: Three times a day (TID) | ORAL | Status: DC
  Administered 2023-07-16: 5 mg via ORAL
  Filled 2023-07-16: qty 1

## 2023-07-16 MED ORDER — CLONAZEPAM 0.5 MG PO TABS: 1.5000 mg | ORAL_TABLET | Freq: Two times a day (BID) | ORAL | Status: DC | PRN

## 2023-07-16 MED ORDER — ALBUTEROL SULFATE (2.5 MG/3ML) 0.083% IN NEBU: 3.0000 mL | INHALATION_SOLUTION | RESPIRATORY_TRACT | Status: DC | PRN

## 2023-07-16 MED ORDER — DULOXETINE HCL 60 MG PO CPEP
60.0000 mg | ORAL_CAPSULE | Freq: Two times a day (BID) | ORAL | Status: DC
  Administered 2023-07-16: 60 mg via ORAL
  Filled 2023-07-16: qty 1
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
## 2023-07-16 NOTE — ED Notes (Signed)
Placed order for a meal tray.
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
## 2023-07-16 NOTE — ED Notes (Signed)
Assisted pt to bedside commode. Linens were changed. Brief applied. Pt's pain level was assessed. 1 Prn percocet was given. Pt assisted back to bed. Spoke to [place], her power of attorney to update.
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
## 2023-07-16 NOTE — ED Provider Notes (Signed)
   [facility] Provider Note    Event Date/Time   First MD Initiated Contact with Patient 07/16/23 1525     (approximate)  History   Chief Complaint: Laceration  HPI  Gloria Aguilar is a 52 y.o. female with a past medical history anxiety, prior CVA with aphasia right-sided deficits, depression, presents to the emergency department for possible self-mutilation.  According to EMS patient is coming from [facility] care for possible self-mutilation with cuts to her right arm.  Patient has aphasia and cannot contribute much to her history although will answer questions by shaking her head at times.  Per report patient's daughter had noted that the patient is depressed and is unhappy at her current rehab facility.  Physical Exam   Triage Vital Signs: ED Triage Vitals  Encounter Vitals Group     BP 07/16/23 1242 109/78     Systolic BP Percentile --      Diastolic BP Percentile --      Pulse Rate 07/16/23 1242 88     Resp 07/16/23 1242 18     Temp 07/16/23 1242 97.9 F (36.6 C)     Temp Source 07/16/23 1242 Oral     SpO2 07/16/23 1242 100 %     Weight --      Height --      Head Circumference --      Peak Flow --      Pain Score 07/16/23 1243 3     Pain Loc --      Pain Education --      Exclude from Growth Chart --     Most recent vital signs: Vitals:   07/16/23 1242  BP: 109/78  Pulse: 88  Resp: 18  Temp: 97.9 F (36.6 C)  SpO2: 100%    General: Awake, no distress.  CV:  Good peripheral perfusion.  Regular rate and rhythm  Resp:  Normal effort.  Equal breath sounds bilaterally.  Abd:  No distention.  Soft, nontender.  No rebound or guarding. Other:  Patient appears to have right-sided deficits.  Patient has superficial cuts to her right forearm none of which are bleeding gaping will require repair.   ED Results / Procedures / Treatments   MEDICATIONS ORDERED IN ED: Medications - No data to display   IMPRESSION / MDM / ASSESSMENT AND  PLAN / ED COURSE  I reviewed the triage vital signs and the nursing notes.  Patient's presentation is most consistent with acute presentation with potential threat to life or bodily function.  Patient presents emergency department for possible self-mutilation.  She has several superficial abrasions/scrapes to her right forearm/wrist.  None of which are deep none of which are bleeding none of which require repair.  Patient has aphasia thus a very difficult history patient keeps repeating that she "just wants to go" but she will not really specify what this means.  Patient has mobility issues due to her prior CVA.  We will have psychiatry TTS evaluate.  Patient's lab work is reassuring showing a normal CBC reassuring chemistry negative acetaminophen salicylate and alcohol level.  FINAL CLINICAL IMPRESSION(S) / ED DIAGNOSES   Depression Self-mutilation   Note:  This document was prepared using Dragon voice recognition software and may include unintentional dictation errors.   Minna Antis, MD 07/16/23 1535

## 2023-07-16 NOTE — ED Notes (Signed)
Pt was given a warm blanket. Lights were turned off per request. Pt is watching tv in the bed. Call light within reach.

## 2023-07-16 NOTE — ED Notes (Signed)
First Nurse Note: Pt to ED via [facility] from [facility] for SI and cuts on her right arm. Pt states that she cut herself last night. Pts vital signs 141/87.   Pt hx/o CVA- Rt sided weakness.

## 2023-07-16 NOTE — ED Triage Notes (Addendum)
Pt from [facility] via EMS for c/o cutting her right arm last night per staff- see also first nurse note. Pt has extensive medical history- is a poor health historian. Pt denies SI/HI, hallucinations, admits to cutting her arm last night but denies a reason at this time. Right forearm with superficial lacerations noted, left forearm with scars noted. Pt unable to walk/mobility issues baseline per paperwork from facility. Pt is AOX4, NAD noted. Pt denies CP, SHOB, dizziness, other complaints at this time. Per note left by the pt's daughter [phone]), pt has a history of depression and is unhappy at the rehab and "makes the comment that she wants to die a lot."

## 2023-07-17 MED ORDER — TRAZODONE HCL 50 MG PO TABS
150.0000 mg | ORAL_TABLET | Freq: Every day | ORAL | Status: DC
  Administered 2023-07-17: 150 mg via ORAL
  Filled 2023-07-17: qty 1

## 2023-07-17 NOTE — Discharge Instructions (Signed)
Return to the ER for worsening symptoms, feelings of hurting yourself or others, or other concerns. 

## 2023-07-17 NOTE — ED Notes (Signed)
[facility] called for transport to [facility] 

## 2023-07-17 NOTE — ED Provider Notes (Signed)
-----------------------------------------   12:09 AM on 07/17/2023 -----------------------------------------   Patient has been cleared by psychiatry to return to her facility.  POA asked that she be given her nighttime medications now as it is unclear when the facility will pick her up.  Strict return cautions given.  Patient verbalizes understanding who agrees with plan of care.   Irean Hong, MD 07/17/23 [phone]

## 2023-07-17 NOTE — ED Notes (Addendum)
This RN spoke with pt nurse, Artist Pais, at [facility] [phone].  This RN discussed pt Dx, current condition, psychiatric evaluation and disposition.  All questions asked were answered.  This RN informed nurse that patient will be transported back to facility via EMS as soon as possible.    This RN spoke with ED charge nurse to notify of plan to discharge.  Charge RN told this RN to call facility back to verify that the discharge plan is clear.  This RN spoke with Artist Pais at [facility] a second time.  Shawna verbalized understanding that the [facility] facility had previously declined the patient's return.  The Psychiatric NP note was read verbatim to [facility].  Shawna verbalized understanding the need for a safety contract and plan involving this patient.  Shawna verbalized understanding that the patient will return back to the facility this morning via EMS.

## 2023-07-17 NOTE — ED Notes (Signed)
Pt is sleeping

## 2023-07-19 ENCOUNTER — Emergency Department
Admission: EM | Admit: 2023-07-19 | Discharge: 2023-07-27 | Disposition: A | Discharge status: Home / Self Care | Payer: Medicaid Other | Attending: Emergency Medicine | Admitting: Emergency Medicine

## 2023-07-19 ENCOUNTER — Encounter: Payer: Self-pay | Admitting: *Deleted

## 2023-07-19 ENCOUNTER — Other Ambulatory Visit: Payer: Self-pay

## 2023-07-19 DIAGNOSIS — Z7289 Other problems related to lifestyle: Secondary | ICD-10-CM

## 2023-07-19 DIAGNOSIS — X781XXA Intentional self-harm by knife, initial encounter: Secondary | ICD-10-CM | POA: Insufficient documentation

## 2023-07-19 DIAGNOSIS — G8191 Hemiplegia, unspecified affecting right dominant side: Secondary | ICD-10-CM | POA: Diagnosis not present

## 2023-07-19 DIAGNOSIS — R45851 Suicidal ideations: Secondary | ICD-10-CM

## 2023-07-19 DIAGNOSIS — S50811A Abrasion of right forearm, initial encounter: Secondary | ICD-10-CM | POA: Insufficient documentation

## 2023-07-19 DIAGNOSIS — I10 Essential (primary) hypertension: Secondary | ICD-10-CM | POA: Insufficient documentation

## 2023-07-19 DIAGNOSIS — S59911A Unspecified injury of right forearm, initial encounter: Secondary | ICD-10-CM | POA: Diagnosis present

## 2023-07-19 DIAGNOSIS — S069XAA Unspecified intracranial injury with loss of consciousness status unknown, initial encounter: Secondary | ICD-10-CM | POA: Diagnosis present

## 2023-07-19 DIAGNOSIS — F332 Major depressive disorder, recurrent severe without psychotic features: Secondary | ICD-10-CM | POA: Diagnosis not present

## 2023-07-19 LAB — URINALYSIS, W/ REFLEX TO CULTURE (INFECTION SUSPECTED)
Bilirubin Urine: NEGATIVE
Glucose, UA: NEGATIVE mg/dL
Hgb urine dipstick: NEGATIVE
Ketones, ur: NEGATIVE mg/dL
Leukocytes,Ua: NEGATIVE
Nitrite: POSITIVE — AB
Protein, ur: NEGATIVE mg/dL
Specific Gravity, Urine: 1.029 (ref 1.005–1.030)
pH: 5 (ref 5.0–8.0)

## 2023-07-19 LAB — COMPREHENSIVE METABOLIC PANEL
ALT: 42 U/L (ref 0–44)
AST: 34 U/L (ref 15–41)
Albumin: 3.8 g/dL (ref 3.5–5.0)
Alkaline Phosphatase: 146 U/L — ABNORMAL HIGH (ref 38–126)
Anion gap: 13 (ref 5–15)
BUN: 21 mg/dL — ABNORMAL HIGH (ref 6–20)
CO2: 23 mmol/L (ref 22–32)
Calcium: 8.9 mg/dL (ref 8.9–10.3)
Chloride: 104 mmol/L (ref 98–111)
Creatinine, Ser: 0.67 mg/dL (ref 0.44–1.00)
GFR, Estimated: >60 mL/min (ref >60)
Glucose, Bld: 111 mg/dL — ABNORMAL HIGH (ref 70–99)
Potassium: 3.7 mmol/L (ref 3.5–5.1)
Sodium: 140 mmol/L (ref 135–145)
Total Bilirubin: 0.5 mg/dL (ref 0.3–1.2)
Total Protein: 7 g/dL (ref 6.5–8.1)

## 2023-07-19 LAB — CBC WITH DIFFERENTIAL/PLATELET
Abs Immature Granulocytes: 0.01 10*3/uL (ref 0.00–0.07)
Basophils Absolute: 0 10*3/uL (ref 0.0–0.1)
Basophils Relative: 1 %
Eosinophils Absolute: 0.2 10*3/uL (ref 0.0–0.5)
Eosinophils Relative: 4 %
HCT: 37.2 % (ref 36.0–46.0)
Hemoglobin: 13.1 g/dL (ref 12.0–15.0)
Immature Granulocytes: 0 %
Lymphocytes Relative: 32 %
Lymphs Abs: 1.6 10*3/uL (ref 0.7–4.0)
MCH: 32 pg (ref 26.0–34.0)
MCHC: 35.2 g/dL (ref 30.0–36.0)
MCV: 91 fL (ref 80.0–100.0)
Monocytes Absolute: 0.3 10*3/uL (ref 0.1–1.0)
Monocytes Relative: 5 %
Neutro Abs: 2.9 10*3/uL (ref 1.7–7.7)
Neutrophils Relative %: 58 %
Platelets: 202 10*3/uL (ref 150–400)
RBC: 4.09 MIL/uL (ref 3.87–5.11)
RDW: 13.4 % (ref 11.5–15.5)
WBC: 4.9 10*3/uL (ref 4.0–10.5)
nRBC: 0 % (ref 0.0–0.2)

## 2023-07-19 LAB — SALICYLATE LEVEL: Salicylate Lvl: <7 mg/dL — ABNORMAL LOW (ref 7.0–30.0)

## 2023-07-19 LAB — ETHANOL: Alcohol, Ethyl (B): <10 mg/dL (ref <10)

## 2023-07-19 LAB — ACETAMINOPHEN LEVEL: Acetaminophen (Tylenol), Serum: <10 ug/mL — ABNORMAL LOW (ref 10–30)

## 2023-07-19 MED ORDER — ALUM & MAG HYDROXIDE-SIMETH 200-200-20 MG/5ML PO SUSP: 15.0000 mL | Freq: Three times a day (TID) | ORAL | Status: DC | PRN

## 2023-07-19 MED ORDER — POLYETHYLENE GLYCOL 3350 17 G PO PACK: 17.0000 g | PACK | Freq: Every day | ORAL | Status: DC | PRN

## 2023-07-19 MED ORDER — SENNA 8.6 MG PO TABS
2.0000 | ORAL_TABLET | Freq: Two times a day (BID) | ORAL | Status: DC
  Administered 2023-07-19 – 2023-07-27 (×11): 17.2 mg via ORAL
  Filled 2023-07-19 (×13): qty 2

## 2023-07-19 MED ORDER — OXYCODONE-ACETAMINOPHEN 5-325 MG PO TABS
1.0000 | ORAL_TABLET | ORAL | Status: DC | PRN
  Administered 2023-07-21 – 2023-07-27 (×20): 1 via ORAL
  Filled 2023-07-19 (×20): qty 1

## 2023-07-19 MED ORDER — ALUM & MAG HYDROXIDE-SIMETH 200-200-20 MG/5ML PO SUSP: 30.0000 mL | Freq: Four times a day (QID) | ORAL | Status: DC | PRN

## 2023-07-19 MED ORDER — ALLOPURINOL 100 MG PO TABS
100.0000 mg | ORAL_TABLET | Freq: Every day | ORAL | Status: DC
  Administered 2023-07-20 – 2023-07-27 (×8): 100 mg via ORAL
  Filled 2023-07-19 (×8): qty 1

## 2023-07-19 MED ORDER — ACETAMINOPHEN 500 MG PO TABS
1000.0000 mg | ORAL_TABLET | Freq: Three times a day (TID) | ORAL | Status: DC
  Administered 2023-07-19 – 2023-07-26 (×12): 1000 mg via ORAL
  Filled 2023-07-19 (×16): qty 2

## 2023-07-19 MED ORDER — CLONAZEPAM 0.5 MG PO TABS
1.5000 mg | ORAL_TABLET | Freq: Two times a day (BID) | ORAL | Status: DC | PRN
  Administered 2023-07-24: 1.5 mg via ORAL
  Filled 2023-07-19 (×2): qty 3

## 2023-07-19 MED ORDER — IBUPROFEN 600 MG PO TABS
600.0000 mg | ORAL_TABLET | Freq: Three times a day (TID) | ORAL | Status: DC | PRN
  Administered 2023-07-21 – 2023-07-22 (×2): 600 mg via ORAL
  Filled 2023-07-19 (×2): qty 1

## 2023-07-19 MED ORDER — LINACLOTIDE 145 MCG PO CAPS
145.0000 ug | ORAL_CAPSULE | Freq: Every day | ORAL | Status: DC
  Administered 2023-07-20 – 2023-07-27 (×8): 145 ug via ORAL
  Filled 2023-07-19 (×8): qty 1

## 2023-07-19 MED ORDER — TRAZODONE HCL 50 MG PO TABS
150.0000 mg | ORAL_TABLET | Freq: Every day | ORAL | Status: DC
  Administered 2023-07-19 – 2023-07-26 (×8): 150 mg via ORAL
  Filled 2023-07-19 (×8): qty 1

## 2023-07-19 MED ORDER — ASPIRIN 81 MG PO TBEC
81.0000 mg | DELAYED_RELEASE_TABLET | Freq: Every day | ORAL | Status: DC
  Administered 2023-07-20 – 2023-07-27 (×8): 81 mg via ORAL
  Filled 2023-07-19 (×8): qty 1

## 2023-07-19 MED ORDER — GABAPENTIN 300 MG PO CAPS
600.0000 mg | ORAL_CAPSULE | Freq: Three times a day (TID) | ORAL | Status: DC
  Administered 2023-07-19 – 2023-07-27 (×23): 600 mg via ORAL
  Filled 2023-07-19 (×23): qty 2

## 2023-07-19 MED ORDER — ONDANSETRON HCL 4 MG PO TABS: 4.0000 mg | ORAL_TABLET | Freq: Three times a day (TID) | ORAL | Status: DC | PRN

## 2023-07-19 MED ORDER — DICYCLOMINE HCL 10 MG PO CAPS
10.0000 mg | ORAL_CAPSULE | Freq: Three times a day (TID) | ORAL | Status: DC
  Administered 2023-07-20 – 2023-07-27 (×28): 10 mg via ORAL
  Filled 2023-07-19 (×29): qty 1

## 2023-07-19 MED ORDER — LACTULOSE 10 GM/15ML PO SOLN
30.0000 g | Freq: Every day | ORAL | Status: DC
  Administered 2023-07-20 – 2023-07-27 (×8): 30 g via ORAL
  Filled 2023-07-19 (×8): qty 60

## 2023-07-19 MED ORDER — DULOXETINE HCL 60 MG PO CPEP
60.0000 mg | ORAL_CAPSULE | Freq: Two times a day (BID) | ORAL | Status: DC
  Administered 2023-07-19 – 2023-07-27 (×16): 60 mg via ORAL
  Filled 2023-07-19 (×16): qty 1

## 2023-07-19 MED ORDER — BUSPIRONE HCL 5 MG PO TABS
5.0000 mg | ORAL_TABLET | Freq: Three times a day (TID) | ORAL | Status: DC
  Administered 2023-07-19 – 2023-07-27 (×23): 5 mg via ORAL
  Filled 2023-07-19 (×23): qty 1

## 2023-07-19 NOTE — ED Notes (Signed)
Pt dressed out by RN Amy, NT Randel Books, and this RN. Pt is x3 assist with a potential to be x4. Transfer method is a wheelchair. New linens placed. New chux pad placed. Hospital provided scrubs donned.

## 2023-07-19 NOTE — ED Notes (Signed)
IVC PENDING  CONSULT ?

## 2023-07-19 NOTE — ED Notes (Signed)
Jeans Nude bra [employer] 2 bracelets 1 silver colored necklace.  2 rings on left hand will not come off.

## 2023-07-19 NOTE — ED Notes (Signed)
Pt a difficult stick.  Lab called

## 2023-07-19 NOTE — ED Notes (Signed)
Pt moved to room 23, dressed out.

## 2023-07-19 NOTE — ED Notes (Signed)
Lab with pt now

## 2023-07-19 NOTE — ED Triage Notes (Signed)
Pt brought in via ems from [facility]   pt was seen in ER yesterday for self cutting.  Pt reportedly is SI and HI.  Hx TBI, depression, CVA and anxiety. Pt is IVC.    Pt admits to cutting right arm yesterday.  Superficial cuts noted.  Pt alert.  Pt in hallway bed.

## 2023-07-19 NOTE — ED Notes (Signed)
Pt denies SI or HI at this time.  Pt unable to complete sentences when asked a question.  Hx TBI.  Pt in hallway bed.

## 2023-07-19 NOTE — ED Notes (Signed)
Pt watching tv.  Pt comfortable in bed.

## 2023-07-19 NOTE — ED Provider Notes (Signed)
[facility] Provider Note    Event Date/Time   First MD Initiated Contact with Patient 07/19/23 1604     (approximate)   History   Chief Complaint: Psychiatric Evaluation   HPI  Gloria Aguilar is a 52 y.o. female with a history of right-sided hemiplegia and aphasia secondary to prior TBI, hypertension who is brought to the ED under involuntary commitment due to suicidal thoughts with a plan.  She felt suicidal yesterday and used a knife to cause superficial abrasions on her right forearm.  Reportedly she was sent to a hospital for evaluation yesterday, cleared and discharged back home.  Today, she felt suicidal again and something of an impulsive episode and had a plan to cut herself.  Denies any prior suicide attempts.  Denies any ingestions.  Reports that now she feels totally back to normal and feels calm and feels like she can go home     Physical Exam   Triage Vital Signs: ED Triage Vitals  Encounter Vitals Group     BP      Systolic BP Percentile      Diastolic BP Percentile      Pulse      Resp      Temp      Temp src      SpO2      Weight      Height      Head Circumference      Peak Flow      Pain Score      Pain Loc      Pain Education      Exclude from Growth Chart     Most recent vital signs: Vitals:   07/19/23 1620  BP: 90/64  Pulse: 83  Resp: 20  Temp: 98 F (36.7 C)  SpO2: 98%    General: Awake, no distress. CV:  Good peripheral perfusion.  Resp:  Normal effort.  Abd:  No distention.  Other:  No wounds other than several small superficial abrasions over the right volar forearm, hemostatic, no laceration.  Normal pulses and distal cap refill, no evidence of vascular injury   ED Results / Procedures / Treatments   Labs (all labs ordered are listed, but only abnormal results are displayed) Labs Reviewed - No data to display   EKG    RADIOLOGY    PROCEDURES:  Procedures   MEDICATIONS ORDERED IN  ED: Medications  ibuprofen (ADVIL) tablet 600 mg (has no administration in time range)  ondansetron (ZOFRAN) tablet 4 mg (has no administration in time range)  alum & mag hydroxide-simeth (MAALOX/MYLANTA) 200-200-20 MG/5ML suspension 30 mL (has no administration in time range)     IMPRESSION / MDM / ASSESSMENT AND PLAN / ED COURSE  I reviewed the triage vital signs and the nursing notes.  Patient's presentation is most consistent with acute presentation with potential threat to life or bodily function.  Patient presents with recurrent SI, self-injurious behavior, impulsiveness.  She is under IVC, will continue pending psychiatry evaluation.  Case discussed with the behavioral medicine team.  The patient has been placed in psychiatric observation due to the need to provide a safe environment for the patient while obtaining psychiatric consultation and evaluation, as well as ongoing medical and medication management to treat the patient's condition.  The patient has been placed under full IVC at this time.  Clinical Course as of 07/19/23 1703  Tue Jul 19, 2023  1659 I received a call from psych  provider overseeing the pt's facility. They report [place] is usually calm. Known by this provider since 03/25/23. Has had rapid change in thoughts recently with frequent reporting of SI to staff and also voicing HI toward her roommate. Recently acquired a hunting knife. She's requiring 1:1 monitoring at her facility. [PS]    Clinical Course User Index [PS] Sharman Cheek, MD     FINAL CLINICAL IMPRESSION(S) / ED DIAGNOSES   Final diagnoses:  Suicidal ideation  Right hemiplegia (HCC)     Rx / DC Orders   ED Discharge Orders     None        Note:  This document was prepared using Dragon voice recognition software and may include unintentional dictation errors.   Sharman Cheek, MD 07/19/23 [phone]

## 2023-07-20 DIAGNOSIS — Z7289 Other problems related to lifestyle: Secondary | ICD-10-CM | POA: Diagnosis not present

## 2023-07-20 DIAGNOSIS — F332 Major depressive disorder, recurrent severe without psychotic features: Secondary | ICD-10-CM

## 2023-07-20 LAB — GASTROINTESTINAL PANEL BY PCR, STOOL (REPLACES STOOL CULTURE)

## 2023-07-20 LAB — C DIFFICILE QUICK SCREEN W PCR REFLEX
C Diff antigen: NEGATIVE
C Diff interpretation: NOT DETECTED
C Diff toxin: NEGATIVE

## 2023-07-20 MED ORDER — LOPERAMIDE HCL 2 MG PO CAPS
4.0000 mg | ORAL_CAPSULE | Freq: Once | ORAL | Status: AC
  Administered 2023-07-20: 4 mg via ORAL
  Filled 2023-07-20: qty 2

## 2023-07-20 MED ORDER — FOSFOMYCIN TROMETHAMINE 3 G PO PACK
3.0000 g | PACK | Freq: Once | ORAL | Status: AC
  Administered 2023-07-20: 3 g via ORAL
  Filled 2023-07-20: qty 3

## 2023-07-20 NOTE — ED Notes (Signed)
Pt takes meds crushed in applesauce.

## 2023-07-20 NOTE — ED Notes (Signed)
Pt was offered safe dinner and drink. Pt was given safe silverware with dinner.

## 2023-07-20 NOTE — ED Notes (Signed)
Pt moved to room 26.  Report off to [employer]

## 2023-07-20 NOTE — ED Provider Notes (Signed)
Emergency Medicine Observation Re-evaluation Note  Gloria Aguilar is a 52 y.o. female, currently under IVC awaiting psychiatric disposition.  Physical Exam  BP 137/77 (BP Location: Left Arm)   Pulse 95   Temp 98.3 F (36.8 C) (Oral)   Resp 15   Ht 5\' 5"  (1.651 m)   Wt 110 kg   LMP 12/01/2019   SpO2 98%   BMI 40.36 kg/m    ED Course / MDM   Patient's lab work shows a negative C. difficile negative GI panel.  Patient has had diarrhea I have ordered loperamide for the patient.  Patient's urinalysis shows no red or white cells.  Is nitrate positive I suspect this is contamination.  Acetaminophen is negative chemistry reassuring white blood cell count and CBC normal.  Plan  Current plan is for psychiatric disposition.   Minna Antis, MD 07/20/23 2242
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
## 2023-07-20 NOTE — ED Provider Notes (Signed)
Emergency Medicine Observation Re-evaluation Note  Gloria Aguilar is a 53 y.o. female, seen on rounds today.  Pt initially presented to the ED for complaints of Psychiatric Evaluation Currently, the patient is awaiting psych dispo.  Physical Exam  BP 111/68 (BP Location: Right Arm)   Pulse 89   Temp 98.1 F (36.7 C) (Oral)   Resp 18   Ht 5\' 5"  (1.651 m)   Wt 110 kg   LMP 12/01/2019   SpO2 92%   BMI 40.36 kg/m  Physical Exam General: resting calmly  ED Course / MDM  EKG:   I have reviewed the labs performed to date as well as medications administered while in observation.    Plan  Current plan is for psych dispo.    Phineas Semen, MD 07/20/23 [phone]

## 2023-07-20 NOTE — ED Notes (Signed)
[facility]C  CONSULT  DONE  PENDING  PLACEMENT 
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
## 2023-07-20 NOTE — BH Assessment (Signed)
Adult MH  Referral information for Psychiatric Hospitalization faxed to:   [facility] ([phone]-or- [phone]),   [facility] ([phone]),   [facility] ([phone] -or- [phone]),   [place] ([phone]---[phone]---[phone]),   [place] ([phone] or [phone])   [place] ([phone] or [phone]),   [facility] ([phone]) 

## 2023-07-20 NOTE — ED Notes (Signed)
Pt received evening snack with fresh ice water in a foam cup. Individual food items opened prior to pt receiving tray. Pt made aware no other food would be handed out unil breakfast tomorrow morning.  

## 2023-07-20 NOTE — ED Notes (Signed)
NP notified that POA would like to be contacted about pt.

## 2023-07-20 NOTE — ED Notes (Signed)
Pt rolled and placed on her left side for comfort.

## 2023-07-20 NOTE — ED Notes (Signed)
Pt had phone call with daughter.

## 2023-07-20 NOTE — ED Notes (Signed)
Gloria Aguilar 336 [phone] pt POA would like to be contacted by the psych team in reference to her admission status.

## 2023-07-20 NOTE — ED Notes (Addendum)
Pt was placed on bedpan and cleaned with new linen. Patient was able to assist in turning to right side. Side rails up for fall precaution and bed locked. Pt voices no concern at this time.

## 2023-07-20 NOTE — ED Notes (Signed)
Pt cleaned up after having bowel movement on bed pan. Barrier cream applied to bottom. Replaced with clean chuks pads and new brief, and clean blankets

## 2023-07-20 NOTE — ED Notes (Signed)
IVC/Pt unable to complete assessment/ Pending Evaluation by Provider

## 2023-07-20 NOTE — ED Notes (Signed)
Visitor at bedside.

## 2023-07-20 NOTE — ED Notes (Signed)
Philis Nettle [place] with [facility] Adult services at bedside.

## 2023-07-21 MED ORDER — URSODIOL 300 MG PO CAPS
300.0000 mg | ORAL_CAPSULE | Freq: Three times a day (TID) | ORAL | Status: DC
  Administered 2023-07-21 – 2023-07-27 (×18): 300 mg via ORAL
  Filled 2023-07-21 (×19): qty 1

## 2023-07-21 NOTE — ED Notes (Signed)
Pt cleaned up and brief changed, new chux pad placed, warm blankets given

## 2023-07-21 NOTE — ED Notes (Signed)
Brief changed, new chuck pads placed, warm blankets given

## 2023-07-21 NOTE — Consult Note (Signed)
  Patient previously recommended for inpatient psychiatry. Awaiting placement.

## 2023-07-21 NOTE — ED Notes (Signed)
IVC/Pending Placement 

## 2023-07-21 NOTE — ED Notes (Signed)
Bed changed. Pt cleaned up

## 2023-07-21 NOTE — ED Provider Notes (Signed)
Emergency Medicine Observation Re-evaluation Note  Gloria Aguilar is a 52 y.o. female, seen on rounds today.  Pt initially presented to the ED for complaints of Psychiatric Evaluation Currently, the patient is resting.  Physical Exam  BP 137/77 (BP Location: Left Arm)   Pulse 95   Temp 98.3 F (36.8 C) (Oral)   Resp 15   Ht 1.651 m (5\' 5" )   Wt 110 kg   LMP 12/01/2019   SpO2 98%   BMI 40.36 kg/m  Physical Exam Gen:  No acute distress Resp:  Breathing easily and comfortably, no accessory muscle usage Neuro:  Moving all four extremities, no gross focal neuro deficits Psych:  Resting currently, calm when awake ED Course / MDM  EKG:   I have reviewed the labs performed to date as well as medications administered while in observation.  Recent changes in the last 24 hours include no significant clinical changes.  Plan  Current plan is for psychiatric placement.    Loleta Rose, MD 07/21/23 [phone]

## 2023-07-21 NOTE — ED Notes (Signed)
IVC PENDING PLACMENT.. 

## 2023-07-22 NOTE — ED Notes (Signed)
ivc/awaiting placement.Marland Kitchen

## 2023-07-22 NOTE — ED Notes (Signed)
Pt placed on bedpan and had small bowel movement at this time, pt cleaned and new brief placed on pt. Pt given call bell and lights turned down for comfort.

## 2023-07-22 NOTE — ED Provider Notes (Signed)
Emergency Medicine Observation Re-evaluation Note  Gloria Aguilar is a 52 y.o. female, seen on rounds today.  Pt initially presented to the ED for complaints of Psychiatric Evaluation Currently, the patient is resting, voices no medical complaints.  Physical Exam  BP 135/80   Pulse 88   Temp 98.1 F (36.7 C) (Oral)   Resp 18   Ht 5\' 5"  (1.651 m)   Wt 110 kg   LMP 12/01/2019   SpO2 96%   BMI 40.36 kg/m  Physical Exam General: Resting in no acute distress Cardiac: No cyanosis Lungs: Equal rise and fall Psych: Not agitated  ED Course / MDM  EKG:   I have reviewed the labs performed to date as well as medications administered while in observation.  Recent changes in the last 24 hours include no events overnight.  Plan  Current plan is for psychiatric disposition.    Irean Hong, MD 07/22/23 [phone]

## 2023-07-22 NOTE — ED Notes (Signed)
Pt placed on bedpan, pt urinated. Pt cleaned, new brief placed under pt. Pt given meal tray and repositioned in bed, call bell with in reach.

## 2023-07-22 NOTE — BH Assessment (Signed)
TTS spoke with patient's cousin Belenda Cruise [phone]). She voiced her concerns about the patient's current Power of Attorney Homero Fellers). She states she spoke with [facility] Trinna Post) and filed an APS report. She asked about visitation and bringing the patient her "communication device." TTS informed her, he would need to speak with someone about it and call her.  Spoke with the patient's nurse ([place] G.) and was advised to speak with supervisor.  TTS spoke with ER Supervisor, Lanora Manis (Marisue Ivan), updated her about what was shared by the cousin and she wanting to visit and bring the communication device. Lanora Manis spoke with the patient, and it was witnessed by TTS. Patient verbalized she knew the cousin and gave permission for her to visit and bring the communication device.  TTS called the cousin and informed her she could visit and bring the communication device. The call was witnessed by the "quad nurse" Revonda Standard.

## 2023-07-22 NOTE — ED Notes (Signed)
IVC pending placement 

## 2023-07-23 MED ORDER — ACETAMINOPHEN 325 MG PO TABS: 650.0000 mg | ORAL_TABLET | Freq: Once | ORAL | Status: DC

## 2023-07-23 MED ORDER — IBUPROFEN 600 MG PO TABS
600.0000 mg | ORAL_TABLET | Freq: Three times a day (TID) | ORAL | Status: DC | PRN
  Administered 2023-07-24 – 2023-07-27 (×2): 600 mg via ORAL
  Filled 2023-07-23 (×3): qty 1

## 2023-07-23 NOTE — ED Notes (Signed)
Patient is IVC pending placement 

## 2023-07-23 NOTE — ED Notes (Signed)
Boyfriend Homero Fellers called to speak to patient.  Pt placed on bedpan at this time.

## 2023-07-23 NOTE — ED Notes (Signed)
Pt's significant other/POA came to visit patient. Pt already had aunt visit around 1200 today. Visitor was upset because he did not know this was a rule and he drove over 40 miles to visit. After communicating with the Charge RN decision was made for a five minute supervised visit. Pt did not know about this rule either and requested for daughter to be here to visit tomorrow and Homero Fellers (SO) visit on Monday. There is conflict between family and POA about visitations. When Aunt was visiting she said that they were trying to get here before Homero Fellers did so that he wouldn't be able to visit. This RN did not share this information with patient or Homero Fellers. During a conversation with Homero Fellers after his visit with pt, Homero Fellers informed this RN that he is who visits patient multiple times per week and that her family didn't visit often prior to pt being admitted to the hospital. This RN witnessed patient express that she would like for Homero Fellers to visit during the week and for family to visit on the weekends. Homero Fellers was very cooperative and pleasant as well as patient's family. Homero Fellers requested for PT to work with patient. This RN asked MD for a PT eval and treat to be ordered. Homero Fellers expressed to this RN that he did not feel like the facility was being honest about patient's suicidal ideation. States that he asked facility where they found the knife and they were unable to tell him. Homero Fellers states that patient does struggle with depression, but has never expressed SI to him. States that patient's daughter was killed in a motorcycle accident and this often saddens her as well as the fact that she is unable to care for herself or easily express her wants and needs. Homero Fellers states that patient would express the need to go to the bathroom to the facility and they would have her go to the restroom in her brief. He said that this was a loss of dignity for the patient. Homero Fellers expressed his desire to speak with a psychiatrist involved with patients  care.

## 2023-07-23 NOTE — ED Notes (Signed)
Pt given bed bath, lotion applied, deodorant applied, and gown changed. No other requests at this time.

## 2023-07-23 NOTE — ED Notes (Signed)
Pt given ice pack

## 2023-07-23 NOTE — ED Provider Notes (Signed)
Emergency Medicine Observation Re-evaluation Note  Bobette Bertke is a 52 y.o. female, seen on rounds today.  Pt initially presented to the ED for complaints of Psychiatric Evaluation Currently, the patient is resting, voices no medical complaints.  Physical Exam  BP 130/83 (BP Location: Left Arm)   Pulse 88   Temp 98.5 F (36.9 C) (Oral)   Resp 17   Ht 5\' 5"  (1.651 m)   Wt 110 kg   LMP 12/01/2019   SpO2 93%   BMI 40.36 kg/m  Physical Exam General: Resting no acute distress Cardiac: No cyanosis Lungs: Equal rise and fall Psych: Not agitated  ED Course / MDM  EKG:   I have reviewed the labs performed to date as well as medications administered while in observation.  Recent changes in the last 24 hours include no events overnight.  Plan  Current plan is for psychiatric disposition.    Irean Hong, MD 07/23/23 [phone]

## 2023-07-23 NOTE — ED Notes (Signed)
Pt given ice cream, sandwich tray, and drink for snack.

## 2023-07-23 NOTE — ED Notes (Signed)
Hospital meal provided, pt tolerated w/o complaints.  Waste discarded appropriately.  

## 2023-07-24 ENCOUNTER — Emergency Department: Payer: Medicaid Other

## 2023-07-24 NOTE — BH Assessment (Signed)
Adult MH  Referral information for Psychiatric Hospitalization faxed to:   [facility] ([phone]-or- [phone]),   [facility] ([phone]),   [facility] ([phone] -or- [phone]),   [place] ([phone]---[phone]---[phone]),   [place] ([phone] or [phone])   [place] ([phone] or [phone]),   [facility] ([phone]) 

## 2023-07-24 NOTE — ED Provider Notes (Signed)
Emergency Medicine Observation Re-evaluation Note  Gloria Aguilar is a 52 y.o. female, seen on rounds today.  Pt initially presented to the ED for complaints of Psychiatric Evaluation Currently, the patient is resting, voices no medical complaints.  Physical Exam  BP 109/74 (BP Location: Left Arm)   Pulse 90   Temp 98.2 F (36.8 C) (Oral)   Resp 18   Ht 5\' 5"  (1.651 m)   Wt 110 kg   LMP 12/01/2019   SpO2 97%   BMI 40.36 kg/m  Physical Exam General: Resting in no acute distress Cardiac: No cyanosis Lungs: Equal rise and fall Psych: Not agitated  ED Course / MDM  EKG:   I have reviewed the labs performed to date as well as medications administered while in observation.  Recent changes in the last 24 hours include no events overnight.  Plan  Current plan is for psychiatric disposition.    Irean Hong, MD 07/24/23 [phone]

## 2023-07-24 NOTE — ED Provider Notes (Signed)
Emergency Medicine Observation Re-evaluation Note  Gloria Aguilar is a 52 y.o. female, seen on rounds today.  Pt initially presented to the ED for complaints of Psychiatric Evaluation  Currently, the patient is resting comfortably.  Physical Exam  BP 109/74 (BP Location: Left Arm)   Pulse 90   Temp 98.2 F (36.8 C) (Oral)   Resp 18   Ht 5\' 5"  (1.651 m)   Wt 110 kg   LMP 12/01/2019   SpO2 97%   BMI 40.36 kg/m  General: No acute distress Cardiac: Well-perfused extremities Lungs: No respiratory distress Psych: Appropriate mood and affect  ED Course / MDM  EKG:   I have reviewed the labs performed to date as well as medications administered while in observation.  Recent changes in the last 24 hours include none.  Plan  Current plan is for placement.   Merwyn Katos, MD 07/24/23 Marlyne Beards

## 2023-07-24 NOTE — ED Notes (Signed)
Pt assisted with bedpan. Clean brief applied

## 2023-07-24 NOTE — ED Notes (Signed)
Pt's diaper and bed pads changed.

## 2023-07-24 NOTE — Evaluation (Signed)
Physical Therapy Evaluation Patient Details Name: Gloria Aguilar MRN: [id number] DOB: 10-06-1971 Today's Date: 07/24/2023  History of Present Illness  Gloria Aguilar is a 52 y.o. female, seen on rounds today.  Pt initially presented to the ED for complaints of Psychiatric Evaluation    Clinical Impression  Pt received in bed eager to get OOB to the chair. Pt demonstrates expressive aphasia but follows commands well. As per nurse, Pt's PLOF is limited to W/C bound mobility in a group home and needed assistance  with transfers. 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 Pt performed Bed mobility with min assist, attempted to stand up but unable. Pt performed lateral scoot transfer to chair with min assist and guarding to prevent sliding. PT referred pt to Mobility specialist and will continue to monitor pt's progress and goals. Pt will benefit from Psych facility , LTC facility after acute.        If plan is discharge home, recommend the following: Two people to help with walking and/or transfers;Two people to help with bathing/dressing/bathroom;Assistance with cooking/housework;Direct supervision/assist for medications management;Direct supervision/assist for financial management;Assist for transportation;Help with stairs or ramp for entrance   Can travel by private vehicle   No    Equipment Recommendations   None recommended at this point  Recommendations for Other Services       Functional Status Assessment Patient has had a recent decline in their functional status and demonstrates the ability to make significant improvements in function in a reasonable and predictable amount of time.     Precautions / Restrictions Precautions Precautions: Fall Restrictions Weight Bearing Restrictions: No      Mobility  Bed Mobility Overal bed mobility: Modified Independent                  Transfers Overall transfer level: Needs  assistance   Transfers: Bed to chair/wheelchair/BSC            Lateral/Scoot Transfers: Min assist General transfer comment: Pt needs set up assist for lateral scoot and guarding to prevent sliding to the floor.    Ambulation/Gait               General Gait Details: unable  Stairs N/A            Wheelchair Mobility     Tilt Bed    Modified Rankin (Stroke Patients Only)       Balance Overall balance assessment: Needs assistance Sitting-balance support: Single extremity supported Sitting balance-Leahy Scale: Good Sitting balance - Comments: once seated pt demosntrates good balance.       Standing balance comment: unable to stand at baseline.                             Pertinent Vitals/Pain Pain Assessment Pain Assessment: No/denies pain    Home Living Family/patient expects to be discharged to:: Other (Comment)                   Additional Comments: Psych Facility/LTC    Prior Function Prior Level of Function : Needs assist             Mobility Comments: Pt is w/c bound and was able to transfer in and out of w/c Independently or with some help. ADLs Comments: assistance for dressing based on prior OT assessment; unknown level of assist since last hospitalization     Extremity/Trunk Assessment  Upper Extremity Assessment Upper Extremity Assessment: Generalized weakness;RUE deficits/detail RUE Deficits / Details: Weak and synergy noted due tochronic TBI    Lower Extremity Assessment Lower Extremity Assessment: Generalized weakness;RLE deficits/detail RLE Deficits / Details: Limited, foot drop  and  weak due to old TBI.       Communication   Communication Communication: Difficulty communicating thoughts/reduced clarity of speech Cueing Techniques: Verbal cues  Cognition Arousal: Alert Behavior During Therapy: WFL for tasks assessed/performed Overall Cognitive Status: Within Functional Limits for tasks assessed  (axpressive aphasia)                                          General Comments      Exercises   Pt performed AROM to LUE/LLE 2 x 10 reps and advised pt to perform the same during the day. Carryover is questionable.    Assessment/Plan    PT Assessment Patient needs continued PT services  PT Problem List Decreased strength;Decreased safety awareness;Obesity       PT Treatment Interventions Wheelchair mobility training;Therapeutic activities;Therapeutic exercise;Balance training    PT Goals (Current goals can be found in the Care Plan section)  Acute Rehab PT Goals Patient Stated Goal: " I want to get better." PT Goal Formulation: With patient Time For Goal Achievement: 08/21/23 Potential to Achieve Goals: Good    Frequency Min 1X/week     Co-evaluation               AM-PAC PT "6 Clicks" Mobility  Outcome Measure Help needed turning from your back to your side while in a flat bed without using bedrails?: A Little Help needed moving from lying on your back to sitting on the side of a flat bed without using bedrails?: A Little Help needed moving to and from a bed to a chair (including a wheelchair)?: A Little Help needed standing up from a chair using your arms (e.g., wheelchair or bedside chair)?: Total Help needed to walk in hospital room?: Total Help needed climbing 3-5 steps with a railing? : Total 6 Click Score: 12    End of Session Equipment Utilized During Treatment: Gait belt Activity Tolerance: Patient tolerated treatment well Patient left: in chair;with call bell/phone within reach Nurse Communication: Mobility status PT Visit Diagnosis: Muscle weakness (generalized) (M62.81)    Time: 4098-1191 PT Time Calculation (min) (ACUTE ONLY): 25 min   Charges:     PT Treatments $Therapeutic Activity: 8-22 mins PT General Charges $$ ACUTE PT VISIT: 1 Visit         Janet Berlin PT DPT 3:11 PM,07/24/23

## 2023-07-24 NOTE — ED Notes (Signed)
IVC/pending psych inpatient placement

## 2023-07-25 NOTE — ED Notes (Signed)
Assisted with bed pan, peri care provided. Brief changed

## 2023-07-25 NOTE — ED Notes (Signed)
Brief, chux, linen changed. Assisted with dinner set up

## 2023-07-25 NOTE — ED Notes (Signed)
Pt assisted on bedpan.

## 2023-07-25 NOTE — ED Notes (Signed)
Trash was removed from patient room

## 2023-07-25 NOTE — ED Notes (Signed)
IVC/  PENDING  PLACEMENT 

## 2023-07-25 NOTE — ED Notes (Signed)
Pt assisted with dentures

## 2023-07-25 NOTE — ED Notes (Signed)
Pt given phone with call to speak to boyfriend

## 2023-07-25 NOTE — ED Provider Notes (Signed)
Emergency Medicine Observation Re-evaluation Note  Gloria Aguilar is a 52 y.o. female, seen on rounds today.  Pt initially presented to the ED for complaints of Psychiatric Evaluation  Currently, the patient is is no acute distress. Denies any concerns at this time.  Physical Exam  Blood pressure 124/71, pulse 96, temperature 98.1 F (36.7 C), temperature source Oral, resp. rate 14, height 5\' 5"  (1.651 m), weight 110 kg, last menstrual period 12/01/2019, SpO2 92%.  Physical Exam: General: No apparent distress Pulm: Normal WOB Neuro: Moving all extremities Psych: Resting comfortably     ED Course / MDM   Clinical Course as of 07/25/23 0700  Tue Jul 19, 2023  1659 I received a call from psych provider overseeing the pt's facility. They report Gloria Aguilar is usually calm. Known by this provider since 03/25/23. Has had rapid change in thoughts recently with frequent reporting of SI to staff and also voicing HI toward her roommate. Recently acquired a hunting knife. She's requiring 1:1 monitoring at her facility. [PS]    Clinical Course User Index [PS] Sharman Cheek, MD    I have reviewed the labs performed to date as well as medications administered while in observation.  Recent changes in the last 24 hours include: No acute events overnight.  Plan   Current plan: Patient awaiting psychiatric disposition. Patient is under full IVC at this time.    Gloria Aguilar, Layla Maw, DO 07/25/23 0700

## 2023-07-25 NOTE — ED Notes (Signed)
Pt received dinner tray.

## 2023-07-25 NOTE — ED Notes (Signed)
Pt repeatedly c/o left leg pain

## 2023-07-25 NOTE — ED Notes (Signed)
Pt repositioned in bed as requested.

## 2023-07-25 NOTE — ED Notes (Signed)
Ivc /pending placement / IVC papers need to be renewed by 07/26/2023 by 02:20 pm

## 2023-07-25 NOTE — ED Notes (Signed)
Pt received snack and water. 

## 2023-07-25 NOTE — ED Notes (Signed)
Assisted with lunch set up

## 2023-07-25 NOTE — ED Notes (Signed)
Pillow provided under left knee as requested. Lights dimmed as requested.

## 2023-07-25 NOTE — ED Notes (Signed)
Pt given phone and number dialed to speak to sister Misty Stanley

## 2023-07-25 NOTE — ED Notes (Signed)
This EDT and EDT Kelli. Changed pts soiled linen, and provided pt wit peri care. Pt sheets were changed and gown. Pt is currently sitting in bed watching tv. Pt has no other needs at this moment.

## 2023-07-25 NOTE — ED Notes (Signed)
Pt assisted with breakfast tray set up

## 2023-07-26 NOTE — BH Assessment (Signed)
Referral information for Psychiatric Hospitalization faxed to:    [facility] ([phone]- [phone]),    [address] [phone]),    [facility] [phone]- [phone]),    [facility] [phone]),    [facility] [phone] or [phone])    [facility] [phone] or [phone]),

## 2023-07-26 NOTE — ED Notes (Signed)
Pt refused a bed bath.

## 2023-07-26 NOTE — BH Assessment (Signed)
Writer received call from patient's cousin, Eldridge Scot [phone]. She states she is in the process of getting legal guardianship for patient. She says Homero Fellers has forbidden family members to see or talk to patient. Writer explained that Homero Fellers has not made any negative statements regarding patient's family members nor has he stated that family members are not allowed to talk to the patient. Writer explained that patient is psych cleared and will be returned back to living facility if they accept her to come back. Writer stated she will call Baxter Hire with status update. Belenda Cruise verbalized understanding.

## 2023-07-26 NOTE — ED Notes (Signed)
IVC PAPERS  RESCINDED  PER  BENNETT  NP

## 2023-07-26 NOTE — ED Notes (Signed)
Pt assisted onto bedpan. Pt had medium loose BM.

## 2023-07-26 NOTE — ED Notes (Signed)
Pt assisted to void with bedpan use by this RN and EDT Tracey. Pt cleaned and new brief applied. Pt repositioned, and states she is ready for nighttime meds.

## 2023-07-26 NOTE — ED Notes (Signed)
Pt assisted with bed pan and linens changed

## 2023-07-26 NOTE — ED Provider Notes (Signed)
Emergency Medicine Observation Re-evaluation Note  Gloria Aguilar is a 52 y.o. female, seen on rounds today.  Pt initially presented to the ED for complaints of Psychiatric Evaluation Currently, the patient is calm, in NAD.  Physical Exam  BP 124/89 (BP Location: Left Arm)   Pulse 94   Temp 98 F (36.7 C) (Oral)   Resp 18   Ht 5\' 5"  (1.651 m)   Wt 110 kg   LMP 12/01/2019   SpO2 95%   BMI 40.36 kg/m    ED Course / MDM  EKG:   I have reviewed the labs performed to date as well as medications administered while in observation.  Recent changes in the last 24 hours include none.  Plan  Current plan is for IVC discontinued, awaiting SW/TOC dispo.    Shaune Pollack, MD 07/26/23 1139

## 2023-07-26 NOTE — ED Notes (Signed)
Pt provided with lunch tray and tea. Pt sitting up in bed eating lunch.

## 2023-07-26 NOTE — ED Notes (Signed)
Pt given PM snack at this time.  

## 2023-07-26 NOTE — ED Notes (Signed)
VOL  TOC  PLACEMENT 

## 2023-07-26 NOTE — ED Notes (Signed)
Gloria Aguilar updated, chatted with pt on the phone.

## 2023-07-26 NOTE — ED Notes (Signed)
This Clinical research associate along with EDT faith, assisted patient in getting cleaned after using the bedpan. Clean chuck placed and warm blanket provided. Call bell within reach, no further needs per patient.
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
## 2023-07-26 NOTE — ED Notes (Signed)
Pt had supervised visit by Susann Givens. Susann Givens updated per plan of care.
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
## 2023-07-26 NOTE — BH Assessment (Signed)
Psych Team received call from Ivin Booty, Clinical Admin at [facility]. Psych informed him that patient is psych cleared and will need to be picked up. Psych Team answered questions regarding patient's current status. Patient denies SI/HI/AH/VH. Patient is stable on medications. Sharia Reeve states he will send staff members out to ED to assess patient on tomm 07/27/23. Psych Team informed Sharia Reeve that patient will be placed under care of TOC. Josh verbalized understanding.   Per Christal, NP, patient does not meet criteria for inpatient psychiatric admission.

## 2023-07-26 NOTE — ED Notes (Signed)
PT  NOW  VOL  PENDING  PLACEMENT

## 2023-07-26 NOTE — Progress Notes (Signed)
Physical Therapy Treatment Patient Details Name: Gloria Aguilar MRN: [id number] DOB: [birth year] Today's Date: 07/26/2023   History of Present Illness Gloria Aguilar is a 52 y.o. female, seen on rounds today.  Pt initially presented to the ED for complaints of Psychiatric Evaluation    PT Comments  Pt was pleasant and motivated to participate during the session and put forth good effort throughout. Pt continues to be Mod I for bed mobility, with increased time for LUE to assist with RLE, and Min A for lateral scoot transfer from bed to chair, guarding at bilateral knees to prevent sliding. Once seated pt willing to perform seated chair push-ups/partial STS attempts, with cues to push through all extremities as much as possible. Pt given RUE elbow support and had bilateral knees blocked. Pt educated on overall benefits of exercises to maintain functional muscle activity. Pt will benefit from continued PT services upon discharge to safely address deficits listed in patient problem list for decreased caregiver assistance and eventual return to PLOF.    If plan is discharge home, recommend the following: Two people to help with walking and/or transfers;Two people to help with bathing/dressing/bathroom;Assistance with cooking/housework;Direct supervision/assist for medications management;Direct supervision/assist for financial management;Assist for transportation;Help with stairs or ramp for entrance   Can travel by private vehicle     No  Equipment Recommendations  Other (comment) (TBD at next venue of care)    Recommendations for Other Services       Precautions / Restrictions Precautions Precautions: Fall Restrictions Weight Bearing Restrictions: No     Mobility  Bed Mobility Overal bed mobility: Modified Independent             General bed mobility comments: increased time for LUE to assist RLE    Transfers     Transfers: Bed to chair/wheelchair/BSC            Lateral/Scoot  Transfers: Min assist General transfer comment: Pt needs set up assist for lateral scoot and guarding to prevent sliding to the floor, needing additional R LE management during transition    Ambulation/Gait               General Gait Details: unable   Stairs             Wheelchair Mobility     Tilt Bed    Modified Rankin (Stroke Patients Only)       Balance Overall balance assessment: Needs assistance Sitting-balance support: Single extremity supported Sitting balance-Leahy Scale: Good Sitting balance - Comments: once upright pt good with balance       Standing balance comment: unable to stand at baseline.                            Cognition Arousal: Alert Behavior During Therapy: WFL for tasks assessed/performed Overall Cognitive Status: Within Functional Limits for tasks assessed                                          Exercises Other Exercises Other Exercises: Pt able to perform 2x 5 STS/chair push-up attempts, with RUE elbow supported, as well as bilat knees blocked. cues given for pt to push through all extremeties as much as pt can    General Comments        Pertinent Vitals/Pain Pain Assessment Pain Assessment: No/denies pain    Home  Living                          Prior Function            PT Goals (current goals can now be found in the care plan section) Progress towards PT goals: Progressing toward goals    Frequency    Min 1X/week      PT Plan      Co-evaluation              AM-PAC PT "6 Clicks" Mobility   Outcome Measure  Help needed turning from your back to your side while in a flat bed without using bedrails?: A Little Help needed moving from lying on your back to sitting on the side of a flat bed without using bedrails?: A Little Help needed moving to and from a bed to a chair (including a wheelchair)?: A Little Help needed standing up from a chair using your arms  (e.g., wheelchair or bedside chair)?: Total Help needed to walk in hospital room?: Total Help needed climbing 3-5 steps with a railing? : Total 6 Click Score: 12    End of Session Equipment Utilized During Treatment: Gait belt Activity Tolerance: Patient tolerated treatment well Patient left: in chair;with call bell/phone within reach Nurse Communication: Mobility status PT Visit Diagnosis: Muscle weakness (generalized) (M62.81)     Time: 4782-9562 PT Time Calculation (min) (ACUTE ONLY): 18 min  Charges:                            Cecile Sheerer, SPT 07/26/23, 4:44 PM

## 2023-07-26 NOTE — ED Notes (Signed)
Gloria Aguilar updated per pt status. Gloria Aguilar concerned for fair evaluation from facility. Requested third party or himself be present during evaluation.

## 2023-07-26 NOTE — ED Notes (Signed)
Pt hit call bell and pointed to her hair. When asked what she needed she just pointed to her hair and shook her head no. This EDT asked pt if she wanted her hair washed she smiled and shook her head head yes. This EDT used no rinse shampoo and retrieved pt's pink comb and pink brush from her locked belongings in the [place]. This EDT detangled pt's hair and repositioned pt in bed. Pt is currently laying in bed watching TV. No needs to be met at this moment.

## 2023-07-26 NOTE — ED Notes (Signed)
Pt moved back into bed via sliding board and 4 staff. Pt tolerated well. Assisted onto bedpan.

## 2023-07-26 NOTE — ED Notes (Signed)
IVC/Still Pending Placement/Renewal Of Legal papers needed by 2pm

## 2023-07-26 NOTE — ED Notes (Signed)
SNF representatives came to do eval on pt. This RN present. Representatives stated they will be in touch after talking with SNF director.

## 2023-07-27 NOTE — ED Notes (Addendum)
Pt voicing need to void, pt placed on bedpan. After use bedpan removed, pt cleaned and chucks/brief changed. New blankets and gown given. Pt voicing having headache, asked pt if she wanted some tylenol, she said yes.

## 2023-07-27 NOTE — ED Notes (Signed)
Pt able to have BM, pt cleaned and repositioned in bed. Pt asking for all lights turned off due to headache.

## 2023-07-27 NOTE — ED Provider Notes (Signed)
Emergency Medicine Observation Re-evaluation Note  Gloria Aguilar is a 52 y.o. female, seen on rounds today.  Pt initially presented to the ED for complaints of Psychiatric Evaluation Currently, the patient is resting, voices no medical complaints.  Physical Exam  BP 119/70   Pulse 95   Temp 97.8 F (36.6 C) (Oral)   Resp 20   Ht 5\' 5"  (1.651 m)   Wt 110 kg   LMP 12/01/2019   SpO2 94%   BMI 40.36 kg/m  Physical Exam General: Resting in no acute distress Cardiac: No cyanosis Lungs: Equal rise and fall Psych: Not agitated  ED Course / MDM  EKG:   I have reviewed the labs performed to date as well as medications administered while in observation.  Recent changes in the last 24 hours include no events overnight.  Plan  Current plan is for psychiatric disposition.    Irean Hong, MD 07/27/23 [phone]

## 2023-07-27 NOTE — TOC Progression Note (Addendum)
Transition of Care [facility]) - Progression Note    Patient Details  Name: Gloria Aguilar MRN: [id number] Date of Birth: 07/05/1971  Transition of Care [facility]) CM/SW Contact  Margarito Liner, LCSW Phone Number: 07/27/2023, 9:05 AM  Clinical Narrative: SNF social worker will print off yesterday's psychiatric NP note and provide to her administrator for review.   11:30 am: SNF can accept patient back today. CSW sent secure chat to MD and RN to notify.  Expected Discharge Plan: Skilled Nursing Facility Barriers to Discharge: Continued Medical Work up  Expected Discharge Plan and Services     Post Acute Care Choice: Resumption of Svcs/PTA Provider Living arrangements for the past 2 months: Skilled Nursing Facility                                       Social Determinants of Health (SDOH) Interventions SDOH Screenings   Food Insecurity: No Food Insecurity (02/01/2023)  Housing: Low Risk  (02/01/2023)  Transportation Needs: No Transportation Needs (02/01/2023)  Utilities: Not At Risk (02/01/2023)  Financial Resource Strain: Low Risk  (12/13/2022)   Received from [facility], [facility]  Physical Activity: Inactive (09/28/2021)   Received from [facility], [facility]  Social Connections: Socially Isolated (09/28/2021)   Received from [facility], [facility]  Stress: No Stress Concern Present (09/28/2021)   Received from [facility], [facility]  Tobacco Use: Medium Risk (07/19/2023)    Readmission Risk Interventions     No data to display

## 2023-07-27 NOTE — ED Notes (Signed)
Pt stating need to void again, also saying her head is still hurting. Pt denies any pain anywhere else and denies any other symptoms. Pt placed on bedpan.

## 2023-07-27 NOTE — ED Notes (Signed)
Called C com for transport back to [facility]  [phone]
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
## 2023-07-27 NOTE — ED Notes (Signed)
Update given to POA, Juliann Pares. Pt going back to [facility], Homero Fellers stated he is willing to pick pt up and take back to SNF if that works with everyone.

## 2023-07-27 NOTE — ED Notes (Signed)
Gloria Aguilar reports pt needs to come EMS, [employer] informed.

## 2023-07-27 NOTE — ED Notes (Signed)
VOL/Pending placement 

## 2023-07-27 NOTE — TOC Transition Note (Signed)
Transition of Care [facility]) - CM/SW Discharge Note   Patient Details  Name: Gloria Aguilar MRN: [id number] Date of Birth: 17-Oct-1971  Transition of Care [facility]) CM/SW Contact:  Margarito Liner, LCSW Phone Number: 07/27/2023, 12:58 PM   Clinical Narrative:   Patient will discharge back to [facility] today. RN already called report. EMS has already picked patient up. No further concerns. CSW signing off.  Final next level of care: Skilled Nursing Facility Barriers to Discharge: Barriers Resolved   Patient Goals and CMS Choice      Discharge Placement                Patient chooses bed at: Other - please specify in the comment section below: ([facility]) Patient to be transferred to facility by: EMS Name of family member notified: RN notified HCPOA, Clyda Greener Patient and family notified of of transfer: 07/27/23  Discharge Plan and Services Additional resources added to the After Visit Summary for       Post Acute Care Choice: Resumption of Svcs/PTA Provider                               Social Determinants of Health (SDOH) Interventions SDOH Screenings   Food Insecurity: No Food Insecurity (02/01/2023)  Housing: Low Risk  (02/01/2023)  Transportation Needs: No Transportation Needs (02/01/2023)  Utilities: Not At Risk (02/01/2023)  Financial Resource Strain: Low Risk  (12/13/2022)   Received from [facility], [facility]  Physical Activity: Inactive (09/28/2021)   Received from [facility], [facility]  Social Connections: Socially Isolated (09/28/2021)   Received from [facility], [facility]  Stress: No Stress Concern Present (09/28/2021)   Received from [facility], [facility]  Tobacco Use: Medium Risk (07/19/2023)     Readmission Risk Interventions     No data to display

## 2023-07-27 NOTE — ED Notes (Signed)
Pt updated with plan to return to [facility]. Pt in agreement with plan

## 2023-09-09 ENCOUNTER — Emergency Department
Admission: EM | Admit: 2023-09-09 | Discharge: 2023-09-09 | Disposition: A | Discharge status: Home / Self Care | Payer: Medicaid Other | Attending: Emergency Medicine | Admitting: Emergency Medicine

## 2023-09-09 ENCOUNTER — Other Ambulatory Visit: Payer: Self-pay

## 2023-09-09 DIAGNOSIS — R404 Transient alteration of awareness: Secondary | ICD-10-CM | POA: Diagnosis not present

## 2023-09-09 DIAGNOSIS — R4182 Altered mental status, unspecified: Secondary | ICD-10-CM | POA: Diagnosis present

## 2023-09-09 LAB — CBC
HCT: 37.2 % (ref 36.0–46.0)
Hemoglobin: 13.3 g/dL (ref 12.0–15.0)
MCH: 31.7 pg (ref 26.0–34.0)
MCHC: 35.8 g/dL (ref 30.0–36.0)
MCV: 88.8 fL (ref 80.0–100.0)
Platelets: 235 10*3/uL (ref 150–400)
RBC: 4.19 MIL/uL (ref 3.87–5.11)
RDW: 13.7 % (ref 11.5–15.5)
WBC: 8.5 10*3/uL (ref 4.0–10.5)
nRBC: 0 % (ref 0.0–0.2)

## 2023-09-09 LAB — COMPREHENSIVE METABOLIC PANEL
ALT: 43 U/L (ref 0–44)
AST: 28 U/L (ref 15–41)
Albumin: 3.7 g/dL (ref 3.5–5.0)
Alkaline Phosphatase: 146 U/L — ABNORMAL HIGH (ref 38–126)
Anion gap: 8 (ref 5–15)
BUN: 23 mg/dL — ABNORMAL HIGH (ref 6–20)
CO2: 27 mmol/L (ref 22–32)
Calcium: 8.9 mg/dL (ref 8.9–10.3)
Chloride: 109 mmol/L (ref 98–111)
Creatinine, Ser: 0.56 mg/dL (ref 0.44–1.00)
GFR, Estimated: >60 mL/min (ref >60)
Glucose, Bld: 98 mg/dL (ref 70–99)
Potassium: 3.7 mmol/L (ref 3.5–5.1)
Sodium: 144 mmol/L (ref 135–145)
Total Bilirubin: 0.4 mg/dL (ref 0.3–1.2)
Total Protein: 7.3 g/dL (ref 6.5–8.1)
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
## 2023-09-09 NOTE — ED Notes (Signed)
Report called to [employer] at [employer]

## 2023-09-09 NOTE — ED Provider Notes (Signed)
[facility] Provider Note   Event Date/Time   First MD Initiated Contact with Patient 09/09/23 1108     (approximate) History  Altered Mental Status  HPI Gloria Aguilar is a 52 y.o. female with a stated past medical history of TBI who presents via EMS for [facility] with complaints of patient being more lethargic upon EMS arrival however patient now appears to be at baseline.  Patient is currently being treated for upper respiratory infection and taking cough medicines.  Patient also states that she had an x-ray a couple days ago and was told there was nothing there. ROS: Patient currently denies any vision changes, tinnitus, difficulty speaking, facial droop, sore throat, chest pain, shortness of breath, abdominal pain, nausea/vomiting/diarrhea, dysuria, or weakness/numbness/paresthesias in any extremity   Physical Exam  Triage Vital Signs: ED Triage Vitals  Encounter Vitals Group     BP 09/09/23 1013 99/81     Systolic BP Percentile --      Diastolic BP Percentile --      Pulse Rate 09/09/23 1013 98     Resp 09/09/23 1013 18     Temp 09/09/23 1013 97.7 F (36.5 C)     Temp src --      SpO2 09/09/23 1013 96 %     Weight --      Height --      Head Circumference --      Peak Flow --      Pain Score 09/09/23 1003 0     Pain Loc --      Pain Education --      Exclude from Growth Chart --    Most recent vital signs: Vitals:   09/09/23 1013 09/09/23 1222  BP: 99/81 (!) 147/87  Pulse: 98 76  Resp: 18 17  Temp: 97.7 F (36.5 C)   SpO2: 96% 97%   General: Awake, oriented x4. CV:  Good peripheral perfusion.  Resp:  Normal effort.  Abd:  No distention.  Other:  Middle-aged obese Caucasian female resting comfortably in no acute distress ED Results / Procedures / Treatments  Labs (all labs ordered are listed, but only abnormal results are displayed) Labs Reviewed  COMPREHENSIVE METABOLIC PANEL - Abnormal; Notable for the following  components:      Result Value   BUN 23 (*)    Alkaline Phosphatase 146 (*)    All other components within normal limits  CBC  CBG MONITORING, ED   PROCEDURES: Critical Care performed: No .1-3 Lead EKG Interpretation  Performed by: Merwyn Katos, MD Authorized by: Merwyn Katos, MD     Interpretation: normal     ECG rate:  71   ECG rate assessment: normal     Rhythm: sinus rhythm     Ectopy: none     Conduction: normal    MEDICATIONS ORDERED IN ED: Medications - No data to display IMPRESSION / MDM / ASSESSMENT AND PLAN / ED COURSE  I reviewed the triage vital signs and the nursing notes.                             The patient is on the cardiac monitor to evaluate for evidence of arrhythmia and/or significant heart rate changes. Patient's presentation is most consistent with acute presentation with potential threat to life or bodily function. The patient suffered an episode of altered mental status, but there is no overt concern for  a dangerous emergent cause such as, but not limited to, CNS infection, severe Toxidrome, severe metabolic derangement, or stroke.  Given History, Physical, and Workup the cause is not immediately apparent however may be related to cold/flu medications.  Patient instructed on continued antitussive use  Disposition: Discharge. At the time of discharge, the patient is back to baseline mental status.   FINAL CLINICAL IMPRESSION(S) / ED DIAGNOSES   Final diagnoses:  Transient alteration of awareness   Rx / DC Orders   ED Discharge Orders     None      Note:  This document was prepared using Dragon voice recognition software and may include unintentional dictation errors.   Merwyn Katos, MD 09/09/23 1332

## 2023-09-09 NOTE — ED Notes (Signed)
Gloria Aguilar called from [facility], she stated the pt has trouble speaking and if the staff has trouble communicating with Gloria Aguilar they should contact her at [phone]

## 2024-01-10 ENCOUNTER — Emergency Department: Payer: Medicaid Other

## 2024-01-10 ENCOUNTER — Other Ambulatory Visit: Payer: Self-pay

## 2024-01-10 ENCOUNTER — Inpatient Hospital Stay
Admission: EM | Admit: 2024-01-10 | Discharge: 2024-01-12 | DRG: 190 | Disposition: A | Discharge status: Skilled Nursing Facility | Payer: Medicaid Other | Attending: Internal Medicine | Admitting: Internal Medicine

## 2024-01-10 DIAGNOSIS — Z6830 Body mass index (BMI) 30.0-30.9, adult: Secondary | ICD-10-CM

## 2024-01-10 DIAGNOSIS — M1990 Unspecified osteoarthritis, unspecified site: Secondary | ICD-10-CM | POA: Diagnosis present

## 2024-01-10 DIAGNOSIS — R7989 Other specified abnormal findings of blood chemistry: Secondary | ICD-10-CM | POA: Diagnosis not present

## 2024-01-10 DIAGNOSIS — M79604 Pain in right leg: Principal | ICD-10-CM

## 2024-01-10 DIAGNOSIS — I10 Essential (primary) hypertension: Secondary | ICD-10-CM | POA: Diagnosis not present

## 2024-01-10 DIAGNOSIS — J441 Chronic obstructive pulmonary disease with (acute) exacerbation: Secondary | ICD-10-CM | POA: Diagnosis not present

## 2024-01-10 DIAGNOSIS — S069X9S Unspecified intracranial injury with loss of consciousness of unspecified duration, sequela: Secondary | ICD-10-CM

## 2024-01-10 DIAGNOSIS — Z87891 Personal history of nicotine dependence: Secondary | ICD-10-CM

## 2024-01-10 DIAGNOSIS — Z888 Allergy status to other drugs, medicaments and biological substances status: Secondary | ICD-10-CM

## 2024-01-10 DIAGNOSIS — Z8673 Personal history of transient ischemic attack (TIA), and cerebral infarction without residual deficits: Secondary | ICD-10-CM

## 2024-01-10 DIAGNOSIS — Z87820 Personal history of traumatic brain injury: Secondary | ICD-10-CM

## 2024-01-10 DIAGNOSIS — I69351 Hemiplegia and hemiparesis following cerebral infarction affecting right dominant side: Secondary | ICD-10-CM

## 2024-01-10 DIAGNOSIS — F418 Other specified anxiety disorders: Secondary | ICD-10-CM | POA: Diagnosis present

## 2024-01-10 DIAGNOSIS — Z1152 Encounter for screening for COVID-19: Secondary | ICD-10-CM

## 2024-01-10 DIAGNOSIS — I69320 Aphasia following cerebral infarction: Secondary | ICD-10-CM

## 2024-01-10 DIAGNOSIS — Z79899 Other long term (current) drug therapy: Secondary | ICD-10-CM

## 2024-01-10 DIAGNOSIS — Z7982 Long term (current) use of aspirin: Secondary | ICD-10-CM

## 2024-01-10 DIAGNOSIS — J9601 Acute respiratory failure with hypoxia: Secondary | ICD-10-CM | POA: Diagnosis present

## 2024-01-10 DIAGNOSIS — E669 Obesity, unspecified: Secondary | ICD-10-CM | POA: Diagnosis present

## 2024-01-10 DIAGNOSIS — K589 Irritable bowel syndrome without diarrhea: Secondary | ICD-10-CM | POA: Diagnosis present

## 2024-01-10 DIAGNOSIS — S069XAA Unspecified intracranial injury with loss of consciousness status unknown, initial encounter: Secondary | ICD-10-CM | POA: Diagnosis present

## 2024-01-10 DIAGNOSIS — Z881 Allergy status to other antibiotic agents status: Secondary | ICD-10-CM

## 2024-01-10 LAB — COMPREHENSIVE METABOLIC PANEL
ALT: 24 U/L (ref 0–44)
AST: 23 U/L (ref 15–41)
Albumin: 3.5 g/dL (ref 3.5–5.0)
Alkaline Phosphatase: 108 U/L (ref 38–126)
Anion gap: 10 (ref 5–15)
BUN: 17 mg/dL (ref 6–20)
CO2: 23 mmol/L (ref 22–32)
Calcium: 8.8 mg/dL — ABNORMAL LOW (ref 8.9–10.3)
Chloride: 105 mmol/L (ref 98–111)
Creatinine, Ser: 0.61 mg/dL (ref 0.44–1.00)
GFR, Estimated: >60 mL/min (ref >60)
Glucose, Bld: 96 mg/dL (ref 70–99)
Potassium: 3.4 mmol/L — ABNORMAL LOW (ref 3.5–5.1)
Sodium: 138 mmol/L (ref 135–145)
Total Bilirubin: 0.6 mg/dL (ref 0.0–1.2)
Total Protein: 6.9 g/dL (ref 6.5–8.1)

## 2024-01-10 LAB — RESPIRATORY PANEL BY PCR

## 2024-01-10 LAB — BRAIN NATRIURETIC PEPTIDE: B Natriuretic Peptide: 290.5 pg/mL — ABNORMAL HIGH (ref 0.0–100.0)

## 2024-01-10 LAB — CBC WITH DIFFERENTIAL/PLATELET
Abs Immature Granulocytes: 0.01 10*3/uL (ref 0.00–0.07)
Basophils Absolute: 0 10*3/uL (ref 0.0–0.1)
Basophils Relative: 1 %
Eosinophils Absolute: 0.2 10*3/uL (ref 0.0–0.5)
Eosinophils Relative: 6 %
HCT: 39.3 % (ref 36.0–46.0)
Hemoglobin: 13.5 g/dL (ref 12.0–15.0)
Immature Granulocytes: 0 %
Lymphocytes Relative: 29 %
Lymphs Abs: 1.2 10*3/uL (ref 0.7–4.0)
MCH: 31 pg (ref 26.0–34.0)
MCHC: 34.4 g/dL (ref 30.0–36.0)
MCV: 90.1 fL (ref 80.0–100.0)
Monocytes Absolute: 0.3 10*3/uL (ref 0.1–1.0)
Monocytes Relative: 7 %
Neutro Abs: 2.4 10*3/uL (ref 1.7–7.7)
Neutrophils Relative %: 57 %
Platelets: 181 10*3/uL (ref 150–400)
RBC: 4.36 MIL/uL (ref 3.87–5.11)
RDW: 12.4 % (ref 11.5–15.5)
WBC: 4.1 10*3/uL (ref 4.0–10.5)
nRBC: 0 % (ref 0.0–0.2)

## 2024-01-10 LAB — RESP PANEL BY RT-PCR (RSV, FLU A&B, COVID)  RVPGX2
Influenza A by PCR: NEGATIVE
Influenza B by PCR: NEGATIVE
Resp Syncytial Virus by PCR: NEGATIVE
SARS Coronavirus 2 by RT PCR: NEGATIVE

## 2024-01-10 LAB — BLOOD GAS, VENOUS
Acid-Base Excess: 3.6 mmol/L — ABNORMAL HIGH (ref 0.0–2.0)
Bicarbonate: 31.7 mmol/L — ABNORMAL HIGH (ref 20.0–28.0)
O2 Saturation: 75.7 %
Patient temperature: 37
pCO2, Ven: 63 mm[Hg] — ABNORMAL HIGH (ref 44–60)
pH, Ven: 7.31 (ref 7.25–7.43)
pO2, Ven: 45 mm[Hg] (ref 32–45)

## 2024-01-10 LAB — TROPONIN I (HIGH SENSITIVITY)
Troponin I (High Sensitivity): <2 ng/L (ref <18)
Troponin I (High Sensitivity): <2 ng/L (ref <18)

## 2024-01-10 MED ORDER — BUSPIRONE HCL 10 MG PO TABS
5.0000 mg | ORAL_TABLET | Freq: Two times a day (BID) | ORAL | Status: DC
  Administered 2024-01-10 – 2024-01-12 (×4): 5 mg via ORAL
  Filled 2024-01-10 (×4): qty 1

## 2024-01-10 MED ORDER — SODIUM CHLORIDE 0.9% FLUSH
3.0000 mL | Freq: Two times a day (BID) | INTRAVENOUS | Status: DC
  Administered 2024-01-10 – 2024-01-12 (×5): 3 mL via INTRAVENOUS

## 2024-01-10 MED ORDER — IOHEXOL 350 MG/ML SOLN
75.0000 mL | Freq: Once | INTRAVENOUS | Status: AC | PRN
  Administered 2024-01-10: 75 mL via INTRAVENOUS

## 2024-01-10 MED ORDER — GABAPENTIN 300 MG PO CAPS
600.0000 mg | ORAL_CAPSULE | Freq: Three times a day (TID) | ORAL | Status: DC
Start: 2024-01-10 — End: 2024-01-12
  Administered 2024-01-10 – 2024-01-12 (×5): 600 mg via ORAL
  Filled 2024-01-10 (×5): qty 2

## 2024-01-10 MED ORDER — ENOXAPARIN SODIUM 40 MG/0.4ML IJ SOSY
40.0000 mg | PREFILLED_SYRINGE | INTRAMUSCULAR | Status: DC
  Administered 2024-01-10 – 2024-01-11 (×2): 40 mg via SUBCUTANEOUS
  Filled 2024-01-10 (×2): qty 0.4

## 2024-01-10 MED ORDER — TRAZODONE HCL 50 MG PO TABS
25.0000 mg | ORAL_TABLET | Freq: Every day | ORAL | Status: DC
  Administered 2024-01-10 – 2024-01-11 (×2): 25 mg via ORAL
  Filled 2024-01-10 (×2): qty 1

## 2024-01-10 MED ORDER — ACETAMINOPHEN 500 MG PO TABS
1000.0000 mg | ORAL_TABLET | Freq: Three times a day (TID) | ORAL | Status: DC
  Administered 2024-01-10 – 2024-01-12 (×4): 1000 mg via ORAL
  Filled 2024-01-10 (×4): qty 2

## 2024-01-10 MED ORDER — PREDNISONE 20 MG PO TABS
40.0000 mg | ORAL_TABLET | Freq: Every day | ORAL | Status: DC
  Administered 2024-01-11 – 2024-01-12 (×2): 40 mg via ORAL
  Filled 2024-01-10 (×2): qty 2

## 2024-01-10 MED ORDER — BUSPIRONE HCL 10 MG PO TABS
10.0000 mg | ORAL_TABLET | Freq: Every day | ORAL | Status: DC
  Administered 2024-01-11 – 2024-01-12 (×2): 10 mg via ORAL
  Filled 2024-01-10 (×2): qty 1

## 2024-01-10 MED ORDER — DICYCLOMINE HCL 10 MG PO CAPS
10.0000 mg | ORAL_CAPSULE | Freq: Three times a day (TID) | ORAL | Status: DC
  Administered 2024-01-10 – 2024-01-12 (×7): 10 mg via ORAL
  Filled 2024-01-10 (×7): qty 1

## 2024-01-10 MED ORDER — DULOXETINE HCL 30 MG PO CPEP
60.0000 mg | ORAL_CAPSULE | Freq: Two times a day (BID) | ORAL | Status: DC
  Administered 2024-01-10 – 2024-01-12 (×4): 60 mg via ORAL
  Filled 2024-01-10 (×4): qty 2

## 2024-01-10 MED ORDER — METHOCARBAMOL 500 MG PO TABS
750.0000 mg | ORAL_TABLET | Freq: Three times a day (TID) | ORAL | Status: DC | PRN
  Administered 2024-01-11 (×2): 750 mg via ORAL
  Filled 2024-01-10 (×2): qty 2

## 2024-01-10 MED ORDER — ASPIRIN 325 MG PO TABS
325.0000 mg | ORAL_TABLET | Freq: Every day | ORAL | Status: DC
  Administered 2024-01-11 – 2024-01-12 (×2): 325 mg via ORAL
  Filled 2024-01-10 (×2): qty 1

## 2024-01-10 MED ORDER — ALLOPURINOL 100 MG PO TABS
100.0000 mg | ORAL_TABLET | Freq: Every day | ORAL | Status: DC
  Administered 2024-01-11 – 2024-01-12 (×2): 100 mg via ORAL
  Filled 2024-01-10 (×2): qty 1

## 2024-01-10 MED ORDER — OXYCODONE-ACETAMINOPHEN 5-325 MG PO TABS
1.0000 | ORAL_TABLET | ORAL | Status: DC | PRN
  Administered 2024-01-10 – 2024-01-12 (×9): 1 via ORAL
  Filled 2024-01-10 (×9): qty 1

## 2024-01-10 MED ORDER — HYDROMORPHONE HCL 1 MG/ML IJ SOLN
1.0000 mg | Freq: Once | INTRAMUSCULAR | Status: AC
  Administered 2024-01-10: 1 mg via INTRAVENOUS
  Filled 2024-01-10: qty 1

## 2024-01-10 MED ORDER — LINACLOTIDE 145 MCG PO CAPS
145.0000 ug | ORAL_CAPSULE | Freq: Every day | ORAL | Status: DC
  Administered 2024-01-11 – 2024-01-12 (×2): 145 ug via ORAL
  Filled 2024-01-10 (×2): qty 1

## 2024-01-10 MED ORDER — IPRATROPIUM-ALBUTEROL 0.5-2.5 (3) MG/3ML IN SOLN
3.0000 mL | Freq: Two times a day (BID) | RESPIRATORY_TRACT | Status: DC
  Administered 2024-01-11: 3 mL via RESPIRATORY_TRACT
  Filled 2024-01-10: qty 3

## 2024-01-10 MED ORDER — POLYETHYLENE GLYCOL 3350 17 G PO PACK: 17.0000 g | PACK | Freq: Every day | ORAL | Status: DC | PRN

## 2024-01-10 MED ORDER — URSODIOL 300 MG PO CAPS
300.0000 mg | ORAL_CAPSULE | Freq: Three times a day (TID) | ORAL | Status: DC
  Administered 2024-01-10 – 2024-01-12 (×5): 300 mg via ORAL
  Filled 2024-01-10 (×6): qty 1

## 2024-01-10 MED ORDER — TRAMADOL HCL 50 MG PO TABS
50.0000 mg | ORAL_TABLET | Freq: Four times a day (QID) | ORAL | Status: DC | PRN
  Administered 2024-01-11 (×2): 50 mg via ORAL
  Filled 2024-01-10 (×2): qty 1

## 2024-01-10 MED ORDER — METHYLPREDNISOLONE SODIUM SUCC 125 MG IJ SOLR
125.0000 mg | Freq: Once | INTRAMUSCULAR | Status: AC
  Administered 2024-01-10: 125 mg via INTRAVENOUS
  Filled 2024-01-10: qty 2

## 2024-01-10 MED ORDER — LACTULOSE 10 GM/15ML PO SOLN
20.0000 g | Freq: Every day | ORAL | Status: DC | PRN
  Administered 2024-01-11: 20 g via ORAL
  Filled 2024-01-10: qty 30

## 2024-01-10 MED ORDER — IPRATROPIUM-ALBUTEROL 0.5-2.5 (3) MG/3ML IN SOLN
3.0000 mL | Freq: Four times a day (QID) | RESPIRATORY_TRACT | Status: DC
  Administered 2024-01-10 (×2): 3 mL via RESPIRATORY_TRACT
  Filled 2024-01-10 (×2): qty 3

## 2024-01-10 MED ORDER — TRAZODONE HCL 50 MG PO TABS
150.0000 mg | ORAL_TABLET | Freq: Every day | ORAL | Status: DC
  Administered 2024-01-10 – 2024-01-11 (×2): 150 mg via ORAL
  Filled 2024-01-10 (×2): qty 1

## 2024-01-10 MED ORDER — ALUM & MAG HYDROXIDE-SIMETH 200-200-20 MG/5ML PO SUSP: 15.0000 mL | Freq: Three times a day (TID) | ORAL | Status: DC | PRN

## 2024-01-10 MED ORDER — POLYVINYL ALCOHOL 1.4 % OP SOLN
1.0000 [drp] | Freq: Every day | OPHTHALMIC | Status: DC
  Administered 2024-01-10 – 2024-01-11 (×2): 1 [drp] via OPHTHALMIC
  Filled 2024-01-10: qty 15

## 2024-01-10 MED ORDER — SENNA 8.6 MG PO TABS
2.0000 | ORAL_TABLET | Freq: Two times a day (BID) | ORAL | Status: DC
  Administered 2024-01-10 – 2024-01-12 (×4): 17.2 mg via ORAL
  Filled 2024-01-10 (×4): qty 2

## 2024-01-10 MED ORDER — VITAMIN D3 25 MCG (1000 UNIT) PO TABS
1000.0000 [IU] | ORAL_TABLET | Freq: Every day | ORAL | Status: DC
  Administered 2024-01-11 – 2024-01-12 (×2): 1000 [IU] via ORAL
  Filled 2024-01-10 (×4): qty 1

## 2024-01-10 MED ORDER — CLONAZEPAM 1 MG PO TABS
1.0000 mg | ORAL_TABLET | Freq: Two times a day (BID) | ORAL | Status: DC
  Administered 2024-01-10 – 2024-01-12 (×4): 1 mg via ORAL
  Filled 2024-01-10 (×4): qty 1

## 2024-01-10 MED ORDER — NALOXEGOL OXALATE 25 MG PO TABS: 25.0000 mg | ORAL_TABLET | Freq: Every day | ORAL | Status: DC

## 2024-01-10 MED ORDER — IPRATROPIUM-ALBUTEROL 0.5-2.5 (3) MG/3ML IN SOLN
3.0000 mL | Freq: Once | RESPIRATORY_TRACT | Status: AC
  Administered 2024-01-10: 3 mL via RESPIRATORY_TRACT
  Filled 2024-01-10: qty 3

## 2024-01-10 NOTE — Discharge Instructions (Addendum)
Follow-up with PCP.  Return for additional questions or concerns. °

## 2024-01-10 NOTE — Assessment & Plan Note (Signed)
 Patient initially presented with reports of leg swelling and BNP was found to be elevated.  On examination, there is no evidence of pitting edema nor pulmonary edema.  No indication at this time for diuretics.

## 2024-01-10 NOTE — Assessment & Plan Note (Addendum)
 Patient presenting with unrelated leg pain, found to be hypoxic down to 88-89% with wheezing plus rhonchi on examination.  Potentially viral given recent travel.  CTA with no evidence of PE or pneumonia.  Hypercapnia with pH on the lower end of normal, however no increased work of breathing and close to baseline, so likely chronic.  - Continue supplemental oxygen to maintain oxygen saturation above 88% - Wean as tolerated - S/p Solu-Medrol 125 mg once - Start prednisone 40 mg tomorrow to complete a 5-day course - RVP pending - DuoNebs every 6 hours

## 2024-01-10 NOTE — Assessment & Plan Note (Signed)
 -  Continue home regimen

## 2024-01-10 NOTE — Assessment & Plan Note (Addendum)
 Based on my previous admission with patient, her baseline is oriented to self with ability to answer some questions appropriately with 1-2 words.  She is close to her baseline at this time.  - Continue home regimen
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
## 2024-01-10 NOTE — ED Triage Notes (Signed)
 [facility] reports pt coming from [facility] for leg swelling that started yesterday. Pt just returned from facility yesterday as well. Pt right side deficits, asphasia from previous stroke, staff states pt is currently at baseline.

## 2024-01-10 NOTE — ED Provider Notes (Addendum)
[facility] Provider Note    Event Date/Time   First MD Initiated Contact with Patient 01/10/24 [phone]     (approximate)   History   Leg Swelling   HPI  Gloria Aguilar is a 53 y.o. female history of CVA presents to the ER for evaluation of reported leg swelling and discomfort from [facility].  Reportedly does not wear oxygen and was found to be 88% on room air but EMS reports that staff at [facility] did not know a whole lot about her.  Patient reportedly recently returned to [facility] after visiting family.  There is no report of any falls.  She denies any chest pain.     Physical Exam   Triage Vital Signs: ED Triage Vitals  Encounter Vitals Group     BP 01/10/24 0906 133/79     Systolic BP Percentile --      Diastolic BP Percentile --      Pulse Rate 01/10/24 0906 72     Resp 01/10/24 0906 20     Temp 01/10/24 0906 97.7 F (36.5 C)     Temp Source 01/10/24 0906 Oral     SpO2 01/10/24 0906 (!) 89 %     Weight --      Height --      Head Circumference --      Peak Flow --      Pain Score 01/10/24 0903 10     Pain Loc --      Pain Education --      Exclude from Growth Chart --     Most recent vital signs: Vitals:   01/10/24 1130 01/10/24 1259  BP: 130/77   Pulse: 75   Resp: 19   Temp:  (!) 97.5 F (36.4 C)  SpO2: 96%      Constitutional: Alert  Eyes: Conjunctivae are normal.  Head: Atraumatic. Nose: No congestion/rhinnorhea. Mouth/Throat: Mucous membranes are moist.   Neck: Painless ROM.  Cardiovascular:   Good peripheral circulation. Respiratory: Mild tachypnea with coarse bibasilar breath sounds.  Does have acute hypoxia requiring supplemental oxygen by nasal cannula. Gastrointestinal: Soft and nontender.  Musculoskeletal: Left lower extremity is shortened as compared to the right.  Trace bilateral edema.  Good peripheral perfusion.  Compartments are soft.    ED Results / Procedures / Treatments   Labs (all labs  ordered are listed, but only abnormal results are displayed) Labs Reviewed  COMPREHENSIVE METABOLIC PANEL - Abnormal; Notable for the following components:      Result Value   Potassium 3.4 (*)    Calcium 8.8 (*)    All other components within normal limits  BRAIN NATRIURETIC PEPTIDE - Abnormal; Notable for the following components:   B Natriuretic Peptide 290.5 (*)    All other components within normal limits  BLOOD GAS, VENOUS - Abnormal; Notable for the following components:   pCO2, Ven 63 (*)    Bicarbonate 31.7 (*)    Acid-Base Excess 3.6 (*)    All other components within normal limits  RESP PANEL BY RT-PCR (RSV, FLU A&B, COVID)  [id number]  CBC WITH DIFFERENTIAL/PLATELET  TROPONIN I (HIGH SENSITIVITY)  TROPONIN I (HIGH SENSITIVITY)     EKG  ED ECG REPORT I, Willy Eddy, the attending physician, personally viewed and interpreted this ECG.   Date: 01/10/2024  EKG Time: 10:18  Rate: 65  Rhythm: sinus  Axis: normal  Intervals: normal  ST&T Change: no stemi    RADIOLOGY Please see  ED Course for my review and interpretation.  I personally reviewed all radiographic images ordered to evaluate for the above acute complaints and reviewed radiology reports and findings.  These findings were personally discussed with the patient.  Please see medical record for radiology report.    PROCEDURES:  Critical Care performed: Yes, see critical care procedure note(s)  .Critical Care  Performed by: Willy Eddy, MD Authorized by: Willy Eddy, MD   Critical care provider statement:    Critical care time (minutes):  35   Critical care was necessary to treat or prevent imminent or life-threatening deterioration of the following conditions:  Respiratory failure   Critical care was time spent personally by me on the following activities:  Ordering and performing treatments and interventions, ordering and review of laboratory studies, ordering and review of radiographic  studies, pulse oximetry, re-evaluation of patient's condition, review of old charts, obtaining history from patient or surrogate, examination of patient, evaluation of patient's response to treatment, discussions with primary provider, discussions with consultants and development of treatment plan with patient or surrogate    MEDICATIONS ORDERED IN ED: Medications  methylPREDNISolone sodium succinate (SOLU-MEDROL) 125 mg/2 mL injection 125 mg (has no administration in time range)  ipratropium-albuterol (DUONEB) 0.5-2.5 (3) MG/3ML nebulizer solution 3 mL (has no administration in time range)  iohexol (OMNIPAQUE) 350 MG/ML injection 75 mL (75 mLs Intravenous Contrast Given 01/10/24 1153)  ipratropium-albuterol (DUONEB) 0.5-2.5 (3) MG/3ML nebulizer solution 3 mL (3 mLs Nebulization Given 01/10/24 1248)     IMPRESSION / MDM / ASSESSMENT AND PLAN / ED COURSE  I reviewed the triage vital signs and the nursing notes.                              Differential diagnosis includes, but is not limited to, fracture, contusion, dislocation, DVT, CHF, pneumonia  Patient presenting to the ER for evaluation of symptoms as described above.  Based on symptoms, risk factors and considered above differential, this presenting complaint could reflect a potentially life-threatening illness therefore the patient will be placed on continuous pulse oximetry and telemetry for monitoring.  Laboratory evaluation will be sent to evaluate for the above complaints.      Clinical Course as of 01/10/24 1419  Tue Jan 10, 2024  1130 Patient is requiring nasal cannula 2 L.  Troponin negative.  Given immobility and limited lower extremity duplex will order CTA to rule out PE. [PR]  1258 CTA on my review and interpretation without evidence of large saddle embolism.  Per radiology no acute findings. [PR]  1416 Patient does not wear home oxygen.  Spoke with daughter who states that patient has been staying with her friend and  caregiver and they smoke quite a lot.  Likely having some COPD exacerbation.  Having her acute hypoxia requiring supplemental oxygen will give IV Solu-Medrol as well as additional nebulizer treatments and consult hospitalist for admission. [PR]    Clinical Course User Index [PR] Willy Eddy, MD     FINAL CLINICAL IMPRESSION(S) / ED DIAGNOSES   Final diagnoses:  Leg pain, bilateral  Acute respiratory failure with hypoxia (HCC)     Rx / DC Orders   ED Discharge Orders     None        Note:  This document was prepared using Dragon voice recognition software and may include unintentional dictation errors.    Willy Eddy, MD 01/10/24 1419    Willy Eddy, MD  01/10/24 1419  

## 2024-01-10 NOTE — Progress Notes (Signed)
 Contacted by RN caring for the pt. Informed pt was having unrelieved pain and was crying,and takes scheduled pain medication at home. Had attempted to contact provider Dr. Huel Cote for the past hour. I secure chatted provider at [phone] about the concern. She responded immediately and explained she had informed staff she was waiting for the verification process to be completed and was currently in the process of ordering the pt medications. I called the 2C charge RN Trinna Post and informed him of what the situation was. Noted as was completing this documentation, there were orders entered for the patient including pain medication. Appears Orders need to be acknowledged by the RN staff.

## 2024-01-11 ENCOUNTER — Observation Stay: Payer: Medicaid Other

## 2024-01-11 DIAGNOSIS — Z6830 Body mass index (BMI) 30.0-30.9, adult: Secondary | ICD-10-CM | POA: Diagnosis not present

## 2024-01-11 DIAGNOSIS — Z87891 Personal history of nicotine dependence: Secondary | ICD-10-CM | POA: Diagnosis not present

## 2024-01-11 DIAGNOSIS — Z1152 Encounter for screening for COVID-19: Secondary | ICD-10-CM | POA: Diagnosis not present

## 2024-01-11 DIAGNOSIS — Z888 Allergy status to other drugs, medicaments and biological substances status: Secondary | ICD-10-CM | POA: Diagnosis not present

## 2024-01-11 DIAGNOSIS — I69320 Aphasia following cerebral infarction: Secondary | ICD-10-CM | POA: Diagnosis not present

## 2024-01-11 DIAGNOSIS — I69351 Hemiplegia and hemiparesis following cerebral infarction affecting right dominant side: Secondary | ICD-10-CM | POA: Diagnosis not present

## 2024-01-11 DIAGNOSIS — Z87820 Personal history of traumatic brain injury: Secondary | ICD-10-CM | POA: Diagnosis not present

## 2024-01-11 DIAGNOSIS — E669 Obesity, unspecified: Secondary | ICD-10-CM | POA: Diagnosis present

## 2024-01-11 DIAGNOSIS — M1990 Unspecified osteoarthritis, unspecified site: Secondary | ICD-10-CM | POA: Diagnosis present

## 2024-01-11 DIAGNOSIS — Z881 Allergy status to other antibiotic agents status: Secondary | ICD-10-CM | POA: Diagnosis not present

## 2024-01-11 DIAGNOSIS — F418 Other specified anxiety disorders: Secondary | ICD-10-CM | POA: Diagnosis present

## 2024-01-11 DIAGNOSIS — I10 Essential (primary) hypertension: Secondary | ICD-10-CM | POA: Diagnosis present

## 2024-01-11 DIAGNOSIS — Z8673 Personal history of transient ischemic attack (TIA), and cerebral infarction without residual deficits: Secondary | ICD-10-CM | POA: Diagnosis not present

## 2024-01-11 DIAGNOSIS — M79604 Pain in right leg: Secondary | ICD-10-CM | POA: Diagnosis present

## 2024-01-11 DIAGNOSIS — K589 Irritable bowel syndrome without diarrhea: Secondary | ICD-10-CM | POA: Diagnosis present

## 2024-01-11 DIAGNOSIS — J9601 Acute respiratory failure with hypoxia: Secondary | ICD-10-CM | POA: Diagnosis present

## 2024-01-11 DIAGNOSIS — Z79899 Other long term (current) drug therapy: Secondary | ICD-10-CM | POA: Diagnosis not present

## 2024-01-11 DIAGNOSIS — J441 Chronic obstructive pulmonary disease with (acute) exacerbation: Secondary | ICD-10-CM | POA: Diagnosis present

## 2024-01-11 DIAGNOSIS — Z7982 Long term (current) use of aspirin: Secondary | ICD-10-CM | POA: Diagnosis not present

## 2024-01-11 LAB — BASIC METABOLIC PANEL
Anion gap: 11 (ref 5–15)
BUN: 13 mg/dL (ref 6–20)
CO2: 25 mmol/L (ref 22–32)
Calcium: 8.9 mg/dL (ref 8.9–10.3)
Chloride: 103 mmol/L (ref 98–111)
Creatinine, Ser: 0.63 mg/dL (ref 0.44–1.00)
GFR, Estimated: >60 mL/min (ref >60)
Glucose, Bld: 163 mg/dL — ABNORMAL HIGH (ref 70–99)
Potassium: 3.5 mmol/L (ref 3.5–5.1)
Sodium: 139 mmol/L (ref 135–145)

## 2024-01-11 LAB — HIV ANTIBODY (ROUTINE TESTING W REFLEX): HIV Screen 4th Generation wRfx: NONREACTIVE

## 2024-01-11 MED ORDER — IPRATROPIUM-ALBUTEROL 0.5-2.5 (3) MG/3ML IN SOLN: 3.0000 mL | RESPIRATORY_TRACT | Status: DC | PRN
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
## 2024-01-11 NOTE — Plan of Care (Signed)
  Problem: Clinical Measurements: Goal: Respiratory complications will improve Outcome: Progressing   Problem: Pain Managment: Goal: General experience of comfort will improve and/or be controlled Outcome: Progressing   Problem: Safety: Goal: Ability to remain free from injury will improve Outcome: Progressing   Problem: Skin Integrity: Goal: Risk for impaired skin integrity will decrease Outcome: Progressing
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
## 2024-01-11 NOTE — Plan of Care (Signed)
  Problem: Health Behavior/Discharge Planning: Goal: Ability to manage health-related needs will improve Outcome: Progressing   Problem: Clinical Measurements: Goal: Will remain free from infection Outcome: Progressing Goal: Diagnostic test results will improve Outcome: Progressing Goal: Cardiovascular complication will be avoided Outcome: Progressing   Problem: Elimination: Goal: Will not experience complications related to urinary retention Outcome: Progressing

## 2024-01-11 NOTE — Progress Notes (Signed)
 Progress Note    Gloria Aguilar  ZOX:[id number] DOB: 1971/06/07  DOA: 01/10/2024 PCP: Joana Reamer, DO      Brief Narrative:    Medical records reviewed and are as summarized below:  Gloria Aguilar is a 53 y.o. 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  History limited due to patient's baseline altered mental status in the setting of TBI.    The facility reported that patient returned to the facility and able to admission after staying with a significant other for 1 week.  She began to experience leg pain on arrival back home.  No reports of shortness of breath.  In the ED, oxygen saturation was found to be 88% on room air.    Assessment/Plan:   Principal Problem:   COPD with acute exacerbation (HCC) Active Problems:   Elevated brain natriuretic peptide (BNP) level   Traumatic brain injury (HCC)   Depression with anxiety   Essential (primary) hypertension   There is no height or weight on file to calculate BMI.   Acute COPD exacerbation: Continue prednisone and bronchodilators.   Acute hypoxic respiratory failure: She has been weaned off of room air.   Abdominal pain: No vomiting.  Abdominal x-ray showed nonobstructive bowel pattern.  There was evidence of scattered gas and stool in the colon.  Continue laxatives as needed for constipation.   Right foot pain: X-ray of the right foot showed mild degenerative changes.  Analgesics as needed for pain.   Comorbidities include hypertension, depression, anxiety, TBI and stroke with chronic expressive aphasia and right hemiparesis          Diet Order             Diet regular Room service appropriate? Yes; Fluid consistency: Thin  Diet effective now                             Consultants: None  Procedures: None    Medications:    acetaminophen  1,000 mg Oral Q8H   allopurinol  100 mg Oral Daily   aspirin  325 mg Oral Daily   busPIRone  10 mg Oral Q1200   busPIRone  5 mg Oral BID   cholecalciferol  1,000 Units Oral Daily   clonazePAM  1 mg Oral BID   dicyclomine  10 mg Oral TID AC & HS   DULoxetine  60 mg Oral BID   enoxaparin (LOVENOX) injection  40 mg Subcutaneous Q24H   gabapentin  600 mg Oral TID   ipratropium-albuterol  3 mL Nebulization BID   linaclotide  145 mcg Oral Daily   polyvinyl alcohol  1 drop Both Eyes QHS   predniSONE  40 mg Oral Q breakfast   senna  2 tablet Oral BID   sodium chloride flush  3 mL Intravenous Q12H   traZODone  150 mg Oral QHS   traZODone  25 mg Oral QHS   ursodiol  300 mg Oral TID   Continuous Infusions:   Anti-infectives (From admission, onward)    None              Family Communication/Anticipated D/C date and plan/Code Status   DVT prophylaxis: enoxaparin (LOVENOX) injection 40 mg Start: 01/10/24 2000     Code Status: Full Code  Family Communication: Plan discussed with [name] (daughter) and Susann Givens (friend and POA) Disposition Plan: Plan to discharge to SNF   Status is: Observation The patient will require care spanning > 2 midnights and should be moved to inpatient because: COPD exacerbation, abdominal pain       Subjective:   Interval events noted.  She complains of abdominal pain and right foot pain.  According to [name], patient had a "blowout" last night.  She said she moved her bowels last night.  Patient feels that she is constipated today.  No vomiting, shortness of breath or chest pain.  [name] (daughter) and Susann Givens (friend and POA) were at the bedside.  Physical therapists were also at the bedside.  Objective:    Vitals:   01/11/24 0325 01/11/24 0742 01/11/24 0804 01/11/24 1051  BP: (!) 136/92 121/71    Pulse: 90 85    Resp: 18 20     Temp: 98 F (36.7 C) 98.1 F (36.7 C)    TempSrc:  Oral    SpO2: 94% 96% 94% 96%   No data found.   Intake/Output Summary (Last 24 hours) at 01/11/2024 1109 Last data filed at 01/11/2024 1036 Gross per 24 hour  Intake 606 ml  Output --  Net 606 ml   Filed Weights    Exam:  GEN: NAD SKIN: Warm and dry EYES: No pallor or icterus ENT: MMM CV: RRR PULM: CTA B ABD: soft, obese, NT, +BS CNS: AAO x 1 (person), slurred speech and right hemiparesis (RUE-1/5, RLE-3/5) at baseline EXT: No edema or tenderness       Data Reviewed:   I have personally reviewed following labs and imaging studies:  Labs: Labs show the following:   Basic Metabolic Panel: Recent Labs  Lab 01/10/24 0930 01/11/24 0411  NA 138 139  K 3.4* 3.5  CL 105 103  CO2 23 25  GLUCOSE 96 163*  BUN 17 13  CREATININE 0.61 0.63  CALCIUM 8.8* 8.9   GFR CrCl cannot be calculated (Unknown ideal weight.). Liver Function Tests: Recent Labs  Lab 01/10/24 0930  AST 23  ALT 24  ALKPHOS 108  BILITOT 0.6  PROT 6.9  ALBUMIN 3.5   No results for input(s): "LIPASE", "AMYLASE" in the last 168 hours. No results for input(s): "AMMONIA" in the last 168 hours. Coagulation profile No results for input(s): "INR", "PROTIME" in the last 168 hours.  CBC: Recent Labs  Lab 01/10/24 0930  WBC 4.1  NEUTROABS 2.4  HGB 13.5  HCT 39.3  MCV 90.1  PLT 181   Cardiac Enzymes: No results for input(s): "CKTOTAL", "CKMB", "CKMBINDEX", "TROPONINI" in the last 168 hours. BNP (last 3 results) No results for input(s): "PROBNP" in the last 8760 hours. CBG: No results for input(s): "GLUCAP" in the last 168 hours. D-Dimer: No results for input(s): "DDIMER" in the last 72 hours. Hgb A1c: No results for input(s): "HGBA1C" in the last 72 hours. Lipid Profile: No results for input(s): "CHOL", "HDL", "LDLCALC", "TRIG", "CHOLHDL", "LDLDIRECT" in the last 72 hours. Thyroid function studies: No results for input(s):  "TSH", "T4TOTAL", "T3FREE", "THYROIDAB" in the last 72 hours.  Invalid input(s): "FREET3" Anemia work up: No results for input(s): "VITAMINB12", "FOLATE", "FERRITIN", "TIBC", "IRON", "RETICCTPCT" in the last 72 hours. Sepsis Labs: Recent Labs  Lab 01/10/24 0930  WBC 4.1    Microbiology Recent Results (from the past 240 hours)  Resp panel by RT-PCR (RSV, Flu A&B, Covid) Anterior Nasal Swab     Status: None  Collection Time: 01/10/24  9:15 AM   Specimen: Anterior Nasal Swab  Result Value Ref Range Status   SARS Coronavirus 2 by RT PCR NEGATIVE NEGATIVE Final    Comment: (NOTE) SARS-CoV-2 target nucleic acids are NOT DETECTED.  The SARS-CoV-2 RNA is generally detectable in upper respiratory specimens during the acute phase of infection. The lowest concentration of SARS-CoV-2 viral copies this assay can detect is 138 copies/mL. A negative result does not preclude SARS-Cov-2 infection and should not be used as the sole basis for treatment or other patient management decisions. A negative result may occur with  improper specimen collection/handling, submission of specimen other than nasopharyngeal swab, presence of viral mutation(s) within the areas targeted by this assay, and inadequate number of viral copies(<138 copies/mL). A negative result must be combined with clinical observations, patient history, and epidemiological information. The expected result is Negative.  Fact Sheet for Patients:  BloggerCourse.com  Fact Sheet for Healthcare Providers:  SeriousBroker.it  This test is no t yet approved or cleared by the [place] FDA and  has been authorized for detection and/or diagnosis of SARS-CoV-2 by FDA under an Emergency Use Authorization (EUA). This EUA will remain  in effect (meaning this test can be used) for the duration of the COVID-19 declaration under Section 564(b)(1) of the Act, 21 U.S.C.section  360bbb-3(b)(1), unless the authorization is terminated  or revoked sooner.       Influenza A by PCR NEGATIVE NEGATIVE Final   Influenza B by PCR NEGATIVE NEGATIVE Final    Comment: (NOTE) The Xpert Xpress SARS-CoV-2/FLU/RSV plus assay is intended as an aid in the diagnosis of influenza from Nasopharyngeal swab specimens and should not be used as a sole basis for treatment. Nasal washings and aspirates are unacceptable for Xpert Xpress SARS-CoV-2/FLU/RSV testing.  Fact Sheet for Patients: BloggerCourse.com  Fact Sheet for Healthcare Providers: SeriousBroker.it  This test is not yet approved or cleared by the [place] FDA and has been authorized for detection and/or diagnosis of SARS-CoV-2 by FDA under an Emergency Use Authorization (EUA). This EUA will remain in effect (meaning this test can be used) for the duration of the COVID-19 declaration under Section 564(b)(1) of the Act, 21 U.S.C. section 360bbb-3(b)(1), unless the authorization is terminated or revoked.     Resp Syncytial Virus by PCR NEGATIVE NEGATIVE Final    Comment: (NOTE) Fact Sheet for Patients: BloggerCourse.com  Fact Sheet for Healthcare Providers: SeriousBroker.it  This test is not yet approved or cleared by the [place] FDA and has been authorized for detection and/or diagnosis of SARS-CoV-2 by FDA under an Emergency Use Authorization (EUA). This EUA will remain in effect (meaning this test can be used) for the duration of the COVID-19 declaration under Section 564(b)(1) of the Act, 21 U.S.C. section 360bbb-3(b)(1), unless the authorization is terminated or revoked.  Performed at [facility], [address] [place], [place] [address]   Respiratory (~20 pathogens) panel by PCR     Status: None   Collection Time: 01/10/24  3:32 PM   Specimen: Nasopharyngeal Swab; Respiratory   Result Value Ref Range Status   Adenovirus NOT DETECTED NOT DETECTED Final   Coronavirus 229E NOT DETECTED NOT DETECTED Final    Comment: (NOTE) The Coronavirus on the Respiratory Panel, DOES NOT test for the novel  Coronavirus (2019 nCoV)    Coronavirus HKU1 NOT DETECTED NOT DETECTED Final   Coronavirus NL63 NOT DETECTED NOT DETECTED Final   Coronavirus OC43 NOT DETECTED NOT DETECTED Final  Metapneumovirus NOT DETECTED NOT DETECTED Final   Rhinovirus / Enterovirus NOT DETECTED NOT DETECTED Final   Influenza A NOT DETECTED NOT DETECTED Final   Influenza B NOT DETECTED NOT DETECTED Final   Parainfluenza Virus 1 NOT DETECTED NOT DETECTED Final   Parainfluenza Virus 2 NOT DETECTED NOT DETECTED Final   Parainfluenza Virus 3 NOT DETECTED NOT DETECTED Final   Parainfluenza Virus 4 NOT DETECTED NOT DETECTED Final   Respiratory Syncytial Virus NOT DETECTED NOT DETECTED Final   Bordetella pertussis NOT DETECTED NOT DETECTED Final   Bordetella Parapertussis NOT DETECTED NOT DETECTED Final   Chlamydophila pneumoniae NOT DETECTED NOT DETECTED Final   Mycoplasma pneumoniae NOT DETECTED NOT DETECTED Final    Comment: Performed at [facility], [address] [place], [place] [address]    Procedures and diagnostic studies:  CT Angio Chest PE W and/or Wo Contrast Result Date: 01/10/2024 CLINICAL DATA:  Pulmonary embolism (PE) suspected, high prob. Leg swelling. EXAM: CT ANGIOGRAPHY CHEST WITH CONTRAST TECHNIQUE: Multidetector CT imaging of the chest was performed using the standard protocol during bolus administration of intravenous contrast. Multiplanar CT image reconstructions and MIPs were obtained to evaluate the vascular anatomy. RADIATION DOSE REDUCTION: This exam was performed according to the departmental dose-optimization program which includes automated exposure control, adjustment of the mA and/or kV according to patient size and/or use of iterative reconstruction technique.  CONTRAST:  75mL OMNIPAQUE IOHEXOL 350 MG/ML SOLN COMPARISON:  CT angiography chest from 10/31/2022. FINDINGS: Cardiovascular: No evidence of embolism to the proximal subsegmental pulmonary artery level. Normal cardiac size. No pericardial effusion. No aortic aneurysm. Mediastinum/Nodes: Visualized thyroid gland appears grossly unremarkable. No solid / cystic mediastinal masses. The esophagus is nondistended precluding optimal assessment. No axillary, mediastinal or hilar lymphadenopathy by size criteria. Lungs/Pleura: The central tracheo-bronchial tree is patent. There are patchy areas of linear, plate-like atelectasis and/or scarring throughout bilateral lungs. Minimal centrilobular emphysematous changes noted predominantly in the upper lobes. No mass or consolidation. No pleural effusion or pneumothorax. No suspicious lung nodules. Upper Abdomen: Visualized upper abdominal viscera within normal limits. Musculoskeletal: Bilateral breast implants noted. The visualized soft tissues of the chest wall are grossly unremarkable. No suspicious osseous lesions. There are mild multilevel degenerative changes in the visualized spine. Review of the MIP images confirms the above findings. IMPRESSION: 1. No embolism to the proximal subsegmental pulmonary artery level. 2. No lung mass, consolidation, pleural effusion or pneumothorax. 3. Multiple other nonacute observations, as described above. Emphysema (ICD10-J43.9). Electronically Signed   By: Jules Schick M.D.   On: 01/10/2024 12:29   DG Knee Complete 4 Views Right Result Date: 01/10/2024 CLINICAL DATA:  leg pain, eval for fracture.  Right knee pain. EXAM: RIGHT KNEE - COMPLETE 4+ VIEW COMPARISON:  None Available. FINDINGS: No acute fracture or dislocation. No aggressive osseous lesion. Intramedullary nail and fixation screws noted in the distal right femur. There are degenerative changes of the knee joint in the form of mildly reduced medial tibio-femoral compartment  joint space, tibial spiking and osteophytosis. No knee effusion or focal soft tissue swelling. No radiopaque foreign bodies. IMPRESSION: *No acute osseous abnormality of the right knee joint. *Mild degenerative changes of the right knee joint. Electronically Signed   By: Jules Schick M.D.   On: 01/10/2024 12:23   DG Knee Complete 4 Views Left Result Date: 01/10/2024 CLINICAL DATA:  leg pain, eval for fracture.  Bilateral knee pain. EXAM: LEFT KNEE - COMPLETE 4+ VIEW COMPARISON:  07/24/2023. FINDINGS: No acute  fracture or dislocation. No aggressive osseous lesion. Redemonstration of deformity of the proximal tibia with mixed sclerotic/lytic areas within. There is old healed lateral tibial plateau fracture. Findings likely represent sequela of prior trauma. There are also stable ghost tracks within the distal femur. There are postsurgical changes from prior ACL reconstruction. Redemonstration of dislocation of knee joint. No knee effusion or focal soft tissue swelling. No radiopaque foreign bodies. IMPRESSION: *No acute osseous abnormality of the left knee joint. Electronically Signed   By: Jules Schick M.D.   On: 01/10/2024 12:21   DG Hip Unilat W or Wo Pelvis 2-3 Views Left Result Date: 01/10/2024 CLINICAL DATA:  leg swelling EXAM: DG HIP (WITH OR WITHOUT PELVIS) 2-3V LEFT COMPARISON:  CT AP, 10/31/2022.  CT hip, 01/24/2023. FINDINGS: Postoperative changes of BILATERAL proximal femoral fixation. No periprosthetic lucency or displaced fracture. There is no evidence of hip dislocation. There is no evidence of arthropathy or other focal bone abnormality. IMPRESSION: BILATERAL proximal femoral fixation. No acute displaced fracture or dislocation within the pelvis and hips. Electronically Signed   By: Roanna Banning M.D.   On: 01/10/2024 10:47   DG Chest Portable 1 View Result Date: 01/10/2024 CLINICAL DATA:  leg swelling EXAM: PORTABLE CHEST 1 VIEW COMPARISON:  Chest XR, 02/01/2023.  CTA chest, 10/31/2022.  FINDINGS: Cardiomegaly, unchanged. Lungs are mildly hypoinflated. No focal consolidation or mass. No pleural effusion or pneumothorax. No acute displaced fracture. IMPRESSION: Cardiomegaly and mild hypoinflation. No acute superimposed cardiopulmonary process. Electronically Signed   By: Roanna Banning M.D.   On: 01/10/2024 10:42   US Venous Img Lower Bilateral Result Date: 01/10/2024 CLINICAL DATA:  leg swelling pt coming from [facility] for leg swelling that started yesterday. Pt just returned from facility yesterday as well EXAM: BILATERAL LOWER EXTREMITY VENOUS DOPPLER ULTRASOUND TECHNIQUE: Gray-scale sonography with graded compression, as well as color Doppler and duplex ultrasound were performed to evaluate the lower extremity deep venous systems from the level of the common femoral vein and including the common femoral, femoral, profunda femoral, popliteal and calf veins including the posterior tibial, peroneal and gastrocnemius veins when visible. The superficial great saphenous vein was also interrogated. Spectral Doppler was utilized to evaluate flow at rest and with distal augmentation maneuvers in the common femoral, femoral and popliteal veins. COMPARISON:  Hip XRs, concurrent. CT AP and lower extremity venous duplex, 10/31/2022. FINDINGS: RIGHT LOWER EXTREMITY VENOUS Normal compressibility of the RIGHT common femoral, superficial femoral, and popliteal veins, as well as the visualized calf veins. Visualized portions of profunda femoral vein and great saphenous vein unremarkable. No filling defects to suggest DVT on grayscale or color Doppler imaging. Doppler waveforms show normal direction of venous flow, normal respiratory plasticity and response to augmentation. OTHER No evidence of superficial thrombophlebitis or abnormal fluid collection. Limitations: Patient body habitus LEFT LOWER EXTREMITY VENOUS Normal compressibility of the LEFT common femoral, superficial femoral, and popliteal veins, as  well as the visualized calf veins. Visualized portions of profunda femoral vein and great saphenous vein unremarkable. No filling defects to suggest DVT on grayscale or color Doppler imaging. Doppler waveforms show normal direction of venous flow, normal respiratory plasticity and response to augmentation. OTHER No evidence of superficial thrombophlebitis or abnormal fluid collection. Limitations: Patient body habitus IMPRESSION: Suboptimal evaluation, within these constraints; No evidence of femoropopliteal DVT or superficial thrombophlebitis within either lower extremity. Roanna Banning, MD [facility] Electronically Signed   By: Roanna Banning M.D.   On: 01/10/2024  10:40               LOS: 0 days   Jett Fukuda  [facility]   Pager on [email or web address]. If 7PM-7AM, please contact night-coverage at [email or web address]     01/11/2024, 11:09 AM
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
## 2024-01-11 NOTE — Plan of Care (Signed)
  Problem: Education: Goal: Knowledge of General Education information will improve Description: Including pain rating scale, medication(s)/side effects and non-pharmacologic comfort measures Outcome: Progressing   Problem: Activity: Goal: Risk for activity intolerance will decrease Outcome: Progressing   Problem: Nutrition: Goal: Adequate nutrition will be maintained Outcome: Progressing   Problem: Coping: Goal: Level of anxiety will decrease Outcome: Progressing   Problem: Elimination: Goal: Will not experience complications related to bowel motility Outcome: Progressing Goal: Will not experience complications related to urinary retention Outcome: Progressing   Problem: Pain Managment: Goal: General experience of comfort will improve and/or be controlled Outcome: Progressing   Problem: Safety: Goal: Ability to remain free from injury will improve Outcome: Progressing

## 2024-01-12 DIAGNOSIS — J441 Chronic obstructive pulmonary disease with (acute) exacerbation: Secondary | ICD-10-CM | POA: Diagnosis not present

## 2024-01-12 MED ORDER — CLONAZEPAM 1 MG PO TABS: 1.0000 mg | ORAL_TABLET | Freq: Two times a day (BID) | ORAL | 0 refills | Status: AC

## 2024-01-12 MED ORDER — OXYCODONE-ACETAMINOPHEN 5-325 MG PO TABS: 1.0000 | ORAL_TABLET | ORAL | 0 refills | Status: AC | PRN

## 2024-01-12 MED ORDER — PREDNISONE 20 MG PO TABS: 40.0000 mg | ORAL_TABLET | Freq: Every day | ORAL | Status: AC

## 2024-01-12 NOTE — Plan of Care (Signed)

## 2024-01-12 NOTE — Discharge Summary (Signed)
 Physician Discharge Summary   Patient: Gloria Aguilar MRN: [id number] DOB: 06-10-1971  Admit date:     01/10/2024  Discharge date: 01/12/24  Discharge Physician: Lurene Shadow   PCP: Joana Reamer, DO   Recommendations at discharge:   Follow-up with physician at the nursing home within 3 days of discharge   Discharge Diagnoses: Principal Problem:   COPD with acute exacerbation (HCC) Active Problems:   Elevated brain natriuretic peptide (BNP) level   Traumatic brain injury (HCC)   Depression with anxiety   Essential (primary) hypertension  Resolved Problems:   * No resolved hospital problems. *  Hospital Course:  Gloria Aguilar is a 53 y.o. 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  History limited due to patient's baseline altered mental status in the setting of TBI.     The facility reported that patient returned to the facility and able to admission after staying with a significant other for 1 week.  She began to experience leg pain on arrival back home.  No reports of shortness of breath.  In the ED, oxygen saturation was found to be 88% on room air.  Assessment and Plan:  Acute COPD exacerbation: Improved.  Continue prednisone for 2 more days.  Continue bronchodilators as needed.     Acute hypoxic respiratory failure: Improved.  She is tolerating room air.     Abdominal pain: Improved.  Abdominal x-ray showed nonobstructive bowel pattern.  There was evidence of scattered gas and stool in the colon.  Continue laxatives as needed for constipation.     Right foot pain: X-ray of the right foot showed mild degenerative changes.  Analgesics as needed for pain.     Comorbidities include hypertension, depression, anxiety, TBI and stroke with chronic expressive aphasia and right  hemiparesis   Her condition has improved and she is deemed stable for discharge to SNF today. Discharge plan was discussed with Mr. Isaac Bliss, [place], over the phone.      Pain control - [employer] Controlled Substance Reporting System database was reviewed. and patient was instructed, not to drive, operate heavy machinery, perform activities at heights, swimming or participation in water activities or provide baby-sitting services while on Pain, Sleep and Anxiety Medications; until their outpatient Physician has advised to do so again. Also recommended to not to take more than prescribed Pain, Sleep and Anxiety Medications.  Consultants: None Procedures performed: None  Disposition: Skilled nursing facility Diet recommendation:  Discharge Diet Orders (From admission, onward)     Start     Ordered   01/12/24 0000  Diet - low sodium heart healthy        01/12/24 1227           Cardiac diet DISCHARGE MEDICATION: Allergies as of 01/12/2024       Reactions   Cephalexin Hives   Has tolerated 1st generation cephalosporin (CEFAZOLIN) on several occasions without documented ADRs.   Levetiracetam Itching   Wound Dressing Adhesive Rash        Medication List     STOP taking these medications    Nyamyc powder Generic drug: nystatin   simethicone 80 MG chewable tablet Commonly known as: MYLICON   traMADol 50 MG tablet Commonly known as: ULTRAM       TAKE these medications    acetaminophen 500 MG tablet  Commonly known as: TYLENOL Take 2 tablets (1,000 mg total) by mouth every 8 (eight) hours.   albuterol 108 (90 Base) MCG/ACT inhaler Commonly known as: VENTOLIN HFA Inhale 2 puffs into the lungs every 4 (four) hours as needed for wheezing or shortness of breath.   allopurinol 100 MG tablet Commonly known as: ZYLOPRIM Take 100 mg by mouth daily.   alum & mag hydroxide-simeth 200-200-20 MG/5ML suspension Commonly known as: MAALOX/MYLANTA Take 15 mLs by mouth  every 8 (eight) hours as needed for indigestion or heartburn.   aspirin 325 MG tablet Take 325 mg by mouth daily. What changed: Another medication with the same name was removed. Continue taking this medication, and follow the directions you see here.   busPIRone 15 MG tablet Commonly known as: BUSPAR Take 5 mg by mouth 2 (two) times daily.   busPIRone 10 MG tablet Commonly known as: BUSPAR Take 10 mg by mouth daily at 12 noon.   Cholecalciferol 25 MCG (1000 UT) tablet Take 1,000 Units by mouth daily.   clonazePAM 1 MG tablet Commonly known as: KLONOPIN Take 1 tablet (1 mg total) by mouth 2 (two) times daily.   diclofenac Sodium 1 % Gel Commonly known as: VOLTAREN Apply 2 g topically 2 (two) times daily.   dicyclomine 10 MG capsule Commonly known as: BENTYL Take 1 capsule (10 mg total) by mouth 4 (four) times daily -  before meals and at bedtime.   diphenhydrAMINE 25 MG tablet Commonly known as: BENADRYL Take 25 mg by mouth 2 (two) times daily as needed.   DULoxetine 60 MG capsule Commonly known as: CYMBALTA Take 60 mg by mouth 2 (two) times daily.   gabapentin 600 MG tablet Commonly known as: NEURONTIN Take 600 mg by mouth 3 (three) times daily.   hydrOXYzine 25 MG tablet Commonly known as: ATARAX Take 25 mg by mouth every 8 (eight) hours as needed for itching.   ibuprofen 600 MG tablet Commonly known as: ADVIL Take 1 tablet (600 mg total) by mouth every 8 (eight) hours as needed for moderate pain.   ipratropium-albuterol 0.5-2.5 (3) MG/3ML Soln Commonly known as: DUONEB Take 3 mLs by nebulization every 6 (six) hours as needed (wheezing).   lactulose 10 GM/15ML solution Commonly known as: CHRONULAC Take 30 mLs by mouth daily as needed for mild constipation or moderate constipation.   Linzess 145 MCG Caps capsule Generic drug: linaclotide Take 145 mcg by mouth daily.   loratadine 10 MG tablet Commonly known as: CLARITIN Take 10 mg by mouth daily.    methocarbamol 750 MG tablet Commonly known as: ROBAXIN Take 1 tablet (750 mg total) by mouth every 8 (eight) hours as needed for muscle spasms.   multivitamin with minerals tablet Take 1 tablet by mouth daily.   naloxegol oxalate 25 MG Tabs tablet Commonly known as: MOVANTIK Take 1 tablet (25 mg total) by mouth daily.   naloxone 4 MG/0.1ML Liqd nasal spray kit Commonly known as: NARCAN Place 1 spray into the nose as needed.   oxyCODONE-acetaminophen 5-325 MG tablet Commonly known as: Percocet Take 1 tablet by mouth every 4 (four) hours as needed for severe pain (pain score 7-10).   polyethylene glycol 17 g packet Commonly known as: MIRALAX / GLYCOLAX Take 17 g by mouth daily as needed (constipation > 48 hours despite Movantik).   polyvinyl alcohol 1.4 % ophthalmic solution Commonly known as: LIQUIFILM TEARS Place 1 drop into both eyes at bedtime.   predniSONE 20 MG tablet Commonly known  as: DELTASONE Take 2 tablets (40 mg total) by mouth daily with breakfast for 2 days. Start taking on: January 13, 2024   senna 8.6 MG Tabs tablet Commonly known as: SENOKOT Take 2 tablets by mouth 2 (two) times daily.   traZODone 50 MG tablet Commonly known as: DESYREL Take 25 mg by mouth at bedtime.   traZODone 150 MG tablet Commonly known as: DESYREL Take 150 mg by mouth at bedtime.   ursodiol 300 MG capsule Commonly known as: ACTIGALL Take 1 capsule (300 mg total) by mouth 3 (three) times daily.        Follow-up Information     Mullis, Kiersten P, DO.   Specialty: Family Medicine Contact information: [address] [place] [address] [phone]                Discharge Exam: Filed Weights   GEN: NAD SKIN: Warm and dry EYES: No pallor or icterus ENT: MMM CV: RRR PULM: CTA B ABD: soft, obese, NT, +BS CNS: AAO x 2 (person and place), slurred speech, right hemiparesis EXT: No edema or tenderness   Condition at discharge: good  The results  of significant diagnostics from this hospitalization (including imaging, microbiology, ancillary and laboratory) are listed below for reference.   Imaging Studies: DG Foot 2 Views Right Result Date: 01/11/2024 CLINICAL DATA:  Right foot pain.  Leg swelling. EXAM: RIGHT FOOT - 2 VIEW COMPARISON:  06/18/2023 FINDINGS: Mild degenerative changes in the interphalangeal and intertarsal joints. No evidence of acute fracture or dislocation. No focal bone lesion or bone destruction. Soft tissues are unremarkable. IMPRESSION: Mild degenerative changes.  No acute displaced fractures identified. Electronically Signed   By: Burman Nieves M.D.   On: 01/11/2024 16:03   DG Abd 1 View Result Date: 01/11/2024 CLINICAL DATA:  Abdominal pain.  Leg swelling and pain. EXAM: ABDOMEN - 1 VIEW COMPARISON:  CT abdomen and pelvis 10/31/2022 FINDINGS: Scattered gas and stool in the colon. Gas-filled nondistended stomach. No small or large bowel distention. No radiopaque stones. Degenerative changes in the spine. Postoperative changes in both hips. IMPRESSION: Nonobstructive bowel gas pattern. Electronically Signed   By: Burman Nieves M.D.   On: 01/11/2024 16:02   CT Angio Chest PE W and/or Wo Contrast Result Date: 01/10/2024 CLINICAL DATA:  Pulmonary embolism (PE) suspected, high prob. Leg swelling. EXAM: CT ANGIOGRAPHY CHEST WITH CONTRAST TECHNIQUE: Multidetector CT imaging of the chest was performed using the standard protocol during bolus administration of intravenous contrast. Multiplanar CT image reconstructions and MIPs were obtained to evaluate the vascular anatomy. RADIATION DOSE REDUCTION: This exam was performed according to the departmental dose-optimization program which includes automated exposure control, adjustment of the mA and/or kV according to patient size and/or use of iterative reconstruction technique. CONTRAST:  75mL OMNIPAQUE IOHEXOL 350 MG/ML SOLN COMPARISON:  CT angiography chest from 10/31/2022.  FINDINGS: Cardiovascular: No evidence of embolism to the proximal subsegmental pulmonary artery level. Normal cardiac size. No pericardial effusion. No aortic aneurysm. Mediastinum/Nodes: Visualized thyroid gland appears grossly unremarkable. No solid / cystic mediastinal masses. The esophagus is nondistended precluding optimal assessment. No axillary, mediastinal or hilar lymphadenopathy by size criteria. Lungs/Pleura: The central tracheo-bronchial tree is patent. There are patchy areas of linear, plate-like atelectasis and/or scarring throughout bilateral lungs. Minimal centrilobular emphysematous changes noted predominantly in the upper lobes. No mass or consolidation. No pleural effusion or pneumothorax. No suspicious lung nodules. Upper Abdomen: Visualized upper abdominal viscera within normal limits. Musculoskeletal: Bilateral breast implants noted.  The visualized soft tissues of the chest wall are grossly unremarkable. No suspicious osseous lesions. There are mild multilevel degenerative changes in the visualized spine. Review of the MIP images confirms the above findings. IMPRESSION: 1. No embolism to the proximal subsegmental pulmonary artery level. 2. No lung mass, consolidation, pleural effusion or pneumothorax. 3. Multiple other nonacute observations, as described above. Emphysema (ICD10-J43.9). Electronically Signed   By: Jules Schick M.D.   On: 01/10/2024 12:29   DG Knee Complete 4 Views Right Result Date: 01/10/2024 CLINICAL DATA:  leg pain, eval for fracture.  Right knee pain. EXAM: RIGHT KNEE - COMPLETE 4+ VIEW COMPARISON:  None Available. FINDINGS: No acute fracture or dislocation. No aggressive osseous lesion. Intramedullary nail and fixation screws noted in the distal right femur. There are degenerative changes of the knee joint in the form of mildly reduced medial tibio-femoral compartment joint space, tibial spiking and osteophytosis. No knee effusion or focal soft tissue swelling. No  radiopaque foreign bodies. IMPRESSION: *No acute osseous abnormality of the right knee joint. *Mild degenerative changes of the right knee joint. Electronically Signed   By: Jules Schick M.D.   On: 01/10/2024 12:23   DG Knee Complete 4 Views Left Result Date: 01/10/2024 CLINICAL DATA:  leg pain, eval for fracture.  Bilateral knee pain. EXAM: LEFT KNEE - COMPLETE 4+ VIEW COMPARISON:  07/24/2023. FINDINGS: No acute fracture or dislocation. No aggressive osseous lesion. Redemonstration of deformity of the proximal tibia with mixed sclerotic/lytic areas within. There is old healed lateral tibial plateau fracture. Findings likely represent sequela of prior trauma. There are also stable ghost tracks within the distal femur. There are postsurgical changes from prior ACL reconstruction. Redemonstration of dislocation of knee joint. No knee effusion or focal soft tissue swelling. No radiopaque foreign bodies. IMPRESSION: *No acute osseous abnormality of the left knee joint. Electronically Signed   By: Jules Schick M.D.   On: 01/10/2024 12:21   DG Hip Unilat W or Wo Pelvis 2-3 Views Left Result Date: 01/10/2024 CLINICAL DATA:  leg swelling EXAM: DG HIP (WITH OR WITHOUT PELVIS) 2-3V LEFT COMPARISON:  CT AP, 10/31/2022.  CT hip, 01/24/2023. FINDINGS: Postoperative changes of BILATERAL proximal femoral fixation. No periprosthetic lucency or displaced fracture. There is no evidence of hip dislocation. There is no evidence of arthropathy or other focal bone abnormality. IMPRESSION: BILATERAL proximal femoral fixation. No acute displaced fracture or dislocation within the pelvis and hips. Electronically Signed   By: Roanna Banning M.D.   On: 01/10/2024 10:47   DG Chest Portable 1 View Result Date: 01/10/2024 CLINICAL DATA:  leg swelling EXAM: PORTABLE CHEST 1 VIEW COMPARISON:  Chest XR, 02/01/2023.  CTA chest, 10/31/2022. FINDINGS: Cardiomegaly, unchanged. Lungs are mildly hypoinflated. No focal consolidation or mass. No  pleural effusion or pneumothorax. No acute displaced fracture. IMPRESSION: Cardiomegaly and mild hypoinflation. No acute superimposed cardiopulmonary process. Electronically Signed   By: Roanna Banning M.D.   On: 01/10/2024 10:42   US Venous Img Lower Bilateral Result Date: 01/10/2024 CLINICAL DATA:  leg swelling pt coming from [facility] for leg swelling that started yesterday. Pt just returned from facility yesterday as well EXAM: BILATERAL LOWER EXTREMITY VENOUS DOPPLER ULTRASOUND TECHNIQUE: Gray-scale sonography with graded compression, as well as color Doppler and duplex ultrasound were performed to evaluate the lower extremity deep venous systems from the level of the common femoral vein and including the common femoral, femoral, profunda femoral, popliteal and calf veins including the posterior tibial, peroneal and gastrocnemius veins when  visible. The superficial great saphenous vein was also interrogated. Spectral Doppler was utilized to evaluate flow at rest and with distal augmentation maneuvers in the common femoral, femoral and popliteal veins. COMPARISON:  Hip XRs, concurrent. CT AP and lower extremity venous duplex, 10/31/2022. FINDINGS: RIGHT LOWER EXTREMITY VENOUS Normal compressibility of the RIGHT common femoral, superficial femoral, and popliteal veins, as well as the visualized calf veins. Visualized portions of profunda femoral vein and great saphenous vein unremarkable. No filling defects to suggest DVT on grayscale or color Doppler imaging. Doppler waveforms show normal direction of venous flow, normal respiratory plasticity and response to augmentation. OTHER No evidence of superficial thrombophlebitis or abnormal fluid collection. Limitations: Patient body habitus LEFT LOWER EXTREMITY VENOUS Normal compressibility of the LEFT common femoral, superficial femoral, and popliteal veins, as well as the visualized calf veins. Visualized portions of profunda femoral vein and great saphenous  vein unremarkable. No filling defects to suggest DVT on grayscale or color Doppler imaging. Doppler waveforms show normal direction of venous flow, normal respiratory plasticity and response to augmentation. OTHER No evidence of superficial thrombophlebitis or abnormal fluid collection. Limitations: Patient body habitus IMPRESSION: Suboptimal evaluation, within these constraints; No evidence of femoropopliteal DVT or superficial thrombophlebitis within either lower extremity. Roanna Banning, MD [facility] Electronically Signed   By: Roanna Banning M.D.   On: 01/10/2024 10:40    Microbiology: Results for orders placed or performed during the hospital encounter of 01/10/24  Resp panel by RT-PCR (RSV, Flu A&B, Covid) Anterior Nasal Swab     Status: None   Collection Time: 01/10/24  9:15 AM   Specimen: Anterior Nasal Swab  Result Value Ref Range Status   SARS Coronavirus 2 by RT PCR NEGATIVE NEGATIVE Final    Comment: (NOTE) SARS-CoV-2 target nucleic acids are NOT DETECTED.  The SARS-CoV-2 RNA is generally detectable in upper respiratory specimens during the acute phase of infection. The lowest concentration of SARS-CoV-2 viral copies this assay can detect is 138 copies/mL. A negative result does not preclude SARS-Cov-2 infection and should not be used as the sole basis for treatment or other patient management decisions. A negative result may occur with  improper specimen collection/handling, submission of specimen other than nasopharyngeal swab, presence of viral mutation(s) within the areas targeted by this assay, and inadequate number of viral copies(<138 copies/mL). A negative result must be combined with clinical observations, patient history, and epidemiological information. The expected result is Negative.  Fact Sheet for Patients:  BloggerCourse.com  Fact Sheet for Healthcare Providers:   SeriousBroker.it  This test is no t yet approved or cleared by the [place] FDA and  has been authorized for detection and/or diagnosis of SARS-CoV-2 by FDA under an Emergency Use Authorization (EUA). This EUA will remain  in effect (meaning this test can be used) for the duration of the COVID-19 declaration under Section 564(b)(1) of the Act, 21 U.S.C.section 360bbb-3(b)(1), unless the authorization is terminated  or revoked sooner.       Influenza A by PCR NEGATIVE NEGATIVE Final   Influenza B by PCR NEGATIVE NEGATIVE Final    Comment: (NOTE) The Xpert Xpress SARS-CoV-2/FLU/RSV plus assay is intended as an aid in the diagnosis of influenza from Nasopharyngeal swab specimens and should not be used as a sole basis for treatment. Nasal washings and aspirates are unacceptable for Xpert Xpress SARS-CoV-2/FLU/RSV testing.  Fact Sheet for Patients: BloggerCourse.com  Fact Sheet for Healthcare Providers: SeriousBroker.it  This test is not yet  approved or cleared by the [place] and has been authorized for detection and/or diagnosis of SARS-CoV-2 by FDA under an Emergency Use Authorization (EUA). This EUA will remain in effect (meaning this test can be used) for the duration of the COVID-19 declaration under Section 564(b)(1) of the Act, 21 U.S.C. section 360bbb-3(b)(1), unless the authorization is terminated or revoked.     Resp Syncytial Virus by PCR NEGATIVE NEGATIVE Final    Comment: (NOTE) Fact Sheet for Patients: BloggerCourse.com  Fact Sheet for Healthcare Providers: SeriousBroker.it  This test is not yet approved or cleared by the [place] FDA and has been authorized for detection and/or diagnosis of SARS-CoV-2 by FDA under an Emergency Use Authorization (EUA). This EUA will remain in effect (meaning this test can be used) for  the duration of the COVID-19 declaration under Section 564(b)(1) of the Act, 21 U.S.C. section 360bbb-3(b)(1), unless the authorization is terminated or revoked.  Performed at [facility], [address] [place], [place] [address]   Respiratory (~20 pathogens) panel by PCR     Status: None   Collection Time: 01/10/24  3:32 PM   Specimen: Nasopharyngeal Swab; Respiratory  Result Value Ref Range Status   Adenovirus NOT DETECTED NOT DETECTED Final   Coronavirus 229E NOT DETECTED NOT DETECTED Final    Comment: (NOTE) The Coronavirus on the Respiratory Panel, DOES NOT test for the novel  Coronavirus (2019 nCoV)    Coronavirus HKU1 NOT DETECTED NOT DETECTED Final   Coronavirus NL63 NOT DETECTED NOT DETECTED Final   Coronavirus OC43 NOT DETECTED NOT DETECTED Final   Metapneumovirus NOT DETECTED NOT DETECTED Final   Rhinovirus / Enterovirus NOT DETECTED NOT DETECTED Final   Influenza A NOT DETECTED NOT DETECTED Final   Influenza B NOT DETECTED NOT DETECTED Final   Parainfluenza Virus 1 NOT DETECTED NOT DETECTED Final   Parainfluenza Virus 2 NOT DETECTED NOT DETECTED Final   Parainfluenza Virus 3 NOT DETECTED NOT DETECTED Final   Parainfluenza Virus 4 NOT DETECTED NOT DETECTED Final   Respiratory Syncytial Virus NOT DETECTED NOT DETECTED Final   Bordetella pertussis NOT DETECTED NOT DETECTED Final   Bordetella Parapertussis NOT DETECTED NOT DETECTED Final   Chlamydophila pneumoniae NOT DETECTED NOT DETECTED Final   Mycoplasma pneumoniae NOT DETECTED NOT DETECTED Final    Comment: Performed at [facility], [address], [place], [place] [address]    Labs: CBC: Recent Labs  Lab 01/10/24 0930  WBC 4.1  NEUTROABS 2.4  HGB 13.5  HCT 39.3  MCV 90.1  PLT 181   Basic Metabolic Panel: Recent Labs  Lab 01/10/24 0930 01/11/24 0411  NA 138 139  K 3.4* 3.5  CL 105 103  CO2 23 25  GLUCOSE 96 163*  BUN 17 13  CREATININE 0.61 0.63  CALCIUM 8.8* 8.9   Liver  Function Tests: Recent Labs  Lab 01/10/24 0930  AST 23  ALT 24  ALKPHOS 108  BILITOT 0.6  PROT 6.9  ALBUMIN 3.5   CBG: No results for input(s): "GLUCAP" in the last 168 hours.  Discharge time spent: greater than 30 minutes.  Signed: Lurene Shadow, MD [facility] 01/12/2024

## 2024-01-12 NOTE — TOC Progression Note (Signed)
 Transition of Care [facility]) - Progression Note    Patient Details  Name: Gloria Aguilar MRN: [id number] Date of Birth: [birth year]  [facility] CM/SW Contact  Chapman Fitch, RN Phone Number: 01/12/2024, 10:04 AM  Clinical Narrative:    Message sent to MD to determine when it is anticipated patient will medically be ready to return to LTC at [facility]         Expected Discharge Plan and Services                                               Social Determinants of Health (SDOH) Interventions SDOH Screenings   Food Insecurity: No Food Insecurity (01/10/2024)  Housing: High Risk (01/10/2024)  Transportation Needs: No Transportation Needs (01/10/2024)  Utilities: Not At Risk (01/10/2024)  Financial Resource Strain: Low Risk  (11/14/2023)   Received from [facility]  Physical Activity: Inactive (09/28/2021)   Received from [facility], [facility]  Social Connections: Socially Isolated (09/28/2021)   Received from [facility], [facility]  Stress: No Stress Concern Present (09/28/2021)   Received from [facility], [facility]  Tobacco Use: Medium Risk (01/10/2024)    Readmission Risk Interventions     No data to display

## 2024-01-12 NOTE — TOC Transition Note (Signed)
 Transition of Care [facility]) - Discharge Note   Patient Details  Name: Gloria Aguilar MRN: [id number] Date of Birth: [birth year]  Transition of Care [facility] CM/SW Contact:  Chapman Fitch, RN Phone Number: 01/12/2024, 12:53 PM   Clinical Narrative:      Patient will DC to: [facility] Anticipated DC date:01/12/24  Family notified: MD notified Mr Gloria Aguilar  Transport by:Mr Gloria Aguilar   Per MD patient ready for DC to . RN, , patient's friend, and facility notified of DC. Discharge Summary sent to facility. RN given number for report. DC packet on chart. TOC signing off.        Patient Goals and CMS Choice            Discharge Placement                       Discharge Plan and Services Additional resources added to the After Visit Summary for                                       Social Drivers of Health (SDOH) Interventions SDOH Screenings   Food Insecurity: No Food Insecurity (01/10/2024)  Housing: High Risk (01/10/2024)  Transportation Needs: No Transportation Needs (01/10/2024)  Utilities: Not At Risk (01/10/2024)  Financial Resource Strain: Low Risk  (11/14/2023)   Received from [facility]  Physical Activity: Inactive (09/28/2021)   Received from [facility], [facility]  Social Connections: Socially Isolated (09/28/2021)   Received from [facility], [facility]  Stress: No Stress Concern Present (09/28/2021)   Received [facility], [facility]  Tobacco Use: Medium Risk (01/10/2024)     Readmission Risk Interventions     No data to display

## 2024-03-22 ENCOUNTER — Other Ambulatory Visit (HOSPITAL_COMMUNITY): Payer: Self-pay | Admitting: Family Medicine

## 2024-03-22 DIAGNOSIS — Z720 Tobacco use: Secondary | ICD-10-CM

## 2024-03-22 DIAGNOSIS — Z1231 Encounter for screening mammogram for malignant neoplasm of breast: Secondary | ICD-10-CM

## 2024-04-04 ENCOUNTER — Ambulatory Visit (HOSPITAL_COMMUNITY)

## 2024-04-12 ENCOUNTER — Ambulatory Visit (HOSPITAL_COMMUNITY)

## 2024-04-12 ENCOUNTER — Ambulatory Visit (HOSPITAL_COMMUNITY)
Admission: RE | Admit: 2024-04-12 | Discharge: 2024-04-12 | Discharge status: Home / Self Care | Source: Ambulatory Visit | Attending: Family Medicine

## 2024-04-12 ENCOUNTER — Other Ambulatory Visit (HOSPITAL_COMMUNITY): Payer: Self-pay | Admitting: Family Medicine

## 2024-04-12 ENCOUNTER — Ambulatory Visit (HOSPITAL_COMMUNITY)
Admission: RE | Admit: 2024-04-12 | Discharge: 2024-04-12 | Disposition: A | Discharge status: Home / Self Care | Source: Ambulatory Visit | Attending: Family Medicine | Admitting: Family Medicine

## 2024-04-12 ENCOUNTER — Encounter (HOSPITAL_COMMUNITY): Payer: Self-pay

## 2024-04-12 DIAGNOSIS — Z720 Tobacco use: Secondary | ICD-10-CM | POA: Insufficient documentation

## 2024-04-12 DIAGNOSIS — Z1231 Encounter for screening mammogram for malignant neoplasm of breast: Secondary | ICD-10-CM | POA: Diagnosis present
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
## 2024-09-10 ENCOUNTER — Ambulatory Visit (HOSPITAL_COMMUNITY): Attending: Family Medicine

## 2024-09-10 ENCOUNTER — Encounter (HOSPITAL_COMMUNITY): Payer: Self-pay

## 2024-09-10 ENCOUNTER — Other Ambulatory Visit: Payer: Self-pay

## 2024-09-10 DIAGNOSIS — I6930 Unspecified sequelae of cerebral infarction: Secondary | ICD-10-CM | POA: Diagnosis present

## 2024-09-10 DIAGNOSIS — Z96652 Presence of left artificial knee joint: Secondary | ICD-10-CM | POA: Diagnosis present

## 2024-09-10 DIAGNOSIS — R531 Weakness: Secondary | ICD-10-CM | POA: Insufficient documentation

## 2024-09-10 DIAGNOSIS — Z7409 Other reduced mobility: Secondary | ICD-10-CM | POA: Diagnosis present

## 2024-09-13 ENCOUNTER — Ambulatory Visit (HOSPITAL_COMMUNITY)

## 2024-09-19 ENCOUNTER — Ambulatory Visit (HOSPITAL_COMMUNITY)

## 2024-09-21 ENCOUNTER — Ambulatory Visit (HOSPITAL_COMMUNITY)

## 2024-09-25 ENCOUNTER — Ambulatory Visit (HOSPITAL_COMMUNITY): Admitting: Physical Therapy

## 2024-09-27 ENCOUNTER — Encounter (HOSPITAL_COMMUNITY): Payer: Self-pay

## 2024-09-27 ENCOUNTER — Ambulatory Visit (HOSPITAL_COMMUNITY): Attending: Family Medicine

## 2024-09-27 DIAGNOSIS — Z96652 Presence of left artificial knee joint: Secondary | ICD-10-CM | POA: Insufficient documentation

## 2024-09-27 DIAGNOSIS — I6930 Unspecified sequelae of cerebral infarction: Secondary | ICD-10-CM | POA: Diagnosis present

## 2024-09-27 DIAGNOSIS — R531 Weakness: Secondary | ICD-10-CM | POA: Insufficient documentation

## 2024-09-27 DIAGNOSIS — Z7409 Other reduced mobility: Secondary | ICD-10-CM | POA: Insufficient documentation
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
## 2024-10-01 ENCOUNTER — Encounter (HOSPITAL_COMMUNITY): Payer: Self-pay

## 2024-10-01 ENCOUNTER — Ambulatory Visit (HOSPITAL_COMMUNITY)

## 2024-10-01 DIAGNOSIS — I6930 Unspecified sequelae of cerebral infarction: Secondary | ICD-10-CM

## 2024-10-01 DIAGNOSIS — R531 Weakness: Secondary | ICD-10-CM

## 2024-10-01 DIAGNOSIS — Z7409 Other reduced mobility: Secondary | ICD-10-CM

## 2024-10-01 DIAGNOSIS — Z96652 Presence of left artificial knee joint: Secondary | ICD-10-CM
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
## 2024-10-04 ENCOUNTER — Encounter (HOSPITAL_COMMUNITY): Payer: Self-pay

## 2024-10-04 ENCOUNTER — Ambulatory Visit (HOSPITAL_COMMUNITY)

## 2024-10-04 DIAGNOSIS — Z7409 Other reduced mobility: Secondary | ICD-10-CM

## 2024-10-04 DIAGNOSIS — R531 Weakness: Secondary | ICD-10-CM

## 2024-10-04 DIAGNOSIS — Z96652 Presence of left artificial knee joint: Secondary | ICD-10-CM

## 2024-10-04 DIAGNOSIS — I6930 Unspecified sequelae of cerebral infarction: Secondary | ICD-10-CM
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
## 2024-10-08 ENCOUNTER — Ambulatory Visit (HOSPITAL_COMMUNITY)

## 2024-10-10 ENCOUNTER — Encounter (HOSPITAL_COMMUNITY): Payer: Self-pay

## 2024-10-10 ENCOUNTER — Ambulatory Visit (HOSPITAL_COMMUNITY)

## 2024-10-10 DIAGNOSIS — Z7409 Other reduced mobility: Secondary | ICD-10-CM

## 2024-10-10 DIAGNOSIS — R531 Weakness: Secondary | ICD-10-CM | POA: Diagnosis not present

## 2024-10-10 DIAGNOSIS — Z96652 Presence of left artificial knee joint: Secondary | ICD-10-CM

## 2024-10-10 DIAGNOSIS — I6930 Unspecified sequelae of cerebral infarction: Secondary | ICD-10-CM
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
## 2024-10-15 ENCOUNTER — Ambulatory Visit (HOSPITAL_COMMUNITY)

## 2024-10-18 ENCOUNTER — Ambulatory Visit (HOSPITAL_COMMUNITY)

## 2024-10-22 ENCOUNTER — Ambulatory Visit (HOSPITAL_COMMUNITY): Admitting: Physical Therapy

## 2024-10-25 ENCOUNTER — Ambulatory Visit (HOSPITAL_COMMUNITY)

## 2024-10-29 ENCOUNTER — Ambulatory Visit (HOSPITAL_COMMUNITY)

## 2024-10-31 ENCOUNTER — Ambulatory Visit (HOSPITAL_COMMUNITY)

## 2024-11-05 ENCOUNTER — Ambulatory Visit (HOSPITAL_COMMUNITY)

## 2024-11-20 ENCOUNTER — Ambulatory Visit (HOSPITAL_COMMUNITY): Attending: Family Medicine | Admitting: Physical Therapy

## 2024-11-20 NOTE — Therapy (Signed)
 PHYSICAL THERAPY DISCHARGE SUMMARY  Visits from Start of Care: 5  Current functional level related to goals / functional outcomes: See last visit on 11/26   Remaining deficits: See last visit on 11/26   Education / Equipment: HEP   Patient agrees to discharge. Patient goals were not met. Patient is being discharged due to not returning since the last visit.  Pt and caregiver agree to be discharged and will get a new referral when patient is ready to resume therapy services.  11:33 AM, 11/20/2024 Rosaria Settler, PT, DPT [place] with [facility]
